# Patient Record
Sex: Female | Born: 1940 | Race: White | Hispanic: No | Marital: Married | Smoking: Never smoker
Health system: Southern US, Community
[De-identification: ages and names within clinical notes are randomized; demographics above are authoritative.]

## PROBLEM LIST (undated history)

## (undated) DIAGNOSIS — I739 Peripheral vascular disease, unspecified: Secondary | ICD-10-CM

## (undated) DIAGNOSIS — I421 Obstructive hypertrophic cardiomyopathy: Secondary | ICD-10-CM

## (undated) DIAGNOSIS — E039 Hypothyroidism, unspecified: Secondary | ICD-10-CM

## (undated) DIAGNOSIS — I779 Disorder of arteries and arterioles, unspecified: Secondary | ICD-10-CM

## (undated) DIAGNOSIS — E785 Hyperlipidemia, unspecified: Secondary | ICD-10-CM

## (undated) DIAGNOSIS — Z9289 Personal history of other medical treatment: Secondary | ICD-10-CM

## (undated) DIAGNOSIS — R011 Cardiac murmur, unspecified: Secondary | ICD-10-CM

## (undated) DIAGNOSIS — R413 Other amnesia: Secondary | ICD-10-CM

## (undated) DIAGNOSIS — I10 Essential (primary) hypertension: Secondary | ICD-10-CM

## (undated) DIAGNOSIS — I340 Nonrheumatic mitral (valve) insufficiency: Secondary | ICD-10-CM

## (undated) HISTORY — DX: Other amnesia: R41.3

## (undated) HISTORY — DX: Nonrheumatic mitral (valve) insufficiency: I34.0

## (undated) HISTORY — PX: COLONOSCOPY: SHX174

## (undated) HISTORY — DX: Obstructive hypertrophic cardiomyopathy: I42.1

## (undated) HISTORY — DX: Essential (primary) hypertension: I10

## (undated) HISTORY — PX: ABDOMINAL HYSTERECTOMY: SHX81

## (undated) HISTORY — PX: BREAST SURGERY: SHX581

## (undated) HISTORY — DX: Disorder of arteries and arterioles, unspecified: I77.9

## (undated) HISTORY — DX: Hyperlipidemia, unspecified: E78.5

## (undated) HISTORY — DX: Personal history of other medical treatment: Z92.89

## (undated) HISTORY — PX: BRAIN SURGERY: SHX531

## (undated) HISTORY — DX: Hypothyroidism, unspecified: E03.9

## (undated) HISTORY — PX: SINUS EXPLORATION: SHX5214

## (undated) HISTORY — DX: Peripheral vascular disease, unspecified: I73.9

---

## 1970-05-28 HISTORY — PX: OTHER SURGICAL HISTORY: SHX169

## 1999-06-18 ENCOUNTER — Encounter: Payer: Self-pay | Admitting: *Deleted

## 1999-06-18 ENCOUNTER — Emergency Department (HOSPITAL_COMMUNITY): Admission: EM | Admit: 1999-06-18 | Discharge: 1999-06-18 | Payer: Self-pay | Admitting: Emergency Medicine

## 1999-06-18 ENCOUNTER — Encounter (INDEPENDENT_AMBULATORY_CARE_PROVIDER_SITE_OTHER): Payer: Self-pay | Admitting: *Deleted

## 2000-01-10 ENCOUNTER — Encounter: Payer: Self-pay | Admitting: Internal Medicine

## 2000-01-26 ENCOUNTER — Other Ambulatory Visit: Admission: RE | Admit: 2000-01-26 | Discharge: 2000-01-26 | Payer: Self-pay | Admitting: Internal Medicine

## 2000-01-26 ENCOUNTER — Encounter (INDEPENDENT_AMBULATORY_CARE_PROVIDER_SITE_OTHER): Payer: Self-pay | Admitting: *Deleted

## 2000-01-26 ENCOUNTER — Encounter (INDEPENDENT_AMBULATORY_CARE_PROVIDER_SITE_OTHER): Payer: Self-pay

## 2000-01-26 ENCOUNTER — Encounter: Payer: Self-pay | Admitting: Internal Medicine

## 2000-04-11 ENCOUNTER — Encounter: Payer: Self-pay | Admitting: Cardiology

## 2000-04-11 ENCOUNTER — Encounter: Admission: RE | Admit: 2000-04-11 | Discharge: 2000-04-11 | Payer: Self-pay | Admitting: Cardiology

## 2004-06-09 ENCOUNTER — Other Ambulatory Visit: Admission: RE | Admit: 2004-06-09 | Discharge: 2004-06-09 | Payer: Self-pay | Admitting: Obstetrics and Gynecology

## 2004-06-27 ENCOUNTER — Encounter: Admission: RE | Admit: 2004-06-27 | Discharge: 2004-06-27 | Payer: Self-pay | Admitting: Cardiology

## 2004-09-22 ENCOUNTER — Ambulatory Visit (HOSPITAL_COMMUNITY): Admission: RE | Admit: 2004-09-22 | Discharge: 2004-09-22 | Payer: Self-pay | Admitting: Gastroenterology

## 2004-09-22 ENCOUNTER — Encounter (INDEPENDENT_AMBULATORY_CARE_PROVIDER_SITE_OTHER): Payer: Self-pay | Admitting: *Deleted

## 2006-07-04 ENCOUNTER — Encounter: Admission: RE | Admit: 2006-07-04 | Discharge: 2006-07-04 | Payer: Self-pay | Admitting: Cardiology

## 2006-10-10 HISTORY — PX: US ECHOCARDIOGRAPHY: HXRAD669

## 2007-03-06 HISTORY — PX: CARDIOVASCULAR STRESS TEST: SHX262

## 2009-07-04 ENCOUNTER — Encounter: Admission: RE | Admit: 2009-07-04 | Discharge: 2009-07-04 | Payer: Self-pay | Admitting: Cardiology

## 2009-12-13 ENCOUNTER — Encounter: Payer: Self-pay | Admitting: Internal Medicine

## 2010-01-09 ENCOUNTER — Ambulatory Visit: Payer: Self-pay | Admitting: Cardiology

## 2010-01-12 ENCOUNTER — Ambulatory Visit: Payer: Self-pay | Admitting: Cardiology

## 2010-01-16 ENCOUNTER — Encounter (INDEPENDENT_AMBULATORY_CARE_PROVIDER_SITE_OTHER): Payer: Self-pay | Admitting: *Deleted

## 2010-04-17 ENCOUNTER — Ambulatory Visit: Payer: Self-pay | Admitting: Internal Medicine

## 2010-04-17 DIAGNOSIS — E11649 Type 2 diabetes mellitus with hypoglycemia without coma: Secondary | ICD-10-CM | POA: Insufficient documentation

## 2010-04-17 DIAGNOSIS — K644 Residual hemorrhoidal skin tags: Secondary | ICD-10-CM | POA: Insufficient documentation

## 2010-04-17 DIAGNOSIS — E119 Type 2 diabetes mellitus without complications: Secondary | ICD-10-CM

## 2010-05-09 ENCOUNTER — Ambulatory Visit: Payer: Self-pay | Admitting: Internal Medicine

## 2010-05-25 ENCOUNTER — Ambulatory Visit: Payer: Self-pay | Admitting: Cardiology

## 2010-05-30 ENCOUNTER — Ambulatory Visit: Payer: Self-pay | Admitting: Cardiology

## 2010-06-29 NOTE — Op Note (Signed)
Summary: EGD-Dr. Madilyn Fireman  Tina [name], Tina [name]                 ACCOUNT NO.:  [id number]   MEDICAL RECORD NO.:  [id number]          PATIENT TYPE:  AMB   LOCATION:  ENDO                         FACILITY:  [facility]   PHYSICIAN:  John C. Madilyn Fireman, M.D.    DATE OF BIRTH:  30-Dec-1940   DATE OF PROCEDURE:  09/22/2004  DATE OF DISCHARGE:                                 OPERATIVE REPORT   PROCEDURE:  Esophagogastroduodenoscopy with esophageal dilatation.   INDICATIONS FOR PROCEDURE:  Solid food dysphagia in a patient also  undergoing screening colonoscopy today.   PROCEDURE:  The patient was placed in the left lateral decubitus position  and placed on the pulse monitor with continuous low flow oxygen delivered by  nasal cannula.  She was sedated with 60 mcg IV fentanyl and 6 mg IV Versed.  The Olympus video endoscope was advanced under direct vision into the  oropharynx and esophagus.  The esophagus was straight and of normal caliber  with the squamocolumnar line at 38 cm.  There was a widely patent but  clearly visible lower esophageal ring.  I did not discern any definite  hiatal hernia or esophageal ulcer or any evidence of Barrett's esophagus or  any significant length of stricture.  The stomach was entered, and a small  amount of liquid secretions were suctioned from the fundus.  Retroflexed  view of the cardia was unremarkable.  The fundus, body, antrum, and pylorus  all appeared normal.  The duodenum was entered.  Both bulb and second  portions were well inspected and appeared to be within normal limits.  The  Savary guidewire was placed through the endoscope channel and the scope  withdrawn.  A single Savary dilator of 17 mm was passed over the guidewire  with minimal resistance and no blood seen on withdrawal.  The dilator was  removed together with the wire and the patient prepared for colonoscopy.  She tolerated the procedure well, and there were no immediate complications.   IMPRESSION:   Lower esophageal ring, otherwise normal study.  Status post  dilatation to 17 mm.   PLAN:  Advance diet and observe response to dilatation.      JCH/MEDQ  D:  09/22/2004  T:  09/22/2004  Job:  [id number]   cc:   Miguel Aschoff, M.D.  [address], [address]  [place]  [place] [address]  Fax: [phone]

## 2010-06-29 NOTE — Procedures (Signed)
Summary: Colonoscopy  Patient: Tina [name] Note: All result statuses are Final unless otherwise noted.  Tests: (1) Colonoscopy (COL)   COL Colonoscopy           DONE     [facility]     [address]     [place], [place]  [address]          COLONOSCOPY PROCEDURE REPORT          PATIENT:  [name], Tina  MR#:  [id number]     BIRTHDATE:  12/17/1940, 69 yrs. old  GENDER:  female     ENDOSCOPIST:  John N. Perry Jr, MD     REF. BY:  Screening Recall, M.D.     PROCEDURE DATE:  05/09/2010     PROCEDURE:  Surveillance Colonoscopy     ASA CLASS:  Class II     INDICATIONS:  history of polyps, surveillance and high-risk     screening ; 2001 w/ hpp; negative 2006 (elsewhere)     MEDICATIONS:   Fentanyl 100 mcg IV, Versed 10 mg IV          DESCRIPTION OF PROCEDURE:   After the risks benefits and     alternatives of the procedure were thoroughly explained, informed     consent was obtained.  Digital rectal exam was performed and     revealed no abnormalities.   The LB 180AL [id number] endoscope was     introduced through the anus and advanced to the cecum, which was     identified by both the appendix and ileocecal valve, without     limitations.Time to cecum = 2:31 min.  The quality of the prep was     excellent, using MoviPrep.  The instrument was then slowly     withdrawn (time = 8:29 min) as the colon was fully examined.     <<PROCEDUREIMAGES>>          FINDINGS:  A normal appearing cecum, ileocecal valve, and     appendiceal orifice were identified. The ascending, hepatic     flexure, transverse, splenic flexure, descending, sigmoid colon,     and rectum appeared unremarkable.   Retroflexed views in the     rectum revealed no abnormalities.    The scope was then withdrawn     from the patient and the procedure completed.          COMPLICATIONS:  None          ENDOSCOPIC IMPRESSION:     1) Normal colonoscopy     2) No polyps seen          RECOMMENDATIONS:     1)  Continue current colorectal screening recommendations for     "routine risk" patients with a repeat colonoscopy in 10 years.     ______________________________     John N. Perry Jr, MD          CC:  Thomas Brackbill, MD;  The Patient          n.     eSIGNED:   John N. Perry Jr at 05/09/2010 04:11 PM          Encinas, Frederick, [id number]  Note: An exclamation mark (!) indicates a result that was not dispersed into the flowsheet. Document Creation Date: 05/09/2010 4:11 PM _______________________________________________________________________  (1) Order result status: Final Collection or observation date-time: 05/09/2010 16:06 Requested date-time:  Receipt date-time:  Reported date-time:  Referring Physician:   Ordering Physician: John Perry Jr (  [id number]) Specimen Source:  Source: EndoProS Filler Order Number: [id number] Lab site:   Appended Document: Colonoscopy    Clinical Lists Changes  Observations: Added new observation of COLONNXTDUE: 04/2020 (05/09/2010 16:44)     

## 2010-06-29 NOTE — Letter (Signed)
Summary: New Patient letter  [facility]  [address] [place], [place] [address]   Phone: [phone]  Fax: [phone]       01/16/2010 MRN: [id number]  Tina [name] [address] [place], [place]  [address]  [place]  Dear Ms. Yetta Barre,  Welcome to the Gastroenterology Division at [facility].    You are scheduled to see Dr. Marina Goodell on 03/02/2010 at 2:30PM on the 3rd floor at [facility], [address]  We ask that you try to arrive at our   office 15 minutes prior to your appointment time to allow for check-in.  We would like you to complete the enclosed self-administered evaluation form prior to your visit and bring it with you on the day of your appointment.  We will review it with you.  Also, please bring a complete list of all your medications or, if you prefer, bring the medication bottles and we will list them.  Please bring your insurance card so that we may make a copy of it.  If your insurance requires a referral to see a specialist, please bring your referral form from your primary care physician.  Co-payments are due at the time of your visit and may be paid by cash, check or credit card.     Your office visit will consist of a consult with your physician (includes a physical exam), any laboratory testing he/she may order, scheduling of any necessary diagnostic testing (e.g. x-ray, ultrasound, CT-scan), and scheduling of a procedure (e.g. Endoscopy, Colonoscopy) if required.  Please allow enough time on your schedule to allow for any/all of these possibilities.    If you cannot keep your appointment, please call [phone] to cancel or reschedule prior to your appointment date.  This allows [place] the opportunity to schedule an appointment for another patient in need of care.  If you do not cancel or reschedule by 5 p.m. the business day prior to your appointment date, you will be charged a $50.00 late cancellation/no-show fee.    Thank you for  choosing [facility] for your medical needs.  We appreciate the opportunity to care for you.  Please visit [place] at our website  to learn more about our practice.                     Sincerely,                                                             The Gastroenterology Division

## 2010-06-29 NOTE — Op Note (Signed)
Summary: Colonoscopy-Dr. Madilyn Fireman  NAME:  Tina [name], Tina [name]                 ACCOUNT NO.:  [id number]   MEDICAL RECORD NO.:  [id number]          PATIENT TYPE:  AMB   LOCATION:  ENDO                         FACILITY:  [facility]   PHYSICIAN:  John C. Madilyn Fireman, M.D.    DATE OF BIRTH:  [birth year]   DATE OF PROCEDURE:  09/22/2004  DATE OF DISCHARGE:                                 OPERATIVE REPORT   PROCEDURE:  Colonoscopy.   INDICATIONS FOR PROCEDURE:  Average-risk colon cancer screening.   PROCEDURE:  The patient was placed in the left lateral decubitus position  and placed on the pulse monitor with continuous low flow oxygen delivered by  nasal cannula.  She was sedated with 60 mcg IV fentanyl and 6 mg IV Versed  for the previous study and received an additional 1 mg for this procedure.  The Olympus video colonoscope was inserted into the rectum and advanced to  the cecum, confirmed by transillumination at McBurney's point and  visualization of the ileocecal valve and appendiceal orifice.  Prep is  excellent.  The cecum, ascending, transverse, descending, and sigmoid colon  all appeared normal with no masses, polyps, diverticula, or other mucosal  abnormalities.  The rectum likewise appeared normal, and retroflexed view of  the anus revealed no obvious internal hemorrhoids.  The scope was then  withdrawn, and the patient returned to the recovery room in stable  condition.  She tolerated the procedure well, and there were no immediate  complications.   IMPRESSION:  Normal study.   PLAN:  Flexible sigmoidoscopy in five years.  Colonoscopy in 10 years.      JCH/MEDQ  D:  09/22/2004  T:  09/22/2004  Job:  [id number]   cc:   Miguel Aschoff, M.D.  [address]  [place]  [place] [address]  Fax: [phone]

## 2010-06-29 NOTE — Letter (Signed)
Summary: Colonoscopy Letter  [facility]  [address] [place], [place] [address]   Phone: [phone]  Fax: [phone]      December 13, 2009 MRN: [id number]   Tina [name] [address] [place], [place]  [address]   Dear Tina [name],   According to your medical record, it is time for you to schedule a Colonoscopy. The American Cancer Society recommends this procedure as a method to detect early colon cancer. Patients with a family history of colon cancer, or a personal history of colon polyps or inflammatory bowel disease are at increased risk.  This letter has been generated based on the recommendations made at the time of your procedure. If you feel that in your particular situation this may no longer apply, please contact our office.  Please call our office at [phone] to schedule this appointment or to update your records at your earliest convenience.  Thank you for cooperating with [place] to provide you with the very best care possible.   Sincerely,  Wilhemina Bonito. Marina Goodell, M.D.  [facility] Division [phone]

## 2010-06-29 NOTE — Procedures (Signed)
Summary: colonoscopy   Colonoscopy  Procedure date:  01/26/2000  Findings:      Location:  [facility].  Results: Diverticulosis.       Pathology:  Hyperplastic polyp.      Patient Name: Tina [name], Tina [name] MRN: [id number] Procedure Procedures: Colonoscopy CPT: 04540.    with Hot Biopsy(s)CPT: Z451292.  Personnel: Endoscopist: Wilhemina Bonito. Marina Goodell, MD.  Referred By: Cassell Clement, MD.  Exam Location: Exam performed in [facility]. Outpatient  Patient Consent: Procedure, Alternatives, Risks and Benefits discussed, consent obtained, from patient.  Indications Symptoms: Back pain.  Average Risk Screening Routine.  History  Pre-Exam Physical: Performed Jan 26, 2000. Cardio-pulmonary exam, Rectal exam, HEENT exam , Abdominal exam, Extremity exam, Neurological exam WNL.  Exam Exam: Extent of exam reached: Cecum, extent intended: Terminal Ileum.  The cecum was identified by appendiceal orifice and IC valve. Patient position: on left side. Colon retroflexion performed. Images were not taken. ASA Classification: I.  Monitoring: Pulse and BP monitoring, Oximetry used. Supplemental O2 given.  Fluoroscopy: Fluoroscopy was not used.  Sedation Meds: Fentanyl 100 mcg. Versed 10 mg.  Findings DIVERTICULOSIS: Sigmoid Colon. ICD9: Diverticulosis: 562.10.  POLYP: Sigmoid Colon, diminutive, Procedure:  hot biopsy, removed, retrieved, Polyp sent to pathology. ICD9: Colon Polyps: 211.3.   Assessment  Diagnoses: 211.3: Colon Polyps.  562.10: Diverticulosis.   Events  Unplanned Interventions: No intervention was required.  Unplanned Events: There were no complications. Plans Medication Plan: Await pathology.  Patient Education: Patient given standard instructions for: Polyps. Diverticulosis.  Comments: Follow  pathology. Return to Dr. Patty Sermons re medical care and back pain. Disposition: After procedure patient sent to recovery. After recovery patient sent home.    Scheduling/Referral: Await pathology to schedule patient.   This report was created from the original endoscopy report, which was reviewed and signed by the above listed endoscopist.   cc:  Cassell Clement, MD

## 2010-06-29 NOTE — Letter (Signed)
Summary: [facility] : [facility] Instructions  [facility]  [address] [place], [place] [address]   Phone: [phone]  Fax: [phone]       Tina [name]    [birth year]    MRN: [id number]        Procedure Day /Date:TUESDAY 05/09/10     Arrival Time:2:30 PM     Procedure Time:3:30 PM     Location of Procedure:                    X  [facility] (4th Floor)               PREPARATION FOR COLONOSCOPY WITH MOVIPREP   Starting 5 days prior to your procedure12/8/11 do not eat nuts, seeds, popcorn, corn, beans, peas,  salads, or any raw vegetables.  Do not take any fiber supplements (e.g. Metamucil, Citrucel, and Benefiber).  THE DAY BEFORE YOUR PROCEDURE         DATE:05/08/10 DAY: MONDAY  1.  Drink clear liquids the entire day-NO SOLID FOOD  2.  Do not drink anything colored red or purple.  Avoid juices with pulp.  No orange juice.  3.  Drink at least 64 oz. (8 glasses) of fluid/clear liquids during the day to prevent dehydration and help the prep work efficiently.  CLEAR LIQUIDS INCLUDE: Water Jello Ice Popsicles Tea (sugar ok, no milk/cream) Powdered fruit flavored drinks Coffee (sugar ok, no milk/cream) Gatorade Juice: apple, white grape, white cranberry  Lemonade Clear bullion, consomm, broth Carbonated beverages (any kind) Strained chicken noodle soup Hard Candy                             4.  In the morning, mix first dose of MoviPrep solution:    Empty 1 Pouch A and 1 Pouch B into the disposable container    Add lukewarm drinking water to the top line of the container. Mix to dissolve    Refrigerate (mixed solution should be used within 24 hrs)  5.  Begin drinking the prep at 5:00 p.m. The MoviPrep container is divided by 4 marks.   Every 15 minutes drink the solution down to the next mark (approximately 8 oz) until the full liter is complete.   6.  Follow completed prep with 16 oz of clear liquid of your choice (Nothing red or purple).   Continue to drink clear liquids until bedtime.  7.  Before going to bed, mix second dose of MoviPrep solution:    Empty 1 Pouch A and 1 Pouch B into the disposable container    Add lukewarm drinking water to the top line of the container. Mix to dissolve    Refrigerate  THE DAY OF YOUR PROCEDURE      DATE:05/09/10 DAY: TUESDAY  Beginning at10:30 a.m. (5 hours before procedure):         1. Every 15 minutes, drink the solution down to the next mark (approx 8 oz) until the full liter is complete.  2. Follow completed prep with 16 oz. of clear liquid of your choice.    3. You may drink clear liquids until 1:30 PM (2 HOURS BEFORE PROCEDURE).   MEDICATION INSTRUCTIONS  Unless otherwise instructed, you should take regular prescription medications with a small sip of water   as early as possible the morning of your procedure.  Diabetic patients - see separate instructions.         OTHER  INSTRUCTIONS  You will need a responsible adult at least 72 years of age to accompany you and drive you home.   This person must remain in the waiting room during your procedure.  Wear loose fitting clothing that is easily removed.  Leave jewelry and other valuables at home.  However, you may wish to bring a book to read or  an iPod/MP3 player to listen to music as you wait for your procedure to start.  Remove all body piercing jewelry and leave at home.  Total time from sign-in until discharge is approximately 2-3 hours.  You should go home directly after your procedure and rest.  You can resume normal activities the  day after your procedure.  The day of your procedure you should not:   Drive   Make legal decisions   Operate machinery   Drink alcohol   Return to work  You will receive specific instructions about eating, activities and medications before you leave.    The above instructions have been reviewed and explained to me by   _______________________    I fully  understand and can verbalize these instructions _____________________________ Date _________

## 2010-06-29 NOTE — Assessment & Plan Note (Signed)
Summary: Hemorrhoids, colonoscopy followup   History of Present Illness Visit Type: Follow-up Visit Primary GI MD: Yancey Flemings MD Primary Zaria Taha: Cassell Clement, MD Chief Complaint: hemmorhoids,better with metamucil History of Present Illness:   70 year old female with hypertension, hyperlipidemia,and diabetes mellitus. She presents today regarding colonoscopy. Her index colonoscopy was performed August 2001. This was normal except for diminutive colon polyp which was removed and found to be hyperplastic. She had followup elsewhere around April 2006. This was normal. She recently has had problems with loose stools which have resolved. Also problems with her hemorrhoids as manifested by pain. She has treated this with fiber supplementation in the form of Metamucil. Review of outside blood work from August 2011 shows a hemoglobin A1c of 7.5 with normal liver tests and electrolytes. Elevated cholesterol at 215. She denies family history of colon cancer or bleeding.   GI Review of Systems      Denies abdominal pain, acid reflux, belching, bloating, chest pain, dysphagia with liquids, dysphagia with solids, heartburn, loss of appetite, nausea, vomiting, vomiting blood, weight loss, and  weight gain.      Reports hemorrhoids.     Denies anal fissure, black tarry stools, change in bowel habit, constipation, diarrhea, diverticulosis, fecal incontinence, heme positive stool, irritable bowel syndrome, jaundice, light color stool, liver problems, rectal bleeding, and  rectal pain. Preventive Screening-Counseling & Management  Alcohol-Tobacco     Smoking Status: never      Drug Use:  no.      Current Medications (verified): 1)  Losartan Potassium-Hctz 100-25 Mg Tabs (Losartan Potassium-Hctz) .... Take 1 Tablet By Mouth Once Daily 2)  Synthroid 100 Mcg Tabs (Levothyroxine Sodium) .... Take 1 Tablet By Mouth Once Daily 3)  Potassium Gluconate 2 Meq Tabs (Potassium Gluconate) .... Once Daily 4)   Glimepiride 4 Mg Tabs (Glimepiride) .... Take 1/2 Tablet By Mouth As Needed  Allergies (verified): No Known Drug Allergies  Past History:  Past Medical History: Adenomatous Colon Polyps Diabetes Hyperlipidemia Hypertension  Past Surgical History: Appendectomy Breast-Lumpectomy Hysterectomy Thyroid Surgery  Family History: Family History of Colon Polyps: Family History of Diabetes:  Family History of Heart Disease:   Social History: Occupation: Retired Patient has never smoked.  Alcohol Use - no Illicit Drug Use - no Smoking Status:  never Drug Use:  no  Review of Systems       The patient complains of headaches-new and heart murmur.  The patient denies anemia, anxiety-new, arthritis/joint pain, back pain, blood in urine, breast changes/lumps, change in vision, confusion, cough, coughing up blood, depression-new, fainting, fatigue, fever, hearing problems, heart rhythm changes, itching, menstrual pain, muscle pains/cramps, night sweats, nosebleeds, pregnancy symptoms, shortness of breath, skin rash, sleeping problems, sore throat, swelling of feet/legs, swollen lymph glands, thirst - excessive , urination - excessive , urination changes/pain, urine leakage, vision changes, and voice change.    Vital Signs:  Patient profile:   70 year old female Height:      62 inches Weight:      163.25 pounds BMI:     29.97 Pulse rate:   68 / minute Pulse rhythm:   regular BP sitting:   162 / 86  (left arm) Cuff size:   regular  Vitals Entered By: June McMurray CMA Duncan Dull) (April 17, 2010 9:29 AM)  Physical Exam  General:  Well developed, well nourished, no acute distress. Head:  Normocephalic and atraumatic. Eyes:  PERRLA, no icterus. Nose:  No deformity, discharge,  or lesions. Mouth:  No deformity or  lesions, Neck:  Supple; no masses or thyromegaly. Lungs:  Clear throughout to auscultation. Heart:  Regular rate and rhythm; no murmurs, rubs,  or bruits. Abdomen:  Soft,  obese,nontender and nondistended. No masses, hepatosplenomegaly or hernias noted. Normal bowel sounds. Rectal:  deferred until colonoscopy Msk:  Symmetrical with no gross deformities. Normal posture. Pulses:  Normal pulses noted. Extremities:  no edema Neurologic:  Alert and  oriented x4. Skin:  no jaundice Psych:  Alert and cooperative. Normal mood and affect.   Impression & Recommendations:  Problem # 1:  SCREENING COLORECTAL-CANCER (ICD-V76.51) patient presents today for followup colonoscopy. Index exam in 2001 revealing hyperplastic polyp. Followup exam in 2006 without lesions. The major colonoscopy as well as risks, benefits, and alternatives were reviewed. She understood and agreed to proceed. Movi prep prescribed. Patient instructed on its use  Problem # 2:  DM (ICD-250.00)  hold diabetic medication the day of the procedure to avoid hypoglycemia. Monitor blood sugar immediately before and after the procedure  Orders: Colonoscopy (Colon)  Problem # 3:  HEMORRHOIDS-EXTERNAL (ICD-455.3) smptomatic hemorrhoids. Reportedly better with fiber. Continue fiber. Evaluate at time of colonoscopy.  Patient Instructions: 1)  COLONOSCOPY [facility] 05/09/10 3:30 PM ARRIVE AT 2:30 PM 2)  MOVI PREP INSTRUCTIONS AND RX. SENT TO PHARMACY 3)  Colonoscopy and Flexible Sigmoidoscopy brochure given.  4)  Copy sent to : Cassell Clement, MD 5)  The medication list was reviewed and reconciled.  All changed / newly prescribed medications were explained.  A complete medication list was provided to the patient / caregiver. Prescriptions: MOVIPREP 100 GM  SOLR (PEG-KCL-NACL-NASULF-NA ASC-C) As per prep instructions.  #1 x 0   Entered by:   Milford Cage NCMA   Authorized by:   Hilarie Fredrickson MD   Signed by:   Milford Cage NCMA on 04/17/2010   Method used:   Electronically to        [facility]  [id number] ([facility]       [address]       [place], [place]  [address]       Ph:  [phone] or [phone]       Fax: [phone]   RxID:   [id number]

## 2010-06-30 NOTE — Progress Notes (Signed)
Summary: [facility]  [facility]   Imported By: Sherian Rein 04/18/2010 07:12:18  _____________________________________________________________________  External Attachment:    Type:   Image     Comment:   External Document

## 2010-08-15 NOTE — Procedures (Signed)
Summary: Colonoscopy  Patient: Tina [name] Note: All result statuses are Final unless otherwise noted.  Tests: (1) Colonoscopy (COL)   COL Colonoscopy           DONE     [facility]     [address]     [place], [place]  [address]          COLONOSCOPY PROCEDURE REPORT          PATIENT:  Tina [name], Tina [name]  MR#:  [id number]     BIRTHDATE:  1940-07-20, 69 yrs. old  GENDER:  female     ENDOSCOPIST:  Wilhemina Bonito. Eda Keys, MD     REF. BY:  Screening Recall, M.D.     PROCEDURE DATE:  05/09/2010     PROCEDURE:  Surveillance Colonoscopy     ASA CLASS:  Class II     INDICATIONS:  history of polyps, surveillance and high-risk     screening ; 2001 w/ hpp; negative 2006 (elsewhere)     MEDICATIONS:   Fentanyl 100 mcg IV, Versed 10 mg IV          DESCRIPTION OF PROCEDURE:   After the risks benefits and     alternatives of the procedure were thoroughly explained, informed     consent was obtained.  Digital rectal exam was performed and     revealed no abnormalities.   The LB 180AL [id number] endoscope was     introduced through the anus and advanced to the cecum, which was     identified by both the appendix and ileocecal valve, without     limitations.Time to cecum = 2:31 min.  The quality of the prep was     excellent, using MoviPrep.  The instrument was then slowly     withdrawn (time = 8:29 min) as the colon was fully examined.     <<PROCEDUREIMAGES>>          FINDINGS:  A normal appearing cecum, ileocecal valve, and     appendiceal orifice were identified. The ascending, hepatic     flexure, transverse, splenic flexure, descending, sigmoid colon,     and rectum appeared unremarkable.   Retroflexed views in the     rectum revealed no abnormalities.    The scope was then withdrawn     from the patient and the procedure completed.          COMPLICATIONS:  None          ENDOSCOPIC IMPRESSION:     1) Normal colonoscopy     2) No polyps seen          RECOMMENDATIONS:     1)  Continue current colorectal screening recommendations for     "routine risk" patients with a repeat colonoscopy in 10 years.     ______________________________     Wilhemina Bonito. Eda Keys, MD          CC:  Cassell Clement, MD;  The Patient          n.     eSIGNED:   Wilhemina Bonito. Eda Keys at 05/09/2010 04:11 PM          Patton Salles, [id number]  Note: An exclamation mark (!) indicates a result that was not dispersed into the flowsheet. Document Creation Date: 05/09/2010 4:11 PM _______________________________________________________________________  (1) Order result status: Final Collection or observation date-time: 05/09/2010 16:06 Requested date-time:  Receipt date-time:  Reported date-time:  Referring Physician:   Ordering Physician: Fransico Setters (  [id number]) Specimen Source:  Source: Launa Grill Order Number: [id number] Lab site:   Appended Document: Colonoscopy    Clinical Lists Changes  Observations: Added new observation of COLONNXTDUE: 04/2020 (05/09/2010 16:44)

## 2010-10-13 NOTE — Op Note (Signed)
NAME:  Tina [name], Tina [name]                 ACCOUNT NO.:  [id number]   MEDICAL RECORD NO.:  [id number]          PATIENT TYPE:  AMB   LOCATION:  ENDO                         FACILITY:  [facility]   PHYSICIAN:  John C. Madilyn Fireman, M.D.    DATE OF BIRTH:  02-14-1941   DATE OF PROCEDURE:  09/22/2004  DATE OF DISCHARGE:                                 OPERATIVE REPORT   PROCEDURE:  Esophagogastroduodenoscopy with esophageal dilatation.   INDICATIONS FOR PROCEDURE:  Solid food dysphagia in a patient also  undergoing screening colonoscopy today.   PROCEDURE:  The patient was placed in the left lateral decubitus position  and placed on the pulse monitor with continuous low flow oxygen delivered by  nasal cannula.  She was sedated with 60 mcg IV fentanyl and 6 mg IV Versed.  The Olympus video endoscope was advanced under direct vision into the  oropharynx and esophagus.  The esophagus was straight and of normal caliber  with the squamocolumnar line at 38 cm.  There was a widely patent but  clearly visible lower esophageal ring.  I did not discern any definite  hiatal hernia or esophageal ulcer or any evidence of Barrett's esophagus or  any significant length of stricture.  The stomach was entered, and a small  amount of liquid secretions were suctioned from the fundus.  Retroflexed  view of the cardia was unremarkable.  The fundus, body, antrum, and pylorus  all appeared normal.  The duodenum was entered.  Both bulb and second  portions were well inspected and appeared to be within normal limits.  The  Savary guidewire was placed through the endoscope channel and the scope  withdrawn.  A single Savary dilator of 17 mm was passed over the guidewire  with minimal resistance and no blood seen on withdrawal.  The dilator was  removed together with the wire and the patient prepared for colonoscopy.  She tolerated the procedure well, and there were no immediate complications.   IMPRESSION:  Lower esophageal ring,  otherwise normal study.  Status post  dilatation to 17 mm.   PLAN:  Advance diet and observe response to dilatation.      JCH/MEDQ  D:  09/22/2004  T:  09/22/2004  Job:  [id number]   cc:   Miguel Aschoff, M.D.  [address]  [place]  [place] [address]  Fax: [phone]

## 2010-10-13 NOTE — Op Note (Signed)
NAME:  Tina [name], Tina [name]                 ACCOUNT NO.:  [id number]   MEDICAL RECORD NO.:  [id number]          PATIENT TYPE:  AMB   LOCATION:  ENDO                         [facility]ACILITY:  [facility]   PHYSICIAN:  John C. Madilyn Fireman, M.D.    DATE OF BIRTH:  11-May-1941   DATE OF PROCEDURE:  09/22/2004  DATE OF DISCHARGE:                                 OPERATIVE REPORT   PROCEDURE:  Colonoscopy.   INDICATIONS FOR PROCEDURE:  Average-risk colon cancer screening.   PROCEDURE:  The patient was placed in the left lateral decubitus position  and placed on the pulse monitor with continuous low flow oxygen delivered by  nasal cannula.  She was sedated with 60 mcg IV fentanyl and 6 mg IV Versed  for the previous study and received an additional 1 mg for this procedure.  The Olympus video colonoscope was inserted into the rectum and advanced to  the cecum, confirmed by transillumination at McBurney's point and  visualization of the ileocecal valve and appendiceal orifice.  Prep is  excellent.  The cecum, ascending, transverse, descending, and sigmoid colon  all appeared normal with no masses, polyps, diverticula, or other mucosal  abnormalities.  The rectum likewise appeared normal, and retroflexed view of  the anus revealed no obvious internal hemorrhoids.  The scope was then  withdrawn, and the patient returned to the recovery room in stable  condition.  She tolerated the procedure well, and there were no immediate  complications.   IMPRESSION:  Normal study.   PLAN:  Flexible sigmoidoscopy in five years.  Colonoscopy in 10 years.      JCH/MEDQ  D:  09/22/2004  T:  09/22/2004  Job:  [id number]   cc:   Miguel Aschoff, M.D.  [address]  [place]  [place] [address]  Fax: [phone]

## 2010-11-01 ENCOUNTER — Other Ambulatory Visit: Payer: Self-pay | Admitting: *Deleted

## 2010-11-01 DIAGNOSIS — E039 Hypothyroidism, unspecified: Secondary | ICD-10-CM

## 2010-11-01 DIAGNOSIS — I10 Essential (primary) hypertension: Secondary | ICD-10-CM

## 2010-11-01 MED ORDER — SYNTHROID 100 MCG PO TABS: 100.0000 ug | ORAL_TABLET | Freq: Every day | ORAL | Status: DC

## 2010-11-01 MED ORDER — LOSARTAN POTASSIUM-HCTZ 100-25 MG PO TABS: 1.0000 | ORAL_TABLET | Freq: Every day | ORAL | Status: DC

## 2010-11-01 NOTE — Telephone Encounter (Signed)
Refilled meds per fax request.  

## 2010-12-01 ENCOUNTER — Other Ambulatory Visit: Payer: Self-pay | Admitting: *Deleted

## 2010-12-01 DIAGNOSIS — E785 Hyperlipidemia, unspecified: Secondary | ICD-10-CM

## 2010-12-04 ENCOUNTER — Encounter: Payer: Self-pay | Admitting: Cardiology

## 2010-12-07 ENCOUNTER — Other Ambulatory Visit: Payer: Self-pay | Admitting: Cardiology

## 2010-12-07 ENCOUNTER — Other Ambulatory Visit (INDEPENDENT_AMBULATORY_CARE_PROVIDER_SITE_OTHER): Payer: Medicare Other | Admitting: *Deleted

## 2010-12-07 DIAGNOSIS — E785 Hyperlipidemia, unspecified: Secondary | ICD-10-CM

## 2010-12-07 LAB — HEPATIC FUNCTION PANEL
ALT: 17 U/L (ref 0–35)
Alkaline Phosphatase: 64 U/L (ref 39–117)
Bilirubin, Direct: 0.1 mg/dL (ref 0.0–0.3)
Total Bilirubin: 0.5 mg/dL (ref 0.3–1.2)
Total Protein: 6.7 g/dL (ref 6.0–8.3)

## 2010-12-07 LAB — BASIC METABOLIC PANEL
CO2: 30 mEq/L (ref 19–32)
Chloride: 97 mEq/L (ref 96–112)
Sodium: 138 mEq/L (ref 135–145)

## 2010-12-07 LAB — LIPID PANEL
HDL: 73.4 mg/dL (ref >39.00)
Total CHOL/HDL Ratio: 3
Triglycerides: 66 mg/dL (ref 0.0–149.0)

## 2010-12-07 LAB — LDL CHOLESTEROL, DIRECT: Direct LDL: 129.5 mg/dL

## 2010-12-08 ENCOUNTER — Other Ambulatory Visit: Payer: Self-pay | Admitting: *Deleted

## 2010-12-11 ENCOUNTER — Encounter: Payer: Self-pay | Admitting: Cardiology

## 2010-12-11 ENCOUNTER — Ambulatory Visit (INDEPENDENT_AMBULATORY_CARE_PROVIDER_SITE_OTHER): Payer: Medicare Other | Admitting: Cardiology

## 2010-12-11 VITALS — BP 130/76 | HR 80 | Wt 158.0 lb

## 2010-12-11 DIAGNOSIS — I119 Hypertensive heart disease without heart failure: Secondary | ICD-10-CM | POA: Insufficient documentation

## 2010-12-11 DIAGNOSIS — I359 Nonrheumatic aortic valve disorder, unspecified: Secondary | ICD-10-CM | POA: Insufficient documentation

## 2010-12-11 DIAGNOSIS — E785 Hyperlipidemia, unspecified: Secondary | ICD-10-CM

## 2010-12-11 DIAGNOSIS — J019 Acute sinusitis, unspecified: Secondary | ICD-10-CM | POA: Insufficient documentation

## 2010-12-11 DIAGNOSIS — E119 Type 2 diabetes mellitus without complications: Secondary | ICD-10-CM

## 2010-12-11 DIAGNOSIS — E039 Hypothyroidism, unspecified: Secondary | ICD-10-CM | POA: Insufficient documentation

## 2010-12-11 MED ORDER — AMOXICILLIN 500 MG PO CAPS: 500.0000 mg | ORAL_CAPSULE | Freq: Three times a day (TID) | ORAL | Status: AC

## 2010-12-11 NOTE — Progress Notes (Signed)
[name] Date of Birth:  Feb 04, 1941 [facility] [address]   [address] [place], [place]  [address] [phone]           Fax   [phone]  History of Present Illness: This pleasant 70 year old woman is seen for a scheduled two-month followup office visit.  She has a history of essential hypertension, diabetes mellitus, and hypothyroidism.  She is not having any hypoglycemic episodes.  She is managing her diabetes mainly with weight loss and exercise and careful diet.  She's not expressing any symptoms of chest pain or shortness of breath.  She had a normal treadmill Cardiolite stress test on 03/06/07.  She has a known heart murmur and had an echocardiogram on 10/10/06 which showed mild aortic stenosis and moderate mitral regurgitation at that time.  Current Outpatient Prescriptions  Medication Sig Dispense Refill  . aspirin 81 MG tablet Take 81 mg by mouth daily.        . fish oil-omega-3 fatty acids 1000 MG capsule Take 2 g by mouth daily. Taking occ.      Marland Kitchen glimepiride (AMARYL) 4 MG tablet Take 4 mg by mouth as needed.        Marland Kitchen losartan-hydrochlorothiazide (HYZAAR) 100-25 MG per tablet Take 1 tablet by mouth daily.  90 tablet  3  . multivitamin-iron-minerals-folic acid (CENTRUM) chewable tablet Chew 1 tablet by mouth daily.        Marland Kitchen POTASSIUM PO Take by mouth daily.        Marland Kitchen SYNTHROID 100 MCG tablet Take 1 tablet (100 mcg total) by mouth daily.  90 tablet  3  . amoxicillin (AMOXIL) 500 MG capsule Take 1 capsule (500 mg total) by mouth 3 (three) times daily.  30 capsule  3    Allergies  Allergen Reactions  . Claritin   . Lipitor (Atorvastatin Calcium)   . Metformin And Related   . Pravachol   . Zocor (Simvastatin)     Patient Active Problem List  Diagnoses  . Type II or unspecified type diabetes mellitus without mention of complication, not stated as uncontrolled  . HEMORRHOIDS-EXTERNAL  . Benign hypertensive heart disease without heart failure   . Hypothyroidism  . Aortic valve disease  . Sinusitis acute    History  Smoking status  . Never Smoker   Smokeless tobacco  . Not on file    History  Alcohol Use No    No family history on file.  Review of Systems: Constitutional: no fever chills diaphoresis or fatigue or change in weight.  Head and neck: no hearing loss, no epistaxis, no photophobia or visual disturbance. Respiratory: No cough, shortness of breath or wheezing. Cardiovascular: No chest pain peripheral edema, palpitations. Gastrointestinal: No abdominal distention, no abdominal pain, no change in bowel habits hematochezia or melena. Genitourinary: No dysuria, no frequency, no urgency, no nocturia. Musculoskeletal:No arthralgias, no back pain, no gait disturbance or myalgias. Neurological: No dizziness, no headaches, no numbness, no seizures, no syncope, no weakness, no tremors. Hematologic: No lymphadenopathy, no easy bruising. Psychiatric: No confusion, no hallucinations, no sleep disturbance.    Physical Exam: Filed Vitals:   12/11/10 1402  BP: 130/76  Pulse: 80  The general appearance reveals a well-developed well-nourished woman in no distress .The head and neck exam reveals pupils equal and reactive.  Extraocular movements are full.  There is no scleral icterus.  The mouth and pharynx are normal.  The neck is supple.  The carotids reveal no bruits.  The jugular venous pressure is normal.  The  thyroid is not enlarged.  There is no lymphadenopathy.  The chest is clear to percussion and auscultation.  There are no rales or rhonchi.  Expansion of the chest is symmetrical.  The precordium is quiet.  The first heart sound is normal.  The second heart sound is physiologically split.  There is no  gallop rub or click. There is a grade 2/6 systolic ejection murmur at the base. There is no abnormal lift or heave.  The abdomen is soft and nontender.  The bowel sounds are normal.  The liver and spleen are not  enlarged.  There are no abdominal masses.  There are no abdominal bruits.  Extremities reveal good pedal pulses.  There is no phlebitis or edema.  There is no cyanosis or clubbing.  Strength is normal and symmetrical in all extremities.  There is no lateralizing weakness.  There are no sensory deficits.  The skin is warm and dry.  There is no rash.   Assessment / Plan: Continue same medication.  Recheck in 6 months for followup office visit including lipid panel chemistries and hemoglobin A1c

## 2010-12-11 NOTE — Assessment & Plan Note (Signed)
Patient has a history of mild aortic stenosis.  She's not having any cardinal symptoms from the heart murmur

## 2010-12-11 NOTE — Assessment & Plan Note (Signed)
The patient has a history of chronic sinus problems and has been on Allegra-D and previously has used amoxicillin.  Her prescriptions have run out of we gave her a new prescription for amoxicillin to have on hand for recurrent acute sinusitis

## 2010-12-11 NOTE — Assessment & Plan Note (Signed)
The patient has a history of diabetes.  She has lost considerable amount of weight over the past several years.  As a result her diabetes has come under much better control.  She's not having any hypoglycemic episodes.  Her energy level is fair.  She has had to look after her husband who has been ill since last October.  She feels exhausted at times.  She has not lost any further weight since we last saw her several months ago.  She denies any chest pain or dizziness.  She's had no syncope.

## 2011-06-12 ENCOUNTER — Telehealth: Payer: Self-pay | Admitting: Cardiology

## 2011-06-12 DIAGNOSIS — E039 Hypothyroidism, unspecified: Secondary | ICD-10-CM

## 2011-06-12 NOTE — Telephone Encounter (Signed)
Did offer appointment with Tina [name], but wants to wait and see  Dr. Patty Sermons.  Offered appointment next week but her husband going into the hospital and she wanted to wait.  Advised to call back if she changes her mind

## 2011-06-12 NOTE — Telephone Encounter (Signed)
New Msg: Pt calling wanting to know if pt is going to have her thyroids check when pt comes in to do lab work. Pt requested that her thyroid be tested with lab work.   Pt also calling wanting to discuss with MD about some episodes of "blacking out" for a couple of seconds. Pt c/o dizziness at the time of "black outs".   Please return pt call to discuss further.

## 2011-06-21 ENCOUNTER — Other Ambulatory Visit (INDEPENDENT_AMBULATORY_CARE_PROVIDER_SITE_OTHER): Payer: Medicare Other | Admitting: *Deleted

## 2011-06-21 DIAGNOSIS — E039 Hypothyroidism, unspecified: Secondary | ICD-10-CM

## 2011-06-21 DIAGNOSIS — E785 Hyperlipidemia, unspecified: Secondary | ICD-10-CM

## 2011-06-21 DIAGNOSIS — E119 Type 2 diabetes mellitus without complications: Secondary | ICD-10-CM

## 2011-06-21 LAB — HEMOGLOBIN A1C: Hgb A1c MFr Bld: 7.9 % — ABNORMAL HIGH (ref 4.6–6.5)

## 2011-06-22 LAB — HEPATIC FUNCTION PANEL
ALT: 16 U/L (ref 0–35)
Albumin: 4.1 g/dL (ref 3.5–5.2)
Bilirubin, Direct: 0 mg/dL (ref 0.0–0.3)
Total Protein: 6.9 g/dL (ref 6.0–8.3)

## 2011-06-22 LAB — BASIC METABOLIC PANEL
BUN: 18 mg/dL (ref 6–23)
Chloride: 106 mEq/L (ref 96–112)
GFR: 77.01 mL/min (ref >60.00)
Potassium: 4.1 mEq/L (ref 3.5–5.1)
Sodium: 141 mEq/L (ref 135–145)

## 2011-06-22 LAB — LIPID PANEL
Cholesterol: 216 mg/dL — ABNORMAL HIGH (ref 0–200)
HDL: 78.2 mg/dL (ref >39.00)
Triglycerides: 68 mg/dL (ref 0.0–149.0)
VLDL: 13.6 mg/dL (ref 0.0–40.0)

## 2011-06-22 LAB — TSH: TSH: 1.23 u[IU]/mL (ref 0.35–5.50)

## 2011-06-25 ENCOUNTER — Ambulatory Visit (INDEPENDENT_AMBULATORY_CARE_PROVIDER_SITE_OTHER): Payer: Medicare Other | Admitting: Cardiology

## 2011-06-25 ENCOUNTER — Encounter: Payer: Self-pay | Admitting: Cardiology

## 2011-06-25 DIAGNOSIS — I350 Nonrheumatic aortic (valve) stenosis: Secondary | ICD-10-CM

## 2011-06-25 DIAGNOSIS — J019 Acute sinusitis, unspecified: Secondary | ICD-10-CM

## 2011-06-25 DIAGNOSIS — E039 Hypothyroidism, unspecified: Secondary | ICD-10-CM

## 2011-06-25 DIAGNOSIS — I359 Nonrheumatic aortic valve disorder, unspecified: Secondary | ICD-10-CM

## 2011-06-25 DIAGNOSIS — E7800 Pure hypercholesterolemia, unspecified: Secondary | ICD-10-CM

## 2011-06-25 DIAGNOSIS — I119 Hypertensive heart disease without heart failure: Secondary | ICD-10-CM

## 2011-06-25 DIAGNOSIS — E119 Type 2 diabetes mellitus without complications: Secondary | ICD-10-CM

## 2011-06-25 MED ORDER — AZITHROMYCIN 250 MG PO TABS: ORAL_TABLET | ORAL | Status: AC

## 2011-06-25 NOTE — Patient Instructions (Signed)
A Zpak has been sent to [facility], call back if no better or worse. Your physician wants you to follow-up in: 6 months You will receive a reminder letter in the mail two months in advance. If you don't receive a letter, please call our office to schedule the follow-up appointment.

## 2011-06-25 NOTE — Assessment & Plan Note (Signed)
The patient has not been having any symptoms referable to her blood pressure.  She denies any exertional dyspnea.  She walks regularly for exercise.  She's not having any palpitations headaches dizziness or syncope

## 2011-06-25 NOTE — Assessment & Plan Note (Signed)
The patient has adult onset diabetes.  Her hemoglobin A1c remains elevated.  He is to continue same education and worked hard on a low carbohydrate weight reduction diet.

## 2011-06-25 NOTE — Assessment & Plan Note (Signed)
The patient has been sick for several days with what feels to her like another sinus infection.  She hurts behind her eyes.  She is not having much nasal discharge at this point she is not on any mucolytic agents.  We will call her in a Z-Pak and she should also start Mucinex 600 mg twice a day and drink plenty of water.

## 2011-06-25 NOTE — Progress Notes (Signed)
[name] Date of Birth:  1940/12/05 [facility] [address] [place], [place]  [address] [phone]         Fax   [phone]  History of Present Illness: This pleasant 71 year old woman is seen for a scheduled followup office visit.  She has a history of high blood pressure and a history of type 2 diabetes.  She also has hyperlipidemia and hypothyroidism.  She has known aortic valve sclerosis.  Recently she's had symptoms of acute sinusitis associated with headache behind her eyes dizziness but no fever.  Current Outpatient Prescriptions  Medication Sig Dispense Refill  . aspirin 81 MG tablet Take 81 mg by mouth daily.        Marland Kitchen Fexofenadine-Pseudoephedrine (ALLEGRA-D 24 HOUR PO) Take by mouth daily.      . fish oil-omega-3 fatty acids 1000 MG capsule Take 2 g by mouth daily. Taking occ.      Marland Kitchen glimepiride (AMARYL) 4 MG tablet Take 4 mg by mouth as needed.        Marland Kitchen losartan-hydrochlorothiazide (HYZAAR) 100-25 MG per tablet Take 1 tablet by mouth daily.  90 tablet  3  . multivitamin-iron-minerals-folic acid (CENTRUM) chewable tablet Chew 1 tablet by mouth daily.        Marland Kitchen POTASSIUM PO Take by mouth daily.        Marland Kitchen SYNTHROID 100 MCG tablet Take 1 tablet (100 mcg total) by mouth daily.  90 tablet  3  . azithromycin (ZITHROMAX) 250 MG tablet Take 2 tablets (500 mg) on  Day 1,  followed by 1 tablet (250 mg) once daily on Days 2 through 5.  6 each  0    Allergies  Allergen Reactions  . Claritin   . Lipitor (Atorvastatin Calcium)   . Metformin And Related   . Pravachol   . Zocor (Simvastatin)     Patient Active Problem List  Diagnoses  . Type II or unspecified type diabetes mellitus without mention of complication, not stated as uncontrolled  . HEMORRHOIDS-EXTERNAL  . Benign hypertensive heart disease without heart failure  . Hypothyroidism  . Aortic valve disease  . Sinusitis acute    History  Smoking status  . Never Smoker   Smokeless tobacco  .  Not on file    History  Alcohol Use No    No family history on file.  Review of Systems: Constitutional: no fever chills diaphoresis or fatigue or change in weight.  Head and neck: no hearing loss, no epistaxis, no photophobia or visual disturbance. Respiratory: No cough, shortness of breath or wheezing. Cardiovascular: No chest pain peripheral edema, palpitations. Gastrointestinal: No abdominal distention, no abdominal pain, no change in bowel habits hematochezia or melena. Genitourinary: No dysuria, no frequency, no urgency, no nocturia. Musculoskeletal:No arthralgias, no back pain, no gait disturbance or myalgias. Neurological: No dizziness, no headaches, no numbness, no seizures, no syncope, no weakness, no tremors. Hematologic: No lymphadenopathy, no easy bruising. Psychiatric: No confusion, no hallucinations, no sleep disturbance.    Physical Exam: Filed Vitals:   06/25/11 0856  BP: 126/82  Pulse: 60   the general appearance reveals a well-developed well-nourished woman in no distress.Pupils equal and reactive.   Extraocular Movements are full.  There is no scleral icterus.  There is maxillary sinus tenderness bilaterally.  The mouth and pharynx are normal.  The neck is supple.  The carotids reveal no bruits.  The jugular venous pressure is normal.  The thyroid is not enlarged.  There is  no lymphadenopathy.  The chest is clear to percussion and auscultation. There are no rales or rhonchi. Expansion of the chest is symmetrical.  The precordium is quiet.  The first heart sound is normal.  The second heart sound is physiologically split.  There is no  gallop rub or click.  There is a soft grade 2/6 systolic ejection murmur across the aortic valve.   There is no abnormal lift or heave. The abdomen is soft and nontender. Bowel sounds are normal. The liver and spleen are not enlarged. There Are no abdominal masses. There are no bruits.  The pedal pulses are good.  There is no  phlebitis or edema.  There is no cyanosis or clubbing. Strength is normal and symmetrical in all extremities.  There is no lateralizing weakness.  There are no sensory deficits.  The skin is warm and dry.  There is no rash. Strength is normal and symmetrical in all extremities.  There is no lateralizing weakness.  There are no sensory deficits.        Assessment / Plan: Continue same medication.  Use a Z-Pak and Mucinex for acute maxillary and frontal sinusitis. Recheck in 6 months for followup office visit EKG and fasting lab work including A1c

## 2011-06-25 NOTE — Assessment & Plan Note (Signed)
The patient is clinically euthyroid on current therapy 

## 2011-12-12 ENCOUNTER — Other Ambulatory Visit: Payer: Self-pay | Admitting: *Deleted

## 2011-12-12 ENCOUNTER — Other Ambulatory Visit: Payer: Self-pay | Admitting: Cardiology

## 2011-12-12 DIAGNOSIS — E039 Hypothyroidism, unspecified: Secondary | ICD-10-CM

## 2011-12-12 DIAGNOSIS — I10 Essential (primary) hypertension: Secondary | ICD-10-CM

## 2011-12-12 MED ORDER — SYNTHROID 100 MCG PO TABS: 100.0000 ug | ORAL_TABLET | Freq: Every day | ORAL | Status: DC

## 2011-12-12 MED ORDER — GLIMEPIRIDE 4 MG PO TABS: ORAL_TABLET | ORAL | Status: DC

## 2011-12-12 MED ORDER — LOSARTAN POTASSIUM-HCTZ 100-25 MG PO TABS: 1.0000 | ORAL_TABLET | Freq: Every day | ORAL | Status: DC

## 2011-12-12 NOTE — Telephone Encounter (Signed)
Per patient she only uses as needed

## 2012-01-09 ENCOUNTER — Ambulatory Visit (INDEPENDENT_AMBULATORY_CARE_PROVIDER_SITE_OTHER): Payer: Medicare Other | Admitting: *Deleted

## 2012-01-09 DIAGNOSIS — E7800 Pure hypercholesterolemia, unspecified: Secondary | ICD-10-CM

## 2012-01-09 DIAGNOSIS — E119 Type 2 diabetes mellitus without complications: Secondary | ICD-10-CM

## 2012-01-09 DIAGNOSIS — I119 Hypertensive heart disease without heart failure: Secondary | ICD-10-CM

## 2012-01-09 LAB — LIPID PANEL
Cholesterol: 200 mg/dL (ref 0–200)
HDL: 89.1 mg/dL (ref >39.00)
LDL Cholesterol: 102 mg/dL — ABNORMAL HIGH (ref 0–99)
Triglycerides: 45 mg/dL (ref 0.0–149.0)
VLDL: 9 mg/dL (ref 0.0–40.0)

## 2012-01-09 LAB — HEPATIC FUNCTION PANEL
ALT: 16 U/L (ref 0–35)
Albumin: 4 g/dL (ref 3.5–5.2)
Bilirubin, Direct: 0.1 mg/dL (ref 0.0–0.3)
Total Protein: 6.9 g/dL (ref 6.0–8.3)

## 2012-01-09 LAB — BASIC METABOLIC PANEL
BUN: 17 mg/dL (ref 6–23)
Chloride: 101 mEq/L (ref 96–112)
GFR: 87.11 mL/min (ref >60.00)
Potassium: 4.1 mEq/L (ref 3.5–5.1)
Sodium: 138 mEq/L (ref 135–145)

## 2012-01-09 LAB — HEMOGLOBIN A1C: Hgb A1c MFr Bld: 8 % — ABNORMAL HIGH (ref 4.6–6.5)

## 2012-01-09 LAB — TSH: TSH: 1.21 u[IU]/mL (ref 0.35–5.50)

## 2012-01-09 NOTE — Progress Notes (Signed)
Quick Note:  Please make copy of labs for patient visit. ______ 

## 2012-01-10 ENCOUNTER — Encounter: Payer: Self-pay | Admitting: Cardiology

## 2012-01-10 ENCOUNTER — Ambulatory Visit (INDEPENDENT_AMBULATORY_CARE_PROVIDER_SITE_OTHER): Payer: Medicare Other | Admitting: Cardiology

## 2012-01-10 VITALS — BP 128/80 | HR 80 | Ht 62.0 in | Wt 157.0 lb

## 2012-01-10 DIAGNOSIS — I119 Hypertensive heart disease without heart failure: Secondary | ICD-10-CM

## 2012-01-10 DIAGNOSIS — E119 Type 2 diabetes mellitus without complications: Secondary | ICD-10-CM

## 2012-01-10 DIAGNOSIS — E039 Hypothyroidism, unspecified: Secondary | ICD-10-CM

## 2012-01-10 DIAGNOSIS — E785 Hyperlipidemia, unspecified: Secondary | ICD-10-CM | POA: Insufficient documentation

## 2012-01-10 DIAGNOSIS — I359 Nonrheumatic aortic valve disorder, unspecified: Secondary | ICD-10-CM

## 2012-01-10 MED ORDER — GLIMEPIRIDE 1 MG PO TABS: 1.0000 mg | ORAL_TABLET | Freq: Every day | ORAL | Status: DC

## 2012-01-10 NOTE — Assessment & Plan Note (Signed)
Blood pressures are still not optimally controlled.  Her hemoglobin A1c has increased to 8.0.  She rarely takes anything for diabetes.  If her sugars get around 300 she will take 2 mg of Amaryl.  She is hesitant to take Amaryl because it often causes her to have symptomatic hypoglycemia.  We will decrease her Amaryl tablets to just 1 mg tablets and she may need to break those in half if necessary and I want her to take the Amaryl on a more regular basis try to get her hemoglobin A1c down.  She should take the Amaryl if her sugars are running more than 200 a instead of more than 300

## 2012-01-10 NOTE — Assessment & Plan Note (Signed)
Blood pressure has been stable.  Patient has been under a lot of stress regarding her husband who has severe arthritis.  Also her son has been having some marital problems which has stressed the patient significantly

## 2012-01-10 NOTE — Assessment & Plan Note (Signed)
The patient has a history of dyslipidemia.  She is intolerant of statins.  She is trying to watch her diet carefully.  Her recent lab work was satisfactory in terms of her lipid profile.

## 2012-01-10 NOTE — Progress Notes (Signed)
[name] Date of Birth:  1940/11/22 [facility] [address] [place], [place]  [address] [phone]         Fax   [phone]  History of Present Illness: This pleasant 71 year old woman is seen for a scheduled followup office visit.  She has a past history of hypertension, diabetes, hypercholesterolemia, and hypothyroidism.  She also has a known murmur of aortic stenosis.  Since last visit she has generally been doing well.  She has not been experiencing any chest pain with exertion.  She does not have any history of ischemic heart disease.  She does have occasional palpitations.  She has a history of restless leg syndrome which has improved with an oral over-the-counter potassium pill daily she does not have any history of CHF  Current Outpatient Prescriptions  Medication Sig Dispense Refill  . aspirin 81 MG tablet Take 81 mg by mouth daily.        Marland Kitchen Fexofenadine-Pseudoephedrine (ALLEGRA-D 24 HOUR PO) Take by mouth daily.      . fish oil-omega-3 fatty acids 1000 MG capsule Take 2 g by mouth daily. Taking occ.      Marland Kitchen losartan-hydrochlorothiazide (HYZAAR) 100-25 MG per tablet Take 1 tablet by mouth daily.  90 tablet  0  . multivitamin-iron-minerals-folic acid (CENTRUM) chewable tablet Chew 1 tablet by mouth daily.        Marland Kitchen POTASSIUM PO Take by mouth daily. otc potassium      . SYNTHROID 100 MCG tablet Take 1 tablet (100 mcg total) by mouth daily.  90 tablet  0  . DISCONTD: glimepiride (AMARYL) 4 MG tablet 1/2 tablet daily or as directed  90 tablet  3  . glimepiride (AMARYL) 1 MG tablet Take 1 tablet (1 mg total) by mouth daily before breakfast.  30 tablet  6    Allergies  Allergen Reactions  . Lipitor (Atorvastatin Calcium)   . Loratadine   . Metformin And Related   . Pravachol   . Zocor (Simvastatin)     Patient Active Problem List  Diagnosis  . Type II or unspecified type diabetes mellitus without mention of complication, not stated as uncontrolled    . HEMORRHOIDS-EXTERNAL  . Benign hypertensive heart disease without heart failure  . Hypothyroidism  . Aortic valve disease  . Sinusitis acute  . Dyslipidemia    History  Smoking status  . Never Smoker   Smokeless tobacco  . Not on file    History  Alcohol Use No    No family history on file.  Review of Systems: Constitutional: no fever chills diaphoresis or fatigue or change in weight.  Head and neck: no hearing loss, no epistaxis, no photophobia or visual disturbance. Respiratory: No cough, shortness of breath or wheezing. Cardiovascular: No chest pain peripheral edema, palpitations. Gastrointestinal: No abdominal distention, no abdominal pain, no change in bowel habits hematochezia or melena. Genitourinary: No dysuria, no frequency, no urgency, no nocturia. Musculoskeletal:No arthralgias, no back pain, no gait disturbance or myalgias. Neurological: No dizziness, no headaches, no numbness, no seizures, no syncope, no weakness, no tremors. Hematologic: No lymphadenopathy, no easy bruising. Psychiatric: No confusion, no hallucinations, no sleep disturbance.    Physical Exam: Filed Vitals:   01/10/12 1128  BP: 128/80  Pulse: 80   the general appearance reveals a well-developed well-nourished woman in no distress.The head and neck exam reveals pupils equal and reactive.  Extraocular movements are full.  There is no scleral icterus.  The mouth and pharynx are  normal.  The neck is supple.  The carotids reveal no bruits.  The jugular venous pressure is normal.  The  thyroid is not enlarged.  There is no lymphadenopathy.  The chest is clear to percussion and auscultation.  There are no rales or rhonchi.  Expansion of the chest is symmetrical.  The precordium is quiet.  The first heart sound is normal.  The second heart sound is physiologically split.  There is no  gallop rub or click.  There is a grade 2/6 systolic ejection murmur at the base consistent with aortic stenosis which  radiates to the carotids particularly on the right  There is no abnormal lift or heave.  The abdomen is soft and nontender.  The bowel sounds are normal.  The liver and spleen are not enlarged.  There are no abdominal masses.  There are no abdominal bruits.  Extremities reveal good pedal pulses.  There is no phlebitis or edema.  There is no cyanosis or clubbing.  Strength is normal and symmetrical in all extremities.  There is no lateralizing weakness.  There are no sensory deficits.  The skin is warm and dry.  There is no rash.     Assessment / Plan: Continue same medication except reduce strength of generic Amaryl to 1 mg tablets.  Recheck in 6 months for followup office visit lipid panel hepatic function panel and basal metabolic panel TSH and A1c

## 2012-01-10 NOTE — Patient Instructions (Addendum)
Your physician has recommended you make the following change in your medication: START Amaryl 1mg  once daily  Your physician wants you to follow-up in: 6 months.   You will receive a reminder letter in the mail two months in advance. If you don't receive a letter, please call our office to schedule the follow-up appointment  Your physician recommends that you return for lab work in: 6 months at or before your next office visit (lipid, liver, tsh, bmet, a1c)

## 2012-01-10 NOTE — Assessment & Plan Note (Signed)
The patient has a history of mild aortic valve stenosis.  She has not had a recent echocardiogram.  She has not been experiencing any exertional dizziness or syncope and she has not been expressing any angina or symptoms of CHF.

## 2012-06-02 ENCOUNTER — Other Ambulatory Visit: Payer: Self-pay

## 2012-06-02 DIAGNOSIS — I10 Essential (primary) hypertension: Secondary | ICD-10-CM

## 2012-06-02 MED ORDER — LOSARTAN POTASSIUM-HCTZ 100-25 MG PO TABS: 1.0000 | ORAL_TABLET | Freq: Every day | ORAL | Status: DC

## 2012-06-04 ENCOUNTER — Other Ambulatory Visit: Payer: Self-pay

## 2012-06-04 DIAGNOSIS — E039 Hypothyroidism, unspecified: Secondary | ICD-10-CM

## 2012-06-04 MED ORDER — SYNTHROID 100 MCG PO TABS: 100.0000 ug | ORAL_TABLET | Freq: Every day | ORAL | Status: DC

## 2012-06-30 ENCOUNTER — Emergency Department (HOSPITAL_COMMUNITY): Payer: Medicare Other

## 2012-06-30 ENCOUNTER — Encounter (HOSPITAL_COMMUNITY): Payer: Self-pay | Admitting: Emergency Medicine

## 2012-06-30 DIAGNOSIS — M25519 Pain in unspecified shoulder: Secondary | ICD-10-CM | POA: Insufficient documentation

## 2012-06-30 DIAGNOSIS — I119 Hypertensive heart disease without heart failure: Principal | ICD-10-CM | POA: Insufficient documentation

## 2012-06-30 DIAGNOSIS — E890 Postprocedural hypothyroidism: Secondary | ICD-10-CM | POA: Insufficient documentation

## 2012-06-30 DIAGNOSIS — E119 Type 2 diabetes mellitus without complications: Secondary | ICD-10-CM | POA: Insufficient documentation

## 2012-06-30 DIAGNOSIS — F411 Generalized anxiety disorder: Secondary | ICD-10-CM | POA: Insufficient documentation

## 2012-06-30 DIAGNOSIS — R079 Chest pain, unspecified: Secondary | ICD-10-CM | POA: Insufficient documentation

## 2012-06-30 LAB — CBC WITH DIFFERENTIAL/PLATELET
Basophils Absolute: 0 10*3/uL (ref 0.0–0.1)
Eosinophils Absolute: 0.5 10*3/uL (ref 0.0–0.7)
Eosinophils Relative: 6 % — ABNORMAL HIGH (ref 0–5)
HCT: 42.1 % (ref 36.0–46.0)
Lymphocytes Relative: 31 % (ref 12–46)
MCH: 26.2 pg (ref 26.0–34.0)
MCV: 79.9 fL (ref 78.0–100.0)
Monocytes Absolute: 0.7 10*3/uL (ref 0.1–1.0)
Platelets: 295 10*3/uL (ref 150–400)
RDW: 15.8 % — ABNORMAL HIGH (ref 11.5–15.5)
WBC: 8.4 10*3/uL (ref 4.0–10.5)

## 2012-06-30 NOTE — ED Notes (Signed)
Patient complaining of chest pain and pressure that started this morning.  Patient denies shortness of breath, nausea, vomiting, and diaphoresis.  Past medical history includes diabetes, hypertension, and heart murmur.

## 2012-07-01 ENCOUNTER — Emergency Department (HOSPITAL_COMMUNITY): Payer: Medicare Other

## 2012-07-01 ENCOUNTER — Observation Stay (HOSPITAL_COMMUNITY)
Admission: EM | Admit: 2012-07-01 | Discharge: 2012-07-01 | Disposition: A | Discharge status: Home / Self Care | Payer: Medicare Other | Attending: Internal Medicine | Admitting: Internal Medicine

## 2012-07-01 ENCOUNTER — Encounter (HOSPITAL_COMMUNITY): Payer: Self-pay | Admitting: Radiology

## 2012-07-01 DIAGNOSIS — E119 Type 2 diabetes mellitus without complications: Secondary | ICD-10-CM

## 2012-07-01 DIAGNOSIS — I160 Hypertensive urgency: Secondary | ICD-10-CM | POA: Diagnosis present

## 2012-07-01 DIAGNOSIS — E039 Hypothyroidism, unspecified: Secondary | ICD-10-CM

## 2012-07-01 DIAGNOSIS — K644 Residual hemorrhoidal skin tags: Secondary | ICD-10-CM

## 2012-07-01 DIAGNOSIS — I119 Hypertensive heart disease without heart failure: Secondary | ICD-10-CM | POA: Diagnosis present

## 2012-07-01 DIAGNOSIS — E785 Hyperlipidemia, unspecified: Secondary | ICD-10-CM

## 2012-07-01 DIAGNOSIS — I10 Essential (primary) hypertension: Secondary | ICD-10-CM

## 2012-07-01 DIAGNOSIS — R072 Precordial pain: Secondary | ICD-10-CM

## 2012-07-01 DIAGNOSIS — R079 Chest pain, unspecified: Secondary | ICD-10-CM

## 2012-07-01 DIAGNOSIS — I359 Nonrheumatic aortic valve disorder, unspecified: Secondary | ICD-10-CM

## 2012-07-01 HISTORY — DX: Cardiac murmur, unspecified: R01.1

## 2012-07-01 LAB — CREATININE, SERUM
Creatinine, Ser: 0.66 mg/dL (ref 0.50–1.10)
GFR calc Af Amer: >90 mL/min (ref >90)

## 2012-07-01 LAB — COMPREHENSIVE METABOLIC PANEL
ALT: 20 U/L (ref 0–35)
AST: 21 U/L (ref 0–37)
Albumin: 4.1 g/dL (ref 3.5–5.2)
CO2: 29 mEq/L (ref 19–32)
Chloride: 98 mEq/L (ref 96–112)
GFR calc non Af Amer: 70 mL/min — ABNORMAL LOW (ref >90)
Sodium: 138 mEq/L (ref 135–145)
Total Bilirubin: 0.2 mg/dL — ABNORMAL LOW (ref 0.3–1.2)

## 2012-07-01 LAB — CBC
Hemoglobin: 14.2 g/dL (ref 12.0–15.0)
MCH: 26.9 pg (ref 26.0–34.0)
MCV: 78.8 fL (ref 78.0–100.0)
RBC: 5.28 MIL/uL — ABNORMAL HIGH (ref 3.87–5.11)
WBC: 6.3 10*3/uL (ref 4.0–10.5)

## 2012-07-01 LAB — TROPONIN I
Troponin I: <0.3 ng/mL (ref <0.30)
Troponin I: <0.3 ng/mL (ref <0.30)

## 2012-07-01 LAB — POCT I-STAT TROPONIN I: Troponin i, poc: 0.01 ng/mL (ref 0.00–0.08)

## 2012-07-01 MED ORDER — IOHEXOL 350 MG/ML SOLN
100.0000 mL | Freq: Once | INTRAVENOUS | Status: AC | PRN
  Administered 2012-07-01: 100 mL via INTRAVENOUS

## 2012-07-01 MED ORDER — LOSARTAN POTASSIUM 50 MG PO TABS
100.0000 mg | ORAL_TABLET | Freq: Every day | ORAL | Status: DC
  Administered 2012-07-01: 100 mg via ORAL
  Filled 2012-07-01 (×2): qty 2

## 2012-07-01 MED ORDER — HEPARIN SODIUM (PORCINE) 5000 UNIT/ML IJ SOLN
5000.0000 [IU] | Freq: Three times a day (TID) | INTRAMUSCULAR | Status: DC
  Filled 2012-07-01 (×3): qty 1

## 2012-07-01 MED ORDER — GI COCKTAIL ~~LOC~~
30.0000 mL | Freq: Once | ORAL | Status: AC
  Administered 2012-07-01: 30 mL via ORAL
  Filled 2012-07-01: qty 30

## 2012-07-01 MED ORDER — NITROGLYCERIN 0.4 MG SL SUBL: 0.4000 mg | SUBLINGUAL_TABLET | SUBLINGUAL | Status: DC | PRN

## 2012-07-01 MED ORDER — LEVOTHYROXINE SODIUM 100 MCG PO TABS
100.0000 ug | ORAL_TABLET | Freq: Every day | ORAL | Status: DC
  Administered 2012-07-01: 100 ug via ORAL
  Filled 2012-07-01 (×3): qty 1

## 2012-07-01 MED ORDER — ADULT MULTIVITAMIN W/MINERALS CH
1.0000 | ORAL_TABLET | Freq: Every day | ORAL | Status: DC
  Administered 2012-07-01: 1 via ORAL
  Filled 2012-07-01: qty 1

## 2012-07-01 MED ORDER — GLIMEPIRIDE 1 MG PO TABS: 1.0000 mg | ORAL_TABLET | Freq: Every day | ORAL | Status: DC

## 2012-07-01 MED ORDER — OMEGA-3-ACID ETHYL ESTERS 1 G PO CAPS
2.0000 g | ORAL_CAPSULE | Freq: Every day | ORAL | Status: DC
  Administered 2012-07-01: 2 g via ORAL
  Filled 2012-07-01 (×2): qty 2

## 2012-07-01 MED ORDER — OMEGA-3 FATTY ACIDS 1000 MG PO CAPS: 2.0000 g | ORAL_CAPSULE | Freq: Every day | ORAL | Status: DC

## 2012-07-01 MED ORDER — ASPIRIN 81 MG PO CHEW
81.0000 mg | CHEWABLE_TABLET | Freq: Every day | ORAL | Status: DC
  Administered 2012-07-01: 81 mg via ORAL
  Filled 2012-07-01: qty 1

## 2012-07-01 MED ORDER — SODIUM CHLORIDE 0.9 % IJ SOLN
3.0000 mL | Freq: Two times a day (BID) | INTRAMUSCULAR | Status: DC
  Administered 2012-07-01: 3 mL via INTRAVENOUS

## 2012-07-01 MED ORDER — LOSARTAN POTASSIUM-HCTZ 100-25 MG PO TABS: 1.0000 | ORAL_TABLET | Freq: Every day | ORAL | Status: DC

## 2012-07-01 MED ORDER — POTASSIUM GLUCONATE 595 (99 K) MG PO TABS: 595.0000 mg | ORAL_TABLET | Freq: Every day | ORAL | Status: DC

## 2012-07-01 MED ORDER — INFLUENZA VIRUS VACC SPLIT PF IM SUSP: 0.5000 mL | INTRAMUSCULAR | Status: DC

## 2012-07-01 MED ORDER — CINNAMON 500 MG PO CAPS: 1000.0000 mg | ORAL_CAPSULE | Freq: Two times a day (BID) | ORAL | Status: DC

## 2012-07-01 MED ORDER — HYDROCHLOROTHIAZIDE 25 MG PO TABS
25.0000 mg | ORAL_TABLET | Freq: Every day | ORAL | Status: DC
  Administered 2012-07-01: 25 mg via ORAL
  Filled 2012-07-01: qty 1

## 2012-07-01 MED ORDER — ASPIRIN 81 MG PO TABS: 81.0000 mg | ORAL_TABLET | Freq: Every day | ORAL | Status: DC

## 2012-07-01 NOTE — Progress Notes (Signed)
*  PRELIMINARY RESULTS* Echocardiogram 2D Echocardiogram has been performed.  Tina [name] 07/01/2012, 10:15 AM

## 2012-07-01 NOTE — ED Notes (Signed)
MD at bedside. 

## 2012-07-01 NOTE — ED Notes (Signed)
Pt NPO until echo is complete per protocol and MD

## 2012-07-01 NOTE — Discharge Summary (Signed)
Physician Discharge Summary  Tina [name] ZOX:[id number] DOB: [birth year] DOA: 07/01/2012  PCP: Kaleen Mask, MD  Admit date: 07/01/2012 Discharge date: 07/01/2012  Time spent: 55 minutes  Recommendations for Outpatient Follow-up:  1. Patient to follow up with cardiology for possible stress test   Discharge Diagnoses:  Hypertensive urgency  Benign hypertensive heart disease without heart failure  Chest pain Anxiety disorder   Discharge Condition: good  Diet recommendation: heart healthy   There were no vitals filed for this visit.  History of present illness:  Tina [name] is a 72 y.o. female who presents with chest pressure sensation that started around 3:30 pm this afternoon. She does not have SOB with her chest pressure, and had no symptoms earlier in the morning during her daily 3 mile walk nor while gardening after that. She took her BP at home and found that her SBP was over 200, she took her losartan and came to the ED.  In the ED her symptoms resolved and her SBP was noted to be in the 150s. Cardiology was consulted but asked medicine to admit the patient. Her risk factors include DM2, and HTN, but she has a negative risk factor in that her cholesterol is excellent (LDL 102 and HDL 89 on last evaluation).      Hospital Course:  1. HTN urgency - unclear etiology. Resolved in the ED after patient relaxed. She admitted to being very stressed when she came to the ED. She had chest pain during the episode but without EKG changes and normal cardiac enzymes. We recommended to continue current medications and to follow up with Dr. Patty Sermons 2. DM type 2 - controlled 3. Anxiety - related to son's divorce - advised to seek counseling   Procedures: Echocardiogram  Consultations:  none  Discharge Exam: Filed Vitals:   07/01/12 0645 07/01/12 0737 07/01/12 0815 07/01/12 1028  BP: 155/80 169/81 133/75 156/80  Pulse: 65 80 76 77  Temp:    98 F (36.7 C)  TempSrc:       Resp: 14 16 24 20   SpO2: 100% 100% 98% 100%    General: axox3 Cardiovascular: rrr Respiratory: ctab  Discharge Instructions  Discharge Orders    Future Orders Please Complete By Expires   Diet - low sodium heart healthy      Increase activity slowly          Medication List     As of 07/01/2012  2:13 PM    TAKE these medications         aspirin 81 MG tablet   Take 81 mg by mouth daily.      Cinnamon 500 MG capsule   Take 1,000 mg by mouth 2 (two) times daily.      fish oil-omega-3 fatty acids 1000 MG capsule   Take 2 g by mouth daily. Taking occ.      glimepiride 1 MG tablet   Commonly known as: AMARYL   Take 1 tablet (1 mg total) by mouth daily before breakfast.      losartan-hydrochlorothiazide 100-25 MG per tablet   Commonly known as: HYZAAR   Take 1 tablet by mouth daily.      multivitamin with minerals Tabs   Take 1 tablet by mouth daily.      nitroGLYCERIN 0.4 MG SL tablet   Commonly known as: NITROSTAT   Place 1 tablet (0.4 mg total) under the tongue every 5 (five) minutes as needed for chest pain.  potassium gluconate 595 MG Tabs   Take 595 mg by mouth daily.      SYNTHROID 100 MCG tablet   Generic drug: levothyroxine   Take 1 tablet (100 mcg total) by mouth daily.           Follow-up Information    Follow up with Kaleen Mask, MD.   Contact information:   [address] [place] [address] [phone]       Schedule an appointment as soon as possible for a visit with Cassell Clement, MD.   Contact information:   [address] [place] [address] [phone]           The results of significant diagnostics from this hospitalization (including imaging, microbiology, ancillary and laboratory) are listed below for reference.    Significant Diagnostic Studies: Dg Chest 2 View  06/30/2012  *RADIOLOGY REPORT*  Clinical Data: High blood pressure.  Chest pressure.  Headache.  CHEST - 2 VIEW   Comparison: None.  Findings: The heart size and pulmonary vascularity are normal. The lungs appear clear and expanded without focal air space disease or consolidation. No blunting of the costophrenic angles.  No pneumothorax.  Mediastinal contours appear intact.  Tortuous and calcified aorta.  Mild degenerative changes in the spine.  IMPRESSION: No evidence of active pulmonary disease.   Original Report Authenticated By: Burman Nieves, M.D.    Ct Angio Abdomen W/cm &/or Wo Contrast  07/01/2012  *RADIOLOGY REPORT*  Clinical Data:  Chest pain and pressure.  History of diabetes and hypertension.  CT ANGIOGRAPHY CHEST AND ABDOMEN  Technique:  Multidetector CT imaging through the chest and abdomen was performed using the standard protocol during bolus administration of intravenous contrast.  Multiplanar reconstructed images including MIPs were obtained and reviewed to evaluate the vascular anatomy.  Contrast: OMNIPAQUE IOHEXOL 350 MG/ML SOLN  Comparison:   None.  CTA CHEST  Findings:  Unenhanced images demonstrate normal caliber of the thoracic aorta with scattered calcifications.  No significant coronary artery calcification.  No evidence of intramural thrombus. Incidental note of a peripherally calcified nodules in the thyroid gland bilaterally.  Consider elective ultrasound followup for further evaluation.  Arterial phase contrast enhanced images obtained demonstrate normal caliber thoracic aorta with flow throughout.  No evidence of aortic dissection.  Motion artifact in the aortic root.  Typical branching pattern of the great vessels.  Normal heart size.  Pulmonary arteries are patent without evidence of pulmonary embolus.  The esophagus is mostly decompressed.  Small esophageal hiatal hernia is present.  No abnormal mediastinal fluid collections.  No significant lymphadenopathy in the chest.  No pleural effusions.  Mild atelectasis in the lung bases.  No evidence of significant airspace or interstitial  disease.  No focal consolidation.  No pneumothorax.  Airways appear patent. Degenerative changes in the thoracic spine with normal alignment. No thoracic compression fractures.   Review of the MIP images confirms the above findings.  IMPRESSION: No evidence of aortic aneurysm or dissection.  No evidence of active pulmonary disease.  CTA ABDOMEN  Findings:  Normal caliber of the abdominal aorta with mild calcification.  No evidence of aortic dissection.  No abnormal retroperitoneal fluid collections.  The celiac axis, superior mesenteric artery, inferior mesenteric artery, and bilateral common iliac arteries are patent.  The renal nephrograms are symmetrical. There is a single right renal artery which is patent.  There are three left renal arteries, including a main renal artery and smaller arteries to the  upper and lower pole, which are patent.  The liver, spleen, gallbladder, pancreas, adrenal glands, kidneys, and retroperitoneal lymph nodes are unremarkable.  The visualized portions of the stomach, small bowel, and colon are not abnormally distended.  No free air or free fluid in the abdomen.  The pelvis is not included in the study.   Review of the MIP images confirms the above findings.  IMPRESSION: No abdominal aortic aneurysm or dissection.   Original Report Authenticated By: Burman Nieves, M.D.    Ct Angio Chest Aorta W/cm &/or Wo/cm  07/01/2012  *RADIOLOGY REPORT*  Clinical Data:  Chest pain and pressure.  History of diabetes and hypertension.  CT ANGIOGRAPHY CHEST AND ABDOMEN  Technique:  Multidetector CT imaging through the chest and abdomen was performed using the standard protocol during bolus administration of intravenous contrast.  Multiplanar reconstructed images including MIPs were obtained and reviewed to evaluate the vascular anatomy.  Contrast: OMNIPAQUE IOHEXOL 350 MG/ML SOLN  Comparison:   None.  CTA CHEST  Findings:  Unenhanced images demonstrate normal caliber of the thoracic aorta  with scattered calcifications.  No significant coronary artery calcification.  No evidence of intramural thrombus. Incidental note of a peripherally calcified nodules in the thyroid gland bilaterally.  Consider elective ultrasound followup for further evaluation.  Arterial phase contrast enhanced images obtained demonstrate normal caliber thoracic aorta with flow throughout.  No evidence of aortic dissection.  Motion artifact in the aortic root.  Typical branching pattern of the great vessels.  Normal heart size.  Pulmonary arteries are patent without evidence of pulmonary embolus.  The esophagus is mostly decompressed.  Small esophageal hiatal hernia is present.  No abnormal mediastinal fluid collections.  No significant lymphadenopathy in the chest.  No pleural effusions.  Mild atelectasis in the lung bases.  No evidence of significant airspace or interstitial disease.  No focal consolidation.  No pneumothorax.  Airways appear patent. Degenerative changes in the thoracic spine with normal alignment. No thoracic compression fractures.   Review of the MIP images confirms the above findings.  IMPRESSION: No evidence of aortic aneurysm or dissection.  No evidence of active pulmonary disease.  CTA ABDOMEN  Findings:  Normal caliber of the abdominal aorta with mild calcification.  No evidence of aortic dissection.  No abnormal retroperitoneal fluid collections.  The celiac axis, superior mesenteric artery, inferior mesenteric artery, and bilateral common iliac arteries are patent.  The renal nephrograms are symmetrical. There is a single right renal artery which is patent.  There are three left renal arteries, including a main renal artery and smaller arteries to the upper and lower pole, which are patent.  The liver, spleen, gallbladder, pancreas, adrenal glands, kidneys, and retroperitoneal lymph nodes are unremarkable.  The visualized portions of the stomach, small bowel, and colon are not abnormally distended.  No  free air or free fluid in the abdomen.  The pelvis is not included in the study.   Review of the MIP images confirms the above findings.  IMPRESSION: No abdominal aortic aneurysm or dissection.   Original Report Authenticated By: Burman Nieves, M.D.     Microbiology: No results found for this or any previous visit (from the past 240 hour(s)).   Labs: Basic Metabolic Panel:  Lab 07/01/12 1610 06/30/12 2330  NA -- 138  K -- 3.7  CL -- 98  CO2 -- 29  GLUCOSE -- 126*  BUN -- 20  CREATININE 0.66 0.81  CALCIUM -- 9.9  MG -- --  PHOS -- --  Liver Function Tests:  Lab 06/30/12 2330  AST 21  ALT 20  ALKPHOS 85  BILITOT 0.2*  PROT 7.8  ALBUMIN 4.1   No results found for this basename: LIPASE:5,AMYLASE:5 in the last 168 hours No results found for this basename: AMMONIA:5 in the last 168 hours CBC:  Lab 07/01/12 0620 06/30/12 2330  WBC 6.3 8.4  NEUTROABS -- 4.5  HGB 14.2 13.8  HCT 41.6 42.1  MCV 78.8 79.9  PLT 264 295   Cardiac Enzymes:  Lab 07/01/12 1210 07/01/12 0620  CKTOTAL -- --  CKMB -- --  CKMBINDEX -- --  TROPONINI <0.30 <0.30   BNP: BNP (last 3 results) No results found for this basename: PROBNP:3 in the last 8760 hours CBG:  Lab 07/01/12 1110 07/01/12 0651  GLUCAP 107* 139*       Signed:  Lorrin Nawrot  [facility] 07/01/2012, 2:13 PM

## 2012-07-01 NOTE — ED Notes (Signed)
Called [facility] to inquire about when transporting for [facility]. No one in [facility] office someone to call back and provide update. Patient waiting for bed placement.

## 2012-07-01 NOTE — Progress Notes (Signed)
Utilization review completed.  

## 2012-07-01 NOTE — ED Notes (Signed)
Patient transported to CT 

## 2012-07-01 NOTE — ED Provider Notes (Signed)
History     CSN: [id number]  Arrival date & time 06/30/12  2311   First MD Initiated Contact with Patient 07/01/12 0005      Chief Complaint  Patient presents with  . Chest Pain    (Consider location/radiation/quality/duration/timing/severity/associated sxs/prior treatment) Patient is a 72 y.o. female presenting with chest pain. The history is provided by the patient and the spouse. No language interpreter was used.  Chest Pain The chest pain began 12 - 24 hours ago. Chest pain occurs constantly. The chest pain is unchanged. The severity of the pain is mild. The quality of the pain is described as pressure-like. The pain radiates to the right shoulder and left shoulder. Pertinent negatives for primary symptoms include no nausea and no vomiting.  Pertinent negatives for associated symptoms include no lower extremity edema. She tried nothing for the symptoms. Risk factors include being elderly.  Pertinent negatives for past medical history include no MI.  Procedure history is negative for cardiac catheterization.     Past Medical History  Diagnosis Date  . Diabetes mellitus   . Hypertension   . Hypothyroidism   . Hyperlipidemia   . Weakness   . Dizziness     Past Surgical History  Procedure Date  . Colonoscopy   . Goiter removal 1972  . Cardiovascular stress test 03/06/2007    EF 86%  . US echocardiography 10/10/2006    EF 55-60%  . Brain surgery   . Abdominal hysterectomy     History reviewed. No pertinent family history.  History  Substance Use Topics  . Smoking status: Never Smoker   . Smokeless tobacco: Not on file  . Alcohol Use: No    OB History    Grav Para Term Preterm Abortions TAB SAB Ect Mult Living                  Review of Systems  Cardiovascular: Positive for chest pain.  Gastrointestinal: Negative for nausea and vomiting.  All other systems reviewed and are negative.    Allergies  Lipitor; Loratadine; Metformin and related; Pravachol;  and Zocor  Home Medications   Current Outpatient Rx  Name  Route  Sig  Dispense  Refill  . ASPIRIN 81 MG PO TABS   Oral   Take 81 mg by mouth daily.           . ALLEGRA-D 24 HOUR PO   Oral   Take by mouth daily.         . OMEGA-3 FATTY ACIDS 1000 MG PO CAPS   Oral   Take 2 g by mouth daily. Taking occ.         Marland Kitchen GLIMEPIRIDE 1 MG PO TABS   Oral   Take 1 tablet (1 mg total) by mouth daily before breakfast.   30 tablet   6   . LOSARTAN POTASSIUM-HCTZ 100-25 MG PO TABS   Oral   Take 1 tablet by mouth daily.   90 tablet   1   . CENTRUM PO CHEW   Oral   Chew 1 tablet by mouth daily.           Marland Kitchen POTASSIUM PO   Oral   Take by mouth daily. otc potassium         . SYNTHROID 100 MCG PO TABS   Oral   Take 1 tablet (100 mcg total) by mouth daily.   90 tablet   2     Dispense as written.  Pt need to call office to schedule for a appointme ...     BP 216/97  Pulse 73  Temp 98.2 F (36.8 C) (Oral)  Resp 16  SpO2 99%  Physical Exam  Constitutional: She is oriented to person, place, and time. She appears well-developed and well-nourished. No distress.  HENT:  Head: Normocephalic and atraumatic.  Mouth/Throat: Oropharynx is clear and moist.  Eyes: Conjunctivae normal are normal. Pupils are equal, round, and reactive to light.  Neck: Normal range of motion. Neck supple.  Cardiovascular: Normal rate, regular rhythm and intact distal pulses.   Pulmonary/Chest: Effort normal and breath sounds normal. She has no wheezes. She has no rales.  Abdominal: Soft. Bowel sounds are normal. There is no tenderness. There is no rebound and no guarding.  Musculoskeletal: Normal range of motion.  Neurological: She is alert and oriented to person, place, and time. She has normal reflexes.  Skin: Skin is warm and dry.  Psychiatric: She has a normal mood and affect.    ED Course  Procedures (including critical care time)  Labs Reviewed  CBC WITH DIFFERENTIAL - Abnormal;  Notable for the following:    RBC 5.27 (*)     RDW 15.8 (*)     Eosinophils Relative 6 (*)     All other components within normal limits  COMPREHENSIVE METABOLIC PANEL - Abnormal; Notable for the following:    Glucose, Bld 126 (*)     Total Bilirubin 0.2 (*)     GFR calc non Af Amer 70 (*)     GFR calc Af Amer 82 (*)     All other components within normal limits  POCT I-STAT TROPONIN I   Dg Chest 2 View  06/30/2012  *RADIOLOGY REPORT*  Clinical Data: High blood pressure.  Chest pressure.  Headache.  CHEST - 2 VIEW  Comparison: None.  Findings: The heart size and pulmonary vascularity are normal. The lungs appear clear and expanded without focal air space disease or consolidation. No blunting of the costophrenic angles.  No pneumothorax.  Mediastinal contours appear intact.  Tortuous and calcified aorta.  Mild degenerative changes in the spine.  IMPRESSION: No evidence of active pulmonary disease.   Original Report Authenticated By: Burman Nieves, M.D.      No diagnosis found.    MDM   Date: 07/01/2012  Rate: 66  Rhythm: normal sinus rhythm  QRS Axis: normal  Intervals: normal  ST/T Wave abnormalities: normal  Conduction Disutrbances:none  Narrative Interpretation: poor R wave progression  Old EKG Reviewed: none available          Buford Bremer K Ahnyla Mendel-Rasch, MD 07/01/12 0530

## 2012-07-01 NOTE — ED Notes (Signed)
Patient transported to [facility].

## 2012-07-01 NOTE — ED Notes (Signed)
Called [facility], they will be transporting patient up to room. 3 W made aware. No belongings left in ED room.

## 2012-07-01 NOTE — H&P (Signed)
[facility] History and Physical  Tina [name] QIH:[id number] DOB: 1940-10-29 DOA: 07/01/2012  Referring physician: ED PCP: Cassell Clement, MD  Specialists: [facility]  Chief Complaint: Chest pain  HPI: Tina [name] is a 72 y.o. female who presents with chest pressure sensation that started around 3:30 pm this afternoon.  She does not have SOB with her chest pressure, and had no symptoms earlier in the morning during her daily 3 mile walk nor while gardening after that.  She took her BP at home and found that her SBP was over 200, she took her losartan and came to the ED.  In the ED her symptoms resolved and her SBP was noted to be in the 150s.  Cardiology was consulted but asked medicine to admit the patient.  Her risk factors include DM2, and HTN, but she has a negative risk factor in that her cholesterol is excellent (LDL 102 and HDL 89 on last evaluation).  Review of Systems: Patient reports walking 2-3 miles a day 5 days a week as part of her exercise routine, she has never had an MI previously, 12 systems reviewed and otherwise negative.  Past Medical History  Diagnosis Date  . Diabetes mellitus   . Hypertension   . Hypothyroidism   . Hyperlipidemia   . Weakness   . Dizziness    Past Surgical History  Procedure Date  . Colonoscopy   . Goiter removal 1972  . Cardiovascular stress test 03/06/2007    EF 86%  . US echocardiography 10/10/2006    EF 55-60%  . Brain surgery   . Abdominal hysterectomy    Social History:  reports that she has never smoked. She does not have any smokeless tobacco history on file. She reports that she does not drink alcohol or use illicit drugs.   Allergies  Allergen Reactions  . Lipitor (Atorvastatin Calcium)   . Loratadine   . Metformin And Related   . Pravachol   . Zocor (Simvastatin)     History reviewed. No pertinent family history.  Prior to Admission medications   Medication Sig Start Date End Date Taking? Authorizing  Provider  aspirin 81 MG tablet Take 81 mg by mouth daily.     Yes Historical Provider, MD  Cinnamon 500 MG capsule Take 1,000 mg by mouth 2 (two) times daily.   Yes Historical Provider, MD  fish oil-omega-3 fatty acids 1000 MG capsule Take 2 g by mouth daily. Taking occ.   Yes Historical Provider, MD  glimepiride (AMARYL) 1 MG tablet Take 1 tablet (1 mg total) by mouth daily before breakfast. 01/10/12 01/09/13 Yes Cassell Clement, MD  losartan-hydrochlorothiazide (HYZAAR) 100-25 MG per tablet Take 1 tablet by mouth daily. 06/02/12 06/02/13 Yes Cassell Clement, MD  Multiple Vitamin (MULTIVITAMIN WITH MINERALS) TABS Take 1 tablet by mouth daily.   Yes Historical Provider, MD  potassium gluconate 595 MG TABS Take 595 mg by mouth daily.   Yes Historical Provider, MD  SYNTHROID 100 MCG tablet Take 1 tablet (100 mcg total) by mouth daily. 06/04/12 06/04/13 Yes Cassell Clement, MD   Physical Exam: Filed Vitals:   06/30/12 2315 07/01/12 0100 07/01/12 0301  BP: 216/97 151/64 166/75  Pulse: 73 72 69  Temp: 98.2 F (36.8 C)  97.7 F (36.5 C)  TempSrc: Oral  Oral  Resp: 16 17 10   SpO2: 99% 100% 99%    General:  NAD, resting comfortably in bed Eyes: PEERLA EOMI ENT: mucous membranes moist Neck: supple w/o JVD Cardiovascular:  RRR w/o MRG, despite history of AS no murmur on my exam. Respiratory: CTA B Abdomen: soft, nt, nd, bs+ Skin: no rash nor lesion Musculoskeletal: MAE, full ROM all 4 extremities Psychiatric: normal tone and affect Neurologic: AAOx3, grossly non-focal  Labs on Admission:  Basic Metabolic Panel:  Lab 06/30/12 4098  NA 138  K 3.7  CL 98  CO2 29  GLUCOSE 126*  BUN 20  CREATININE 0.81  CALCIUM 9.9  MG --  PHOS --   Liver Function Tests:  Lab 06/30/12 2330  AST 21  ALT 20  ALKPHOS 85  BILITOT 0.2*  PROT 7.8  ALBUMIN 4.1   No results found for this basename: LIPASE:5,AMYLASE:5 in the last 168 hours No results found for this basename: AMMONIA:5 in the last 168  hours CBC:  Lab 06/30/12 2330  WBC 8.4  NEUTROABS 4.5  HGB 13.8  HCT 42.1  MCV 79.9  PLT 295   Cardiac Enzymes: No results found for this basename: CKTOTAL:5,CKMB:5,CKMBINDEX:5,TROPONINI:5 in the last 168 hours  BNP (last 3 results) No results found for this basename: PROBNP:3 in the last 8760 hours CBG: No results found for this basename: GLUCAP:5 in the last 168 hours  Radiological Exams on Admission: Dg Chest 2 View  06/30/2012  *RADIOLOGY REPORT*  Clinical Data: High blood pressure.  Chest pressure.  Headache.  CHEST - 2 VIEW  Comparison: None.  Findings: The heart size and pulmonary vascularity are normal. The lungs appear clear and expanded without focal air space disease or consolidation. No blunting of the costophrenic angles.  No pneumothorax.  Mediastinal contours appear intact.  Tortuous and calcified aorta.  Mild degenerative changes in the spine.  IMPRESSION: No evidence of active pulmonary disease.   Original Report Authenticated By: Burman Nieves, M.D.    Ct Angio Abdomen W/cm &/or Wo Contrast  07/01/2012  *RADIOLOGY REPORT*  Clinical Data:  Chest pain and pressure.  History of diabetes and hypertension.  CT ANGIOGRAPHY CHEST AND ABDOMEN  Technique:  Multidetector CT imaging through the chest and abdomen was performed using the standard protocol during bolus administration of intravenous contrast.  Multiplanar reconstructed images including MIPs were obtained and reviewed to evaluate the vascular anatomy.  Contrast: OMNIPAQUE IOHEXOL 350 MG/ML SOLN  Comparison:   None.  CTA CHEST  Findings:  Unenhanced images demonstrate normal caliber of the thoracic aorta with scattered calcifications.  No significant coronary artery calcification.  No evidence of intramural thrombus. Incidental note of a peripherally calcified nodules in the thyroid gland bilaterally.  Consider elective ultrasound followup for further evaluation.  Arterial phase contrast enhanced images obtained  demonstrate normal caliber thoracic aorta with flow throughout.  No evidence of aortic dissection.  Motion artifact in the aortic root.  Typical branching pattern of the great vessels.  Normal heart size.  Pulmonary arteries are patent without evidence of pulmonary embolus.  The esophagus is mostly decompressed.  Small esophageal hiatal hernia is present.  No abnormal mediastinal fluid collections.  No significant lymphadenopathy in the chest.  No pleural effusions.  Mild atelectasis in the lung bases.  No evidence of significant airspace or interstitial disease.  No focal consolidation.  No pneumothorax.  Airways appear patent. Degenerative changes in the thoracic spine with normal alignment. No thoracic compression fractures.   Review of the MIP images confirms the above findings.  IMPRESSION: No evidence of aortic aneurysm or dissection.  No evidence of active pulmonary disease.  CTA ABDOMEN  Findings:  Normal caliber of the abdominal  aorta with mild calcification.  No evidence of aortic dissection.  No abnormal retroperitoneal fluid collections.  The celiac axis, superior mesenteric artery, inferior mesenteric artery, and bilateral common iliac arteries are patent.  The renal nephrograms are symmetrical. There is a single right renal artery which is patent.  There are three left renal arteries, including a main renal artery and smaller arteries to the upper and lower pole, which are patent.  The liver, spleen, gallbladder, pancreas, adrenal glands, kidneys, and retroperitoneal lymph nodes are unremarkable.  The visualized portions of the stomach, small bowel, and colon are not abnormally distended.  No free air or free fluid in the abdomen.  The pelvis is not included in the study.   Review of the MIP images confirms the above findings.  IMPRESSION: No abdominal aortic aneurysm or dissection.   Original Report Authenticated By: Burman Nieves, M.D.    Ct Angio Chest Aorta W/cm &/or Wo/cm  07/01/2012   *RADIOLOGY REPORT*  Clinical Data:  Chest pain and pressure.  History of diabetes and hypertension.  CT ANGIOGRAPHY CHEST AND ABDOMEN  Technique:  Multidetector CT imaging through the chest and abdomen was performed using the standard protocol during bolus administration of intravenous contrast.  Multiplanar reconstructed images including MIPs were obtained and reviewed to evaluate the vascular anatomy.  Contrast: OMNIPAQUE IOHEXOL 350 MG/ML SOLN  Comparison:   None.  CTA CHEST  Findings:  Unenhanced images demonstrate normal caliber of the thoracic aorta with scattered calcifications.  No significant coronary artery calcification.  No evidence of intramural thrombus. Incidental note of a peripherally calcified nodules in the thyroid gland bilaterally.  Consider elective ultrasound followup for further evaluation.  Arterial phase contrast enhanced images obtained demonstrate normal caliber thoracic aorta with flow throughout.  No evidence of aortic dissection.  Motion artifact in the aortic root.  Typical branching pattern of the great vessels.  Normal heart size.  Pulmonary arteries are patent without evidence of pulmonary embolus.  The esophagus is mostly decompressed.  Small esophageal hiatal hernia is present.  No abnormal mediastinal fluid collections.  No significant lymphadenopathy in the chest.  No pleural effusions.  Mild atelectasis in the lung bases.  No evidence of significant airspace or interstitial disease.  No focal consolidation.  No pneumothorax.  Airways appear patent. Degenerative changes in the thoracic spine with normal alignment. No thoracic compression fractures.   Review of the MIP images confirms the above findings.  IMPRESSION: No evidence of aortic aneurysm or dissection.  No evidence of active pulmonary disease.  CTA ABDOMEN  Findings:  Normal caliber of the abdominal aorta with mild calcification.  No evidence of aortic dissection.  No abnormal retroperitoneal fluid collections.   The celiac axis, superior mesenteric artery, inferior mesenteric artery, and bilateral common iliac arteries are patent.  The renal nephrograms are symmetrical. There is a single right renal artery which is patent.  There are three left renal arteries, including a main renal artery and smaller arteries to the upper and lower pole, which are patent.  The liver, spleen, gallbladder, pancreas, adrenal glands, kidneys, and retroperitoneal lymph nodes are unremarkable.  The visualized portions of the stomach, small bowel, and colon are not abnormally distended.  No free air or free fluid in the abdomen.  The pelvis is not included in the study.   Review of the MIP images confirms the above findings.  IMPRESSION: No abdominal aortic aneurysm or dissection.   Original Report Authenticated By: Burman Nieves, M.D.  EKG: Independently reviewed.  Assessment/Plan Principal Problem:  *Hypertensive urgency Active Problems:  Benign hypertensive heart disease without heart failure  Chest pain   1. Hypertensive urgency - secondary to patient forgetting to take her BP meds this morning, resolved after patient took her BP meds before coming to hospital, plan to continue losartan-HCTZ and monitor BP. 2. Chest pain - negative troponin times 2, fell that clinically patient very unlikely to have ACS, CP has resolved by time of admission, will continue to cycle enzymes, no DOE, no CP on exertion, no exersize intolerance (walked 2-3 miles earlier in the day without difficulty). 3. DM2 - apparently she only takes her home amaryl on a PRN basis, will hold this for now and do accuchecks AC/HS  Cardiology consulted, they will see patient in AM.  Code Status: Full Code (must indicate code status--if unknown or must be presumed, indicate so) Family Communication: No family in room (indicate person spoken with, if applicable, with phone number if by telephone) Disposition Plan: Admit to obs (indicate anticipated  LOS)  Time spent: 70 min  Reef Achterberg M. [facility] Pager [phone]  If 7PM-7AM, please contact night-coverage [email or web address] Password [email or web address] 07/01/2012, 6:10 AM

## 2012-12-27 ENCOUNTER — Other Ambulatory Visit: Payer: Self-pay | Admitting: Cardiology

## 2013-04-04 ENCOUNTER — Other Ambulatory Visit: Payer: Self-pay | Admitting: Cardiology

## 2013-04-20 ENCOUNTER — Telehealth: Payer: Self-pay | Admitting: Cardiology

## 2013-04-20 DIAGNOSIS — E785 Hyperlipidemia, unspecified: Secondary | ICD-10-CM

## 2013-04-20 DIAGNOSIS — E119 Type 2 diabetes mellitus without complications: Secondary | ICD-10-CM

## 2013-04-20 DIAGNOSIS — E039 Hypothyroidism, unspecified: Secondary | ICD-10-CM

## 2013-04-20 NOTE — Telephone Encounter (Signed)
New message    Pt seeing dr in dec because her ins will expire at the end of the year.  She want to have "blood work" prior to appt but there is no order in computer.  Can pt have labs prior to appt?

## 2013-04-22 NOTE — Telephone Encounter (Signed)
Follow up     Pt want to have lab work before her appt with Dr Patty Sermons on 12-8.  She will loose her ins at the end of the year.  Can pt have labs drawn prior to appt?

## 2013-04-24 NOTE — Telephone Encounter (Signed)
Labs scheduled and orders in computer

## 2013-04-28 ENCOUNTER — Other Ambulatory Visit: Payer: Self-pay | Admitting: Cardiology

## 2013-05-01 ENCOUNTER — Other Ambulatory Visit (INDEPENDENT_AMBULATORY_CARE_PROVIDER_SITE_OTHER): Payer: Medicare Other

## 2013-05-01 DIAGNOSIS — E119 Type 2 diabetes mellitus without complications: Secondary | ICD-10-CM

## 2013-05-01 DIAGNOSIS — E039 Hypothyroidism, unspecified: Secondary | ICD-10-CM

## 2013-05-01 DIAGNOSIS — E785 Hyperlipidemia, unspecified: Secondary | ICD-10-CM

## 2013-05-01 LAB — HEPATIC FUNCTION PANEL
Albumin: 3.9 g/dL (ref 3.5–5.2)
Total Bilirubin: 0.6 mg/dL (ref 0.3–1.2)

## 2013-05-01 LAB — LIPID PANEL
Cholesterol: 204 mg/dL — ABNORMAL HIGH (ref 0–200)
Total CHOL/HDL Ratio: 3
Triglycerides: 61 mg/dL (ref 0.0–149.0)
VLDL: 12.2 mg/dL (ref 0.0–40.0)

## 2013-05-01 LAB — BASIC METABOLIC PANEL
CO2: 30 mEq/L (ref 19–32)
Chloride: 103 mEq/L (ref 96–112)
Glucose, Bld: 142 mg/dL — ABNORMAL HIGH (ref 70–99)
Sodium: 138 mEq/L (ref 135–145)

## 2013-05-01 LAB — HEMOGLOBIN A1C: Hgb A1c MFr Bld: 8.2 % — ABNORMAL HIGH (ref 4.6–6.5)

## 2013-05-01 LAB — LDL CHOLESTEROL, DIRECT: Direct LDL: 120.9 mg/dL

## 2013-05-03 NOTE — Progress Notes (Signed)
Quick Note:  Please make copy of labs for patient visit. ______ 

## 2013-05-04 ENCOUNTER — Ambulatory Visit (INDEPENDENT_AMBULATORY_CARE_PROVIDER_SITE_OTHER): Payer: Medicare Other | Admitting: Cardiology

## 2013-05-04 ENCOUNTER — Encounter: Payer: Self-pay | Admitting: Cardiology

## 2013-05-04 VITALS — BP 140/84 | HR 78 | Wt 160.0 lb

## 2013-05-04 DIAGNOSIS — I119 Hypertensive heart disease without heart failure: Secondary | ICD-10-CM

## 2013-05-04 DIAGNOSIS — E119 Type 2 diabetes mellitus without complications: Secondary | ICD-10-CM

## 2013-05-04 DIAGNOSIS — I359 Nonrheumatic aortic valve disorder, unspecified: Secondary | ICD-10-CM

## 2013-05-04 DIAGNOSIS — R079 Chest pain, unspecified: Secondary | ICD-10-CM

## 2013-05-04 DIAGNOSIS — E039 Hypothyroidism, unspecified: Secondary | ICD-10-CM

## 2013-05-04 MED ORDER — GLIMEPIRIDE 4 MG PO TABS: ORAL_TABLET | ORAL | Status: DC

## 2013-05-04 MED ORDER — LOSARTAN POTASSIUM-HCTZ 100-25 MG PO TABS: ORAL_TABLET | ORAL | Status: DC

## 2013-05-04 NOTE — Assessment & Plan Note (Signed)
The patient is clinically euthyroid on current therapy.  Her recent blood work is satisfactory.

## 2013-05-04 NOTE — Assessment & Plan Note (Signed)
Patient has not been expressing any severe chest pain since last visit.  Her EKG today shows no ischemic changes and is unchanged from 07/01/12

## 2013-05-04 NOTE — Assessment & Plan Note (Signed)
The patient has not been experiencing any chest pain or shortness of breath.  He has started to walk for exercise again.  She has not had any dizziness or syncope.

## 2013-05-04 NOTE — Assessment & Plan Note (Signed)
We reviewed her recent lab work.  Her hemoglobin A1c is higher at 8.2.  Now she is going to work harder on careful diet and weight loss.

## 2013-05-04 NOTE — Patient Instructions (Signed)
Your physician recommends that you continue on your current medications as directed. Please refer to the Current Medication list given to you today.  Your physician wants you to follow-up in: 6 months with fasting labs (lp/bmet/hfp/a1c)  You will receive a reminder letter in the mail two months in advance. If you don't receive a letter, please call our office to schedule the follow-up appointment.  

## 2013-05-04 NOTE — Progress Notes (Signed)
Tina [name] Date of Birth:  [birth year] [address] [place], [place]  [address] [phone]         Fax   [phone]  History of Present Illness: This pleasant 72 year old woman is seen for a scheduled followup office visit.  We last saw her in August 2013.  She has a past history of hypertension, diabetes, hypercholesterolemia, and hypothyroidism.  She also has a known murmur of aortic stenosis.  Since we last saw her she was hospitalized briefly at [facility] on 07/01/12 with hypertensive urgency.  On that brief admission she had a CT angiogram of the chest which was normal.  She also had an echocardiogram showing ejection fraction of 60% and grade 1 diastolic dysfunction.  The aortic valve was not well seen but apparently there was no significant gradient or significant aortic stenosis noted.  Over the past 8 months the patient has not been getting regular exercise.  She has not been careful with her diet.  Her weight is up 3 pounds.  Unfortunately her husband had a stroke July 4 and now requires a lot more care and Mrs. Zipper is the caregiver. Current Outpatient Prescriptions  Medication Sig Dispense Refill  . aspirin 81 MG tablet Take 81 mg by mouth daily.        . Cinnamon 500 MG capsule Take 1,000 mg by mouth 2 (two) times daily.      . fish oil-omega-3 fatty acids 1000 MG capsule Take 2 g by mouth daily. Taking occ.      Marland Kitchen glimepiride (AMARYL) 4 MG tablet TAKE 1/2 TABLET BY MOUTH DAILY OR AS DIRECTED  90 tablet  3  . losartan-hydrochlorothiazide (HYZAAR) 100-25 MG per tablet TAKE 1 TABLET BY MOUTH DAILY.  90 tablet  3  . Multiple Vitamin (MULTIVITAMIN WITH MINERALS) TABS Take 1 tablet by mouth daily.      . nitroGLYCERIN (NITROSTAT) 0.4 MG SL tablet Place 1 tablet (0.4 mg total) under the tongue every 5 (five) minutes as needed for chest pain.  10 tablet  0  . potassium gluconate 595 MG TABS Take 595 mg by mouth daily.      Marland Kitchen SYNTHROID 100 MCG tablet Take 1  tablet (100 mcg total) by mouth daily.  90 tablet  2   No current facility-administered medications for this visit.    Allergies  Allergen Reactions  . Lipitor [Atorvastatin Calcium]   . Loratadine   . Metformin And Related   . Pravachol   . Zocor [Simvastatin]     Patient Active Problem List   Diagnosis Date Noted  . Hypertensive urgency 07/01/2012  . Chest pain 07/01/2012  . Dyslipidemia 01/10/2012  . Benign hypertensive heart disease without heart failure 12/11/2010  . Hypothyroidism 12/11/2010  . Aortic valve disease 12/11/2010  . Sinusitis acute 12/11/2010  . Type II or unspecified type diabetes mellitus without mention of complication, not stated as uncontrolled 04/17/2010  . HEMORRHOIDS-EXTERNAL 04/17/2010    History  Smoking status  . Never Smoker   Smokeless tobacco  . Never Used    History  Alcohol Use No    No family history on file.  Review of Systems: Constitutional: no fever chills diaphoresis or fatigue or change in weight.  Head and neck: no hearing loss, no epistaxis, no photophobia or visual disturbance. Respiratory: No cough, shortness of breath or wheezing. Cardiovascular: No chest pain peripheral edema, palpitations. Gastrointestinal: No abdominal distention, no abdominal pain, no change in bowel habits  hematochezia or melena. Genitourinary: No dysuria, no frequency, no urgency, no nocturia. Musculoskeletal:No arthralgias, no back pain, no gait disturbance or myalgias. Neurological: No dizziness, no headaches, no numbness, no seizures, no syncope, no weakness, no tremors. Hematologic: No lymphadenopathy, no easy bruising. Psychiatric: No confusion, no hallucinations, no sleep disturbance.    Physical Exam: Filed Vitals:   05/04/13 1515  BP: 140/84  Pulse: 78   the general appearance reveals a well-developed well-nourished woman in no distress.The head and neck exam reveals pupils equal and reactive.  Extraocular movements are full.   There is no scleral icterus.  The mouth and pharynx are normal.  The neck is supple.  The carotids reveal no bruits.  The jugular venous pressure is normal.  The  thyroid is not enlarged.  There is no lymphadenopathy.  The chest is clear to percussion and auscultation.  There are no rales or rhonchi.  Expansion of the chest is symmetrical.  The precordium is quiet.  The first heart sound is normal.  The second heart sound is physiologically split.  There is no  gallop rub or click.  There is a grade 2/6 systolic ejection murmur at the base consistent with aortic stenosis which radiates to the carotids particularly on the right  There is no abnormal lift or heave.  The abdomen is soft and nontender.  The bowel sounds are normal.  The liver and spleen are not enlarged.  There are no abdominal masses.  There are no abdominal bruits.  Extremities reveal good pedal pulses.  There is no phlebitis or edema.  There is no cyanosis or clubbing.  Strength is normal and symmetrical in all extremities.  There is no lateralizing weakness.  There are no sensory deficits.  The skin is warm and dry.  There is no rash.  EKG shows normal sinus rhythm with first degree AV block and is unchanged since 07/01/12   Assessment / Plan: The patient is to continue same medication.  She is not taking her Amaryl on a regular basis but just when necessary. She will work harder on careful diet and resuming regular aerobic walking exercise.  Recheck in 6 months for office visit lipid panel hepatic function panel basal metabolic panel and A1c

## 2013-11-17 ENCOUNTER — Telehealth: Payer: Self-pay | Admitting: Cardiology

## 2013-11-17 NOTE — Telephone Encounter (Signed)
Spoke with patient and scheduled ov for tomorrow with Dawayne PatriciaLori G NP

## 2013-11-17 NOTE — Telephone Encounter (Signed)
New message          Pt is having pain in the middle of her chest / not a severe pain but an aggravation pain per pt for about 3-4 days / I have sched pt with dr Patty Sermonsbrackbill in July / pt does not believe this is an emergency

## 2013-11-18 ENCOUNTER — Encounter: Payer: Self-pay | Admitting: Nurse Practitioner

## 2013-11-18 ENCOUNTER — Ambulatory Visit (INDEPENDENT_AMBULATORY_CARE_PROVIDER_SITE_OTHER): Payer: Medicare Other | Admitting: Nurse Practitioner

## 2013-11-18 VITALS — BP 150/90 | HR 68 | Ht 64.5 in | Wt 161.4 lb

## 2013-11-18 DIAGNOSIS — R079 Chest pain, unspecified: Secondary | ICD-10-CM

## 2013-11-18 DIAGNOSIS — I209 Angina pectoris, unspecified: Secondary | ICD-10-CM

## 2013-11-18 DIAGNOSIS — E7800 Pure hypercholesterolemia, unspecified: Secondary | ICD-10-CM

## 2013-11-18 DIAGNOSIS — I119 Hypertensive heart disease without heart failure: Secondary | ICD-10-CM

## 2013-11-18 LAB — BASIC METABOLIC PANEL
BUN: 17 mg/dL (ref 6–23)
CO2: 29 mEq/L (ref 19–32)
Calcium: 9.7 mg/dL (ref 8.4–10.5)
Chloride: 98 mEq/L (ref 96–112)
Creatinine, Ser: 0.8 mg/dL (ref 0.4–1.2)
GFR: 77.65 mL/min (ref >60.00)
Glucose, Bld: 96 mg/dL (ref 70–99)
Potassium: 3.6 mEq/L (ref 3.5–5.1)
Sodium: 137 mEq/L (ref 135–145)

## 2013-11-18 LAB — CBC
HCT: 42.4 % (ref 36.0–46.0)
Hemoglobin: 13.7 g/dL (ref 12.0–15.0)
MCHC: 32.4 g/dL (ref 30.0–36.0)
MCV: 82.3 fl (ref 78.0–100.0)
Platelets: 291 10*3/uL (ref 150.0–400.0)
RBC: 5.15 Mil/uL — ABNORMAL HIGH (ref 3.87–5.11)
RDW: 15.5 % (ref 11.5–15.5)
WBC: 7.2 10*3/uL (ref 4.0–10.5)

## 2013-11-18 LAB — TROPONIN I: Troponin I: 0.01 ng/mL (ref <0.06)

## 2013-11-18 MED ORDER — METOPROLOL TARTRATE 25 MG PO TABS: 12.5000 mg | ORAL_TABLET | Freq: Two times a day (BID) | ORAL | Status: DC

## 2013-11-18 MED ORDER — NITROGLYCERIN 0.4 MG SL SUBL: 0.4000 mg | SUBLINGUAL_TABLET | SUBLINGUAL | Status: DC | PRN

## 2013-11-18 NOTE — Progress Notes (Signed)
Tina SeashoreAnita S [name] Date of Birth: 06/06/1940 Medical Record #[id number]  History of Present Illness: Tina [name] is seen back today for a work in visit. Seen for Dr. Patty [name]. She has HTN, DM, HLD and hypothyroidism. Also with AS - last echo in 2014 with EF of 60% and grade 1 diastolic dysfunction.   Last seen here in December - seemed to be doing ok - was having to give more care to her husband who had had a stroke.  Comes in today. Here alone. Called yesterday with 3 to 4 day history of chest pain. She has been under more stress over this past year with her husband's stroke. They have had to get rid of their "farm stuff" and that way of life. She is his primary caretaker. She does not sleep. She is fatigued. No real help at home. Over the last 3 to 4 days, she has had a midsternal chest heaviness that has radiated down her left arm. She has not been short of breath, not sweaty, not nauseated, etc. It has come and gone for 3 days - not really worse with exertion but she has felt "horrible". She slept most of yesterday. Feels ok today. Does not have NTG on hand. No known CAD.   Current Outpatient Prescriptions  Medication Sig Dispense Refill  . aspirin 81 MG tablet Take 81 mg by mouth daily.        . Cinnamon 500 MG capsule Take 1,000 mg by mouth 2 (two) times daily.      . fish oil-omega-3 fatty acids 1000 MG capsule Take 2 g by mouth daily. Taking occ.      Marland Kitchen. glimepiride (AMARYL) 4 MG tablet TAKE 1/2 TABLET BY MOUTH DAILY OR AS DIRECTED  90 tablet  3  . losartan-hydrochlorothiazide (HYZAAR) 100-25 MG per tablet TAKE 1 TABLET BY MOUTH DAILY.  90 tablet  3  . Multiple Vitamin (MULTIVITAMIN WITH MINERALS) TABS Take 1 tablet by mouth daily.      . potassium gluconate 595 MG TABS Take 595 mg by mouth daily.      Marland Kitchen. SYNTHROID 100 MCG tablet Take 1 tablet (100 mcg total) by mouth daily.  90 tablet  2   No current facility-administered medications for this visit.    Allergies  Allergen Reactions  .  Lipitor [Atorvastatin Calcium]   . Loratadine   . Metformin And Related   . Pravachol   . Zocor [Simvastatin]     Past Medical History  Diagnosis Date  . Diabetes mellitus   . Hypertension   . Hypothyroidism   . Hyperlipidemia   . Weakness   . Dizziness   . Heart murmur   . Dysrhythmia     " patient states she missed beats "  . Sinus infection     frequent  . NFAOZHYQ(657.8Headache(784.0)     Past Surgical History  Procedure Laterality Date  . Colonoscopy    . Goiter removal  1972  . Cardiovascular stress test  03/06/2007    EF 86%  . [place] echocardiography  10/10/2006    EF 55-60%  . Brain surgery    . Abdominal hysterectomy    . Breast surgery      tumors right breast   . Sinus exploration      History  Smoking status  . Never Smoker   Smokeless tobacco  . Never Used    History  Alcohol Use No    History reviewed. No pertinent family history.  Review of  Systems: The review of systems is per the HPI.  All other systems were reviewed and are negative.  Physical Exam: BP 150/90  Pulse 68  Ht 5' 4.5" (1.638 m)  Wt 161 lb 6.4 oz (73.211 kg)  BMI 27.29 kg/m2 Patient is very pleasant and in no acute distress. Skin is warm and dry. Color is normal.  HEENT is unremarkable. Normocephalic/atraumatic. PERRL. Sclera are nonicteric. Neck is supple. No masses. No JVD. Lungs are clear. Cardiac exam shows a regular rate and rhythm. Outflow murmur noted. Abdomen is soft. Extremities are without edema. Gait and ROM are intact. No gross neurologic deficits noted.  Wt Readings from Last 3 Encounters:  11/18/13 161 lb 6.4 oz (73.211 kg)  05/04/13 160 lb (72.576 kg)  01/10/12 157 lb (71.215 kg)    LABORATORY DATA: EKG with sinus rhythm. T wave inversion in AVL  Lab Results  Component Value Date   WBC 6.3 07/01/2012   HGB 14.2 07/01/2012   HCT 41.6 07/01/2012   PLT 264 07/01/2012   GLUCOSE 142* 05/01/2013   CHOL 204* 05/01/2013   TRIG 61.0 05/01/2013   HDL 76.30 05/01/2013   LDLDIRECT  120.9 05/01/2013   LDLCALC 102* 01/09/2012   ALT 18 05/01/2013   AST 15 05/01/2013   NA 138 05/01/2013   K 4.1 05/01/2013   CL 103 05/01/2013   CREATININE 0.7 05/01/2013   BUN 17 05/01/2013   CO2 30 05/01/2013   TSH 1.14 05/01/2013   HGBA1C 8.2* 05/01/2013    BNP (last 3 results) No results found for this basename: PROBNP,  in the last 8760 hours  Echo Study Conclusions from February 2014  Left ventricle: The cavity size was normal. Wall thickness was normal. Systolic function was normal. The estimated ejection fraction was in the range of 60% to 65%. Wall motion was normal; there were no regional wall motion abnormalities. Doppler parameters are consistent with abnormal left ventricular relaxation (grade 1 diastolic dysfunction).   Assessment / Plan:  1. Chest pain - sounds like this could be angina - has lots of risk factors - wanted to refer on for cardiac cath but she does not wish to proceed with invasive testing first - does agree to stress testing. Will give her NTG to have on hand - instructed in how to use. Start Lopressor 12.5 mg BID. Arrange for stress myoview. She is to go to the ER or call 911 for recurrent symptoms. Will check troponin today as well.   2. HTN -  BP up some - she admits that she has been forgetting some of her medicines due to the busyness with her life.  3. HLD -  Not able to take statin  4. DM  Patient is agreeable to this plan and will call if any problems develop in the interim.   Rosalio MacadamiaLori C. Gerhardt, RN, ANP-C [facility] [address] [place], [place]  [address] [phone]

## 2013-11-18 NOTE — Patient Instructions (Addendum)
Stay on your current medicines but I am adding:   Lopressor 25 mg to take 1/2 tab twice a day    NTG prn - Use your NTG under your tongue for recurrent chest pain. May take one tablet every 5 minutes. If you are still having discomfort after 3 tablets in 15 minutes, call 911.  We will check lab today  We will arrange for a stress Myoview - soon  Call the [facility] office at [phone] if you have any questions, problems or concerns.

## 2013-11-19 ENCOUNTER — Ambulatory Visit (HOSPITAL_COMMUNITY): Payer: Medicare Other | Attending: Internal Medicine | Admitting: Radiology

## 2013-11-19 VITALS — BP 159/73 | Ht 64.5 in | Wt 157.0 lb

## 2013-11-19 DIAGNOSIS — I119 Hypertensive heart disease without heart failure: Secondary | ICD-10-CM

## 2013-11-19 DIAGNOSIS — R079 Chest pain, unspecified: Secondary | ICD-10-CM | POA: Insufficient documentation

## 2013-11-19 DIAGNOSIS — I209 Angina pectoris, unspecified: Secondary | ICD-10-CM

## 2013-11-19 DIAGNOSIS — R5381 Other malaise: Secondary | ICD-10-CM | POA: Insufficient documentation

## 2013-11-19 DIAGNOSIS — R51 Headache: Secondary | ICD-10-CM

## 2013-11-19 DIAGNOSIS — R5383 Other fatigue: Secondary | ICD-10-CM

## 2013-11-19 DIAGNOSIS — E7800 Pure hypercholesterolemia, unspecified: Secondary | ICD-10-CM

## 2013-11-19 MED ORDER — TECHNETIUM TC 99M SESTAMIBI GENERIC - CARDIOLITE
33.0000 | Freq: Once | INTRAVENOUS | Status: AC | PRN
  Administered 2013-11-19: 33 via INTRAVENOUS

## 2013-11-19 MED ORDER — TECHNETIUM TC 99M SESTAMIBI GENERIC - CARDIOLITE
11.0000 | Freq: Once | INTRAVENOUS | Status: AC | PRN
Start: 2013-11-19 — End: 2013-11-19
  Administered 2013-11-19: 11 via INTRAVENOUS

## 2013-11-19 MED ORDER — REGADENOSON 0.4 MG/5ML IV SOLN
0.4000 mg | Freq: Once | INTRAVENOUS | Status: AC
  Administered 2013-11-19: 0.4 mg via INTRAVENOUS

## 2013-11-19 MED ORDER — AMINOPHYLLINE 25 MG/ML IV SOLN
75.0000 mg | Freq: Once | INTRAVENOUS | Status: AC
  Administered 2013-11-19: 75 mg via INTRAVENOUS

## 2013-11-19 NOTE — Progress Notes (Signed)
[facility] [address] NUCLEAR MED [address] [place], [place] [address] [phone]    Cardiology Nuclear Med Study  Merrilee Seashorenita S Poer is a 73 y.o. female     MRN : [id number]     DOB: 09/25/1940  Procedure Date: 11/19/2013  Nuclear Med Background Indication for Stress Test:  Evaluation for Ischemia History:No Known History of CAD;Echo 14' EF:60%;Previous Nuclear Study 08 'EF: 86% Cardiac Risk Factors: Hypertension, Lipids and NIDDM  Symptoms:  Chest Pain and Fatigue   Nuclear Pre-Procedure Caffeine/Decaff Intake:  None> 12 hrs NPO After: 7:00pm   Lungs:  clear O2 Sat: 96% on room air. IV 0.9% NS with Angio Cath:  24g  IV Site: R Forearm , tolerated well IV Started by:  Irean HongPatsy Edwards, RN  Chest Size (in):  44 Cup Size: C  Height: 5' 4.5" (1.638 m)  Weight:  157 lb (71.215 kg)  BMI:  Body mass index is 26.54 kg/(m^2). Tech Comments:  No Amaryl or Lopressor medications this am. Irean HongPatsy Edwards, RN. Aminophylline 75 mg IVP given at 1505 post recovery due to persistent headache with improvement of symptoms. Irean HongPatsy Edwards, RN.    Nuclear Med Study 1 or 2 day study: 1 day  Stress Test Type:  Treadmill/Lexiscan  Reading MD: N/A  Order Authorizing Provider:  Cassell Clementhomas Brackbill, MD  Resting Radionuclide: Technetium 3259m Sestamibi  Resting Radionuclide Dose: 11.0 mCi   Stress Radionuclide:  Technetium 5259m Sestamibi  Stress Radionuclide Dose: 33.0 mCi           Stress Protocol Rest HR: 64 Stress HR: 133  Rest BP: 159/73 Stress BP: 180/68  Exercise Time (min): 2:00 METS: n/a   Predicted Max HR: 145 bpm % Max HR: 91.72 bpm Rate Pressure Product: [id number]   Dose of Adenosine (mg):  n/a Dose of Lexiscan: 0.4 mg  Dose of Atropine (mg): n/a Dose of Dobutamine: n/a mcg/kg/min (at max HR)  Stress Test Technologist: Frederick Peerseresa Johnson, EMT-P  Nuclear Technologist:  Domenic PoliteStephen Carbone, CNMT     Rest Procedure:  Myocardial perfusion imaging was performed at rest 45 minutes following the  intravenous administration of Technetium 2459m Sestamibi. Rest ECG: NSR - Normal EKG  Stress Procedure:  The patient received IV Lexiscan 0.4 mg over 15-seconds with concurrent low level exercise and then Technetium 4659m Sestamibi was injected at 30-seconds while the patient continued walking one more minute.  Quantitative spect images were obtained after a 45-minute delay. Stress ECG: No significant change from baseline ECG  QPS Raw Data Images:  Normal; no motion artifact; normal heart/lung ratio. Stress Images:  Normal homogeneous uptake in all areas of the myocardium. Rest Images:  Normal homogeneous uptake in all areas of the myocardium. Subtraction (SDS):  No evidence of ischemia. Transient Ischemic Dilatation (Normal <1.22):  1.03 Lung/Heart Ratio (Normal <0.45):  0.26  Quantitative Gated Spect Images QGS EDV:  60 ml QGS ESV:  14 ml  Impression Exercise Capacity:  Lexiscan with low level exercise. BP Response:  Normal blood pressure response. Clinical Symptoms:  No significant symptoms noted. ECG Impression:  No significant ST segment change suggestive of ischemia. Comparison with Prior Nuclear Study: No images to compare  Overall Impression:  Normal stress nuclear study.  LV Ejection Fraction: 77%.  LV Wall Motion:  NL LV Function; NL Wall Motion  Signed: Armanda Magicraci Turner, MD [facility]

## 2013-11-25 ENCOUNTER — Other Ambulatory Visit: Payer: Self-pay | Admitting: Cardiology

## 2013-12-16 ENCOUNTER — Ambulatory Visit (INDEPENDENT_AMBULATORY_CARE_PROVIDER_SITE_OTHER): Payer: Medicare Other | Admitting: Cardiology

## 2013-12-16 ENCOUNTER — Ambulatory Visit (HOSPITAL_BASED_OUTPATIENT_CLINIC_OR_DEPARTMENT_OTHER): Payer: Medicare Other | Admitting: *Deleted

## 2013-12-16 ENCOUNTER — Encounter: Payer: Self-pay | Admitting: Cardiology

## 2013-12-16 ENCOUNTER — Ambulatory Visit (HOSPITAL_COMMUNITY): Payer: Medicare Other | Attending: Cardiology | Admitting: *Deleted

## 2013-12-16 VITALS — BP 140/80 | HR 66 | Ht 64.5 in | Wt 163.0 lb

## 2013-12-16 DIAGNOSIS — I059 Rheumatic mitral valve disease, unspecified: Secondary | ICD-10-CM | POA: Insufficient documentation

## 2013-12-16 DIAGNOSIS — E785 Hyperlipidemia, unspecified: Secondary | ICD-10-CM | POA: Diagnosis not present

## 2013-12-16 DIAGNOSIS — R079 Chest pain, unspecified: Secondary | ICD-10-CM | POA: Diagnosis present

## 2013-12-16 DIAGNOSIS — I10 Essential (primary) hypertension: Secondary | ICD-10-CM | POA: Insufficient documentation

## 2013-12-16 DIAGNOSIS — E119 Type 2 diabetes mellitus without complications: Secondary | ICD-10-CM | POA: Insufficient documentation

## 2013-12-16 DIAGNOSIS — R0989 Other specified symptoms and signs involving the circulatory and respiratory systems: Secondary | ICD-10-CM

## 2013-12-16 DIAGNOSIS — I119 Hypertensive heart disease without heart failure: Secondary | ICD-10-CM

## 2013-12-16 DIAGNOSIS — I6529 Occlusion and stenosis of unspecified carotid artery: Secondary | ICD-10-CM

## 2013-12-16 DIAGNOSIS — R599 Enlarged lymph nodes, unspecified: Secondary | ICD-10-CM | POA: Diagnosis not present

## 2013-12-16 DIAGNOSIS — R011 Cardiac murmur, unspecified: Secondary | ICD-10-CM

## 2013-12-16 DIAGNOSIS — I359 Nonrheumatic aortic valve disorder, unspecified: Secondary | ICD-10-CM

## 2013-12-16 NOTE — Assessment & Plan Note (Signed)
Her previous chest pains have resolved.  It appears that the previous chest pains would do in large part to the extreme emotional stress she was undergoing last month

## 2013-12-16 NOTE — Patient Instructions (Signed)
Your physician recommends that you continue on your current medications as directed. Please refer to the Current Medication list given to you today.  Your physician recommends that you schedule a follow-up appointment in: 4 month ov/ekg   Your physician has requested that you have an echocardiogram. Echocardiography is a painless test that uses sound waves to create images of your heart. It provides your doctor with information about the size and shape of your heart and how well your heart's chambers and valves are working. This procedure takes approximately one hour. There are no restrictions for this procedure.  Your physician has requested that you have a carotid duplex. This test is an ultrasound of the carotid arteries in your neck. It looks at blood flow through these arteries that supply the brain with blood. Allow one hour for this exam. There are no restrictions or special instructions.

## 2013-12-16 NOTE — Progress Notes (Signed)
Echocardiogram Complete 

## 2013-12-16 NOTE — Assessment & Plan Note (Signed)
The patient is a right carotid bruit on today's exam.  She recalls that she was told in the past after a screening physical examination at her church that she had some problems with her carotid arteries.  We will get a carotid duplex ultrasound.  She has not had any TIA symptoms.

## 2013-12-16 NOTE — Progress Notes (Signed)
[name] Date of Birth:  April 06, 1941 [facility] [address] [place], [place]  [address] [phone]        Fax   [phone]   History of Present Illness: This pleasant 73 year old woman is seen for a scheduled followup office visit.  She has a past history of hypertension, diabetes, hypercholesterolemia, and hypothyroidism. She also has a known murmur of aortic stenosis.  She was hospitalized briefly at [facility] on 07/01/12 with hypertensive urgency. On that brief admission she had a CT angiogram of the chest which was normal. She also had an echocardiogram in February 2014 showing ejection fraction of 60% and grade 1 diastolic dysfunction. The aortic valve was not well seen but  there was no significant gradient or significant aortic stenosis noted.  She was seen as a work in with increasing chest discomfort by Norma Fredrickson on 11/18/13.  She had a Myoview stress test on 11/19/13 which was normal.  It showed an ejection fraction of 77% and no wall motion abnormalities or ischemia. Since her work in visit last month the patient continues to be under a lot of stress at home but her chest discomfort has resolved.  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  They have sold their boat.  They are putting her beach house up for sale.  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  Her husband who had a significant stroke has made some recovery with physical therapy and is now able to walk with a walker I believe.  Current Outpatient Prescriptions  Medication Sig Dispense Refill  . aspirin 81 MG tablet Take 81 mg by mouth daily.        . Cinnamon 500 MG capsule Take 1,000 mg by mouth 2 (two) times daily.      . fish oil-omega-3 fatty acids 1000 MG capsule Take 2 g by mouth daily. Taking occ.      Marland Kitchen glimepiride (AMARYL) 4 MG tablet TAKE 1/2 TABLET BY MOUTH DAILY OR AS DIRECTED  90 tablet  3  . losartan-hydrochlorothiazide (HYZAAR) 100-25 MG  per tablet TAKE 1 TABLET BY MOUTH DAILY.  90 tablet  3  . Multiple Vitamin (MULTIVITAMIN WITH MINERALS) TABS Take 1 tablet by mouth daily.      . nitroGLYCERIN (NITROSTAT) 0.4 MG SL tablet Place 1 tablet (0.4 mg total) under the tongue every 5 (five) minutes as needed for chest pain.  25 tablet  6  . potassium gluconate 595 MG TABS Take 595 mg by mouth daily.      Marland Kitchen SYNTHROID 100 MCG tablet TAKE 1 TABLET (100 MCG TOTAL) BY MOUTH DAILY.  90 tablet  1   No current facility-administered medications for this visit.    Allergies  Allergen Reactions  . Lipitor [Atorvastatin Calcium]   . Loratadine   . Metformin And Related   . Pravachol   . Zocor [Simvastatin]     Patient Active Problem List   Diagnosis Date Noted  . Right carotid bruit 12/16/2013  . Hypertensive urgency 07/01/2012  . Chest pain 07/01/2012  . Dyslipidemia 01/10/2012  . Benign hypertensive heart disease without heart failure 12/11/2010  . Hypothyroidism 12/11/2010  . Aortic valve disease 12/11/2010  . Sinusitis acute 12/11/2010  . Type II or unspecified type diabetes mellitus without mention of complication, not stated as uncontrolled 04/17/2010  . HEMORRHOIDS-EXTERNAL 04/17/2010    History  Smoking status  . Never Smoker  Smokeless tobacco  . Never Used    History  Alcohol Use No    Family History  Problem Relation Age of Onset  . Heart disease Mother     Review of Systems: Constitutional: no fever chills diaphoresis or fatigue or change in weight.  Head and neck: no hearing loss, no epistaxis, no photophobia or visual disturbance. Respiratory: No cough, shortness of breath or wheezing. Cardiovascular: No chest pain peripheral edema, palpitations. Gastrointestinal: No abdominal distention, no abdominal pain, no change in bowel habits hematochezia or melena. Genitourinary: No dysuria, no frequency, no urgency, no nocturia. Musculoskeletal:No arthralgias, no back pain, no gait disturbance or  myalgias. Neurological: No dizziness, no headaches, no numbness, no seizures, no syncope, no weakness, no tremors. Hematologic: No lymphadenopathy, no easy bruising. Psychiatric: No confusion, no hallucinations, no sleep disturbance.    Physical Exam: Filed Vitals:   12/16/13 1125  BP: 140/80  Pulse:    the general appearance reveals a well-developed well-nourished middle-aged woman in no distress.The head and neck exam reveals pupils equal and reactive.  Extraocular movements are full.  There is no scleral icterus.  The mouth and pharynx are normal.  The neck is supple.  The carotids reveal moderate right carotid bruit. The jugular venous pressure is normal.  The  thyroid is not enlarged.  There is no lymphadenopathy.  The chest is clear to percussion and auscultation.  There are no rales or rhonchi.  Expansion of the chest is symmetrical.  The precordium is quiet.  The first heart sound is normal.  The second heart sound is physiologically split.  There is grade 2/6 harsh systolic ejection murmur of aortic sclerosis or stenosis.  There is no abnormal lift or heave.  The abdomen is soft and nontender.  The bowel sounds are normal.  The liver and spleen are not enlarged.  There are no abdominal masses.  There are no abdominal bruits.  Extremities reveal good pedal pulses.  There is no phlebitis or edema.  There is no cyanosis or clubbing.  Strength is normal and symmetrical in all extremities.  There is no lateralizing weakness.  There are no sensory deficits.  The skin is warm and dry.  There is no rash.     Assessment / Plan: 1. atypical chest pain.  Recent normal Myoview stress test 11/19/13 showing ejection fraction 77% and no ischemia. 2. history of diastolic dysfunction by prior echocardiogram 3. aortic valve disease with murmur of aortic stenosis. 4.  Right carotid bruit, asymptomatic 5. Dyslipidemia 6.  Diabetes mellitus 7. hypertensive heart disease without heart failure  Plan:  Continue same medication.  Recheck in 4 months for office visit and EKG.  Meanwhile check carotid duplex and check 2-D echo regarding aortic valve.

## 2013-12-16 NOTE — Progress Notes (Signed)
Carotid duplex complete 

## 2013-12-16 NOTE — Assessment & Plan Note (Signed)
The patient complains of lack of energy.  She complains of staying tired.  She has not had any overt symptoms of CHF.  She has a significant heart murmur and we will update her echo.

## 2013-12-21 ENCOUNTER — Telehealth: Payer: Self-pay | Admitting: *Deleted

## 2013-12-21 MED ORDER — METOPROLOL SUCCINATE ER 25 MG PO TB24: 25.0000 mg | ORAL_TABLET | Freq: Every day | ORAL | Status: DC

## 2013-12-21 NOTE — Telephone Encounter (Signed)
Message copied by Burnell BlanksPRATT, Bradie Sangiovanni B on Mon Dec 21, 2013  2:01 PM ------      Message from: Cassell ClementBRACKBILL, THOMAS      Created: Wed Dec 16, 2013  7:13 PM       Please report.  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  I would like her to start a beta blocker Toprol-XL 25 one daily ------

## 2013-12-21 NOTE — Telephone Encounter (Signed)
Message copied by Burnell BlanksPRATT, Jameeka Marcy B on Mon Dec 21, 2013  2:01 PM ------      Message from: Cassell ClementBRACKBILL, THOMAS      Created: Wed Dec 16, 2013  7:10 PM       Please report.  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  This is not severe enough to require surgery at this time.  Recheck carotid duplex in one year.  Work harder on risk factor reduction, diabetes control etc. ------

## 2013-12-21 NOTE — Telephone Encounter (Signed)
Advised patient of doppler and echo

## 2014-04-19 ENCOUNTER — Ambulatory Visit: Payer: Medicare Other | Admitting: Cardiology

## 2014-06-21 ENCOUNTER — Telehealth: Payer: Self-pay | Admitting: Cardiology

## 2014-06-21 DIAGNOSIS — E039 Hypothyroidism, unspecified: Secondary | ICD-10-CM

## 2014-06-21 DIAGNOSIS — I119 Hypertensive heart disease without heart failure: Secondary | ICD-10-CM

## 2014-06-21 DIAGNOSIS — E119 Type 2 diabetes mellitus without complications: Secondary | ICD-10-CM

## 2014-06-21 NOTE — Telephone Encounter (Signed)
I spoke with the patient and advised her there are no lab orders in for her or repeat labs mentioned in the last office note with Dr. Patty SermonsBrackbill. The patient states she has had some thyroid abnormalities and is diabetic. She has no other doctor besides Dr. Patty SermonsBrackbill. She does not want to come on 07/06/14 to follow up with Dr. Patty SermonsBrackbill if she can't have labs prior. I advised the patient that he may want to order a CMET/ CBC/ TSH/ Lipid/ and HgBA1C on her, but will send to Dr. Patty SermonsBrackbill to review and make recommendations. We will call her back once MD reviews and schedule labs for her. She is agreeable.

## 2014-06-21 NOTE — Telephone Encounter (Signed)
The patient is aware that we will draw her labs- ordered as per Dr. Patty SermonsBrackbill.

## 2014-06-21 NOTE — Telephone Encounter (Signed)
Please have her get a CBC, Lipid panel, BMET, HFP, TSH and A1C prior to her OV.

## 2014-06-21 NOTE — Telephone Encounter (Signed)
New Message  Pt requested to speak w/ rn about possible lab to be drawn before or on her 2/9 appt w/ Brackbill. No orders are present. Please call back and discuss.

## 2014-06-23 ENCOUNTER — Ambulatory Visit: Payer: Medicare Other | Admitting: Cardiology

## 2014-07-01 ENCOUNTER — Other Ambulatory Visit (INDEPENDENT_AMBULATORY_CARE_PROVIDER_SITE_OTHER): Payer: Medicare Other | Admitting: *Deleted

## 2014-07-01 DIAGNOSIS — I119 Hypertensive heart disease without heart failure: Secondary | ICD-10-CM

## 2014-07-01 DIAGNOSIS — E039 Hypothyroidism, unspecified: Secondary | ICD-10-CM

## 2014-07-01 DIAGNOSIS — E119 Type 2 diabetes mellitus without complications: Secondary | ICD-10-CM

## 2014-07-01 LAB — CBC WITH DIFFERENTIAL/PLATELET
BASOS ABS: 0 10*3/uL (ref 0.0–0.1)
Basophils Relative: 0.4 % (ref 0.0–3.0)
Eosinophils Absolute: 0.4 10*3/uL (ref 0.0–0.7)
Eosinophils Relative: 6.9 % — ABNORMAL HIGH (ref 0.0–5.0)
HCT: 39.3 % (ref 36.0–46.0)
HEMOGLOBIN: 13 g/dL (ref 12.0–15.0)
Lymphocytes Relative: 32.2 % (ref 12.0–46.0)
Lymphs Abs: 1.8 10*3/uL (ref 0.7–4.0)
MCHC: 33.1 g/dL (ref 30.0–36.0)
MCV: 78.7 fl (ref 78.0–100.0)
MONOS PCT: 9.2 % (ref 3.0–12.0)
Monocytes Absolute: 0.5 10*3/uL (ref 0.1–1.0)
NEUTROS ABS: 2.9 10*3/uL (ref 1.4–7.7)
NEUTROS PCT: 51.3 % (ref 43.0–77.0)
Platelets: 314 10*3/uL (ref 150.0–400.0)
RBC: 4.99 Mil/uL (ref 3.87–5.11)
RDW: 15.7 % — ABNORMAL HIGH (ref 11.5–15.5)
WBC: 5.7 10*3/uL (ref 4.0–10.5)

## 2014-07-01 LAB — LIPID PANEL
CHOL/HDL RATIO: 3
CHOLESTEROL: 217 mg/dL — AB (ref 0–200)
HDL: 70.9 mg/dL (ref >39.00)
LDL CALC: 129 mg/dL — AB (ref 0–99)
NONHDL: 146.1
Triglycerides: 84 mg/dL (ref 0.0–149.0)
VLDL: 16.8 mg/dL (ref 0.0–40.0)

## 2014-07-01 LAB — BASIC METABOLIC PANEL
BUN: 15 mg/dL (ref 6–23)
CALCIUM: 9.5 mg/dL (ref 8.4–10.5)
CHLORIDE: 101 meq/L (ref 96–112)
CO2: 28 mEq/L (ref 19–32)
CREATININE: 0.87 mg/dL (ref 0.40–1.20)
GFR: 67.33 mL/min (ref >60.00)
Glucose, Bld: 161 mg/dL — ABNORMAL HIGH (ref 70–99)
POTASSIUM: 4.2 meq/L (ref 3.5–5.1)
Sodium: 138 mEq/L (ref 135–145)

## 2014-07-01 LAB — HEPATIC FUNCTION PANEL
ALK PHOS: 80 U/L (ref 39–117)
ALT: 18 U/L (ref 0–35)
AST: 17 U/L (ref 0–37)
Albumin: 3.9 g/dL (ref 3.5–5.2)
BILIRUBIN TOTAL: 0.4 mg/dL (ref 0.2–1.2)
Bilirubin, Direct: 0.1 mg/dL (ref 0.0–0.3)
Total Protein: 6.8 g/dL (ref 6.0–8.3)

## 2014-07-01 LAB — TSH: TSH: 1.2 u[IU]/mL (ref 0.35–4.50)

## 2014-07-01 LAB — HEMOGLOBIN A1C: Hgb A1c MFr Bld: 9 % — ABNORMAL HIGH (ref 4.6–6.5)

## 2014-07-01 NOTE — Progress Notes (Signed)
Quick Note:  Please make copy of labs for patient visit. ______ 

## 2014-07-06 ENCOUNTER — Encounter: Payer: Self-pay | Admitting: Cardiology

## 2014-07-06 ENCOUNTER — Ambulatory Visit (INDEPENDENT_AMBULATORY_CARE_PROVIDER_SITE_OTHER): Payer: Medicare Other | Admitting: Cardiology

## 2014-07-06 VITALS — BP 162/96 | HR 63 | Ht 64.5 in | Wt 158.8 lb

## 2014-07-06 DIAGNOSIS — I119 Hypertensive heart disease without heart failure: Secondary | ICD-10-CM

## 2014-07-06 DIAGNOSIS — R0989 Other specified symptoms and signs involving the circulatory and respiratory systems: Secondary | ICD-10-CM

## 2014-07-06 DIAGNOSIS — I359 Nonrheumatic aortic valve disorder, unspecified: Secondary | ICD-10-CM

## 2014-07-06 DIAGNOSIS — E119 Type 2 diabetes mellitus without complications: Secondary | ICD-10-CM

## 2014-07-06 MED ORDER — METOPROLOL SUCCINATE ER 25 MG PO TB24: 25.0000 mg | ORAL_TABLET | Freq: Every day | ORAL | Status: DC

## 2014-07-06 MED ORDER — SYNTHROID 100 MCG PO TABS: ORAL_TABLET | ORAL | Status: DC

## 2014-07-06 MED ORDER — GLIMEPIRIDE 4 MG PO TABS: ORAL_TABLET | ORAL | Status: DC

## 2014-07-06 NOTE — Progress Notes (Signed)
Cardiology Office Note   Date:  07/06/2014   ID:  Tina [name], DOB 09/07/1940, MRN [id number]  PCP:  Kaleen MaskELKINS,WILSON OLIVER, MD  Cardiologist:   Cassell Clementhomas Kindal Ponti, MD   No chief complaint on file.     History of Present Illness: Tina [name] is a 74 y.o. female who presents for scheduled follow-up office visit.  This pleasant 74 year old woman is seen for a scheduled followup office visit. She has a past history of hypertension, diabetes, hypercholesterolemia, and hypothyroidism. She also has a known murmur of aortic stenosis. She was hospitalized briefly at [facility] on 07/01/12 with hypertensive urgency. On that brief admission she had a CT angiogram of the chest which was normal. She also had an echocardiogram in February 2014 showing ejection fraction of 60% and grade 1 diastolic dysfunction. The aortic valve was not well seen but there was no significant gradient or significant aortic stenosis noted. She was seen as a work in with increasing chest discomfort by Norma FredricksonLori Gerhardt on 11/18/13. She had a Myoview stress test on 11/19/13 which was normal. It showed an ejection fraction of 77% and no wall motion abnormalities or ischemia. Since her work in visit last month the patient continues to be under a lot of stress at home but her chest discomfort has resolved.  Her husband who had a significant stroke has made some recovery with physical therapy and is now able to walk with a walker I believe.  However he is prone to falling and in fact fell today. Two-dimensional echocardiogram on 12/16/13 showed vigorous left ventricular systolic function with ejection fraction 65-70% and dynamic outflow obstruction.  She was started on a beta blocker. Carotid duplex ultrasound showed bilateral 40-59% stenosis.  Continue current medical therapy. The patient has not been experiencing any chest pain.  She has felt very stressed.  Her blood pressure today is elevated because of the stress of her  husband's illness.  Also, she did not take any of her blood pressure medicines yet today.    Past Medical History  Diagnosis Date  . Diabetes mellitus   . Hypertension   . Hypothyroidism   . Hyperlipidemia   . Weakness   . Dizziness   . Heart murmur   . Dysrhythmia     " patient states she missed beats "  . Sinus infection     frequent  . AOZHYQMV(784.6Headache(784.0)     Past Surgical History  Procedure Laterality Date  . Colonoscopy    . Goiter removal  1972  . Cardiovascular stress test  03/06/2007    EF 86%  . [place] echocardiography  10/10/2006    EF 55-60%  . Brain surgery    . Abdominal hysterectomy    . Breast surgery      tumors right breast   . Sinus exploration       Current Outpatient Prescriptions  Medication Sig Dispense Refill  . aspirin 81 MG tablet Take 81 mg by mouth daily.      . Cinnamon 500 MG capsule Take 2,000 mg by mouth as needed.     . fish oil-omega-3 fatty acids 1000 MG capsule Take 2 g by mouth daily. Taking occ.    Marland Kitchen. glimepiride (AMARYL) 4 MG tablet TAKE 1/2 TABLET BY MOUTH DAILY OR AS DIRECTED 90 tablet 3  . losartan-hydrochlorothiazide (HYZAAR) 100-25 MG per tablet TAKE 1 TABLET BY MOUTH DAILY. 90 tablet 3  . metoprolol succinate (TOPROL XL) 25 MG 24 hr tablet Take 1  tablet (25 mg total) by mouth daily. 90 tablet 3  . Multiple Vitamin (MULTIVITAMIN WITH MINERALS) TABS Take 1 tablet by mouth daily.    . nitroGLYCERIN (NITROSTAT) 0.4 MG SL tablet Place 1 tablet (0.4 mg total) under the tongue every 5 (five) minutes as needed for chest pain. 25 tablet 6  . potassium gluconate 595 MG TABS Take 595 mg by mouth daily.    Marland Kitchen SYNTHROID 100 MCG tablet TAKE 1 TABLET (100 MCG TOTAL) BY MOUTH DAILY. 90 tablet 1   No current facility-administered medications for this visit.    Allergies:   Lipitor; Loratadine; Metformin and related; Pravachol; and Zocor    Social History:  The patient  reports that she has never smoked. She has never used smokeless tobacco. She  reports that she does not drink alcohol or use illicit drugs.   Family History:  The patient's family history includes Heart disease in her mother.    ROS:  Please see the history of present illness.   Otherwise, review of systems are positive for none.   All other systems are reviewed and negative.    PHYSICAL EXAM: VS:  BP 162/96 mmHg  Pulse 63  Ht 5' 4.5" (1.638 m)  Wt 158 lb 12.8 oz (72.031 kg)  BMI 26.85 kg/m2 , BMI Body mass index is 26.85 kg/(m^2). GEN: Well nourished, well developed, in no acute distress HEENT: normal Neck: no JVD, carotid bruits, or masses Cardiac: RRR; no murmurs, rubs, or gallops,no edema  Respiratory:  clear to auscultation bilaterally, normal work of breathing GI: soft, nontender, nondistended, + BS MS: no deformity or atrophy Skin: warm and dry, no rash Neuro:  Strength and sensation are intact Psych: euthymic mood, full affect   EKG:  EKG is ordered today. The ekg ordered today demonstrates normal sinus rhythm with sinus arrhythmia.  No ischemic changes.   Recent Labs: 07/01/2014: ALT 18; BUN 15; Creatinine 0.87; Hemoglobin 13.0; Platelets 314.0; Potassium 4.2; Sodium 138; TSH 1.20    Lipid Panel    Component Value Date/Time   CHOL 217* 07/01/2014 0744   TRIG 84.0 07/01/2014 0744   HDL 70.90 07/01/2014 0744   CHOLHDL 3 07/01/2014 0744   VLDL 16.8 07/01/2014 0744   LDLCALC 129* 07/01/2014 0744   LDLDIRECT 120.9 05/01/2013 0929      Wt Readings from Last 3 Encounters:  07/06/14 158 lb 12.8 oz (72.031 kg)  12/16/13 163 lb (73.936 kg)  11/19/13 157 lb (71.215 kg)     ASSESSMENT AND PLAN:  1. atypical chest pain. Recent normal Myoview stress test 11/19/13 showing ejection fraction 77% and no ischemia. 2. history of diastolic dysfunction by prior echocardiogram 3. aortic valve disease with murmur of aortic stenosis.  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 4. Right carotid  bruit, asymptomatic.  Carotid duplex shows no severe stenosis 5. Dyslipidemia 6. Diabetes mellitus 7. hypertensive heart disease without heart failure   Current medicines are reviewed at length with the patient today.  The patient does not have concerns regarding medicines.  The following changes have been made:  no change  Labs/ tests ordered today include: None   Orders Placed This Encounter  Procedures  . Lipid panel  . Hepatic function panel  . Basic metabolic panel  . Hemoglobin A1c  . EKG 12-Lead     Disposition:   FU with Dr. Patty Sermons in 4 months for office visit, A1c, lipid panel, hepatic function panel, and  basal metabolic panel.  Her most recent A1c was higher.  She has not been careful with her diet.   Signed, Cassell Clement, MD  07/06/2014 1:23 PM    [facility] [address] [place], [place], [place]  [address] Phone: [phone]; Fax: [phone]

## 2014-07-06 NOTE — Patient Instructions (Signed)
Your physician recommends that you continue on your current medications as directed. Please refer to the Current Medication list given to you today.  Your physician recommends that you schedule a follow-up appointment in: 4 months with fasting labs (lp/bmet/hfp/a1c)  

## 2014-12-27 ENCOUNTER — Encounter: Payer: Self-pay | Admitting: Cardiology

## 2015-02-03 ENCOUNTER — Other Ambulatory Visit: Payer: Self-pay | Admitting: *Deleted

## 2015-02-03 DIAGNOSIS — I119 Hypertensive heart disease without heart failure: Secondary | ICD-10-CM

## 2015-02-03 MED ORDER — LOSARTAN POTASSIUM-HCTZ 100-25 MG PO TABS: ORAL_TABLET | ORAL | Status: DC

## 2015-02-23 ENCOUNTER — Encounter: Payer: Self-pay | Admitting: Internal Medicine

## 2015-03-04 ENCOUNTER — Other Ambulatory Visit (INDEPENDENT_AMBULATORY_CARE_PROVIDER_SITE_OTHER): Payer: Medicare Other | Admitting: *Deleted

## 2015-03-04 DIAGNOSIS — I119 Hypertensive heart disease without heart failure: Secondary | ICD-10-CM | POA: Diagnosis not present

## 2015-03-04 DIAGNOSIS — E119 Type 2 diabetes mellitus without complications: Secondary | ICD-10-CM

## 2015-03-04 LAB — LIPID PANEL
Cholesterol: 186 mg/dL (ref 125–200)
HDL: 58 mg/dL (ref >=46)
LDL CALC: 110 mg/dL (ref <130)
TRIGLYCERIDES: 92 mg/dL (ref <150)
Total CHOL/HDL Ratio: 3.2 Ratio (ref <=5.0)
VLDL: 18 mg/dL (ref <30)

## 2015-03-04 LAB — BASIC METABOLIC PANEL
BUN: 13 mg/dL (ref 7–25)
CO2: 24 mmol/L (ref 20–31)
Calcium: 8.8 mg/dL (ref 8.6–10.4)
Chloride: 105 mmol/L (ref 98–110)
Creat: 0.7 mg/dL (ref 0.60–0.93)
Glucose, Bld: 154 mg/dL — ABNORMAL HIGH (ref 65–99)
POTASSIUM: 4.2 mmol/L (ref 3.5–5.3)
SODIUM: 137 mmol/L (ref 135–146)

## 2015-03-04 LAB — HEMOGLOBIN A1C
HEMOGLOBIN A1C: 8.3 % — AB (ref <5.7)
MEAN PLASMA GLUCOSE: 192 mg/dL — AB (ref <117)

## 2015-03-04 LAB — HEPATIC FUNCTION PANEL
ALT: 13 U/L (ref 6–29)
AST: 15 U/L (ref 10–35)
Albumin: 3.7 g/dL (ref 3.6–5.1)
Alkaline Phosphatase: 63 U/L (ref 33–130)
BILIRUBIN INDIRECT: 0.3 mg/dL (ref 0.2–1.2)
Bilirubin, Direct: 0.1 mg/dL (ref <=0.2)
TOTAL PROTEIN: 6.4 g/dL (ref 6.1–8.1)
Total Bilirubin: 0.4 mg/dL (ref 0.2–1.2)

## 2015-03-04 NOTE — Addendum Note (Signed)
Addended by: Tonita Phoenix on: 03/04/2015 08:06 AM   Modules accepted: Orders

## 2015-03-04 NOTE — Progress Notes (Signed)
Quick Note:  Please make copy of labs for patient visit. ______ 

## 2015-03-06 NOTE — Progress Notes (Signed)
Quick Note:  Please make copy of labs for patient visit. ______ 

## 2015-03-11 ENCOUNTER — Encounter: Payer: Self-pay | Admitting: Cardiology

## 2015-03-11 ENCOUNTER — Ambulatory Visit (INDEPENDENT_AMBULATORY_CARE_PROVIDER_SITE_OTHER): Payer: Medicare Other | Admitting: Cardiology

## 2015-03-11 VITALS — BP 140/80 | HR 86 | Ht 62.0 in | Wt 158.8 lb

## 2015-03-11 DIAGNOSIS — E119 Type 2 diabetes mellitus without complications: Secondary | ICD-10-CM | POA: Diagnosis not present

## 2015-03-11 DIAGNOSIS — I119 Hypertensive heart disease without heart failure: Secondary | ICD-10-CM | POA: Diagnosis not present

## 2015-03-11 DIAGNOSIS — I359 Nonrheumatic aortic valve disorder, unspecified: Secondary | ICD-10-CM | POA: Diagnosis not present

## 2015-03-11 DIAGNOSIS — R079 Chest pain, unspecified: Secondary | ICD-10-CM | POA: Diagnosis not present

## 2015-03-11 NOTE — Progress Notes (Signed)
Cardiology Office Note   Date:  03/11/2015   ID:  DACY ENRICO, DOB [birth year], MRN [id number]  PCP:  Kaleen Mask, MD  Cardiologist: Cassell Clement MD  No chief complaint on file.     History of Present Illness: Tina [name] is a 74 y.o. female who presents for a six-month follow-up visit  This pleasant 74 year old woman is seen for a scheduled followup office visit. She has a past history of hypertension, diabetes, hypercholesterolemia, and hypothyroidism. She also has a known murmur of aortic stenosis. She was hospitalized briefly at [facility] on 07/01/12 with hypertensive urgency. On that brief admission she had a CT angiogram of the chest which was normal. She also had an echocardiogram in February 2014 showing ejection fraction of 60% and grade 1 diastolic dysfunction. The aortic valve was not well seen but there was no significant gradient or significant aortic stenosis noted. She was seen as a work in with increasing chest discomfort by Tina [name] on 11/18/13. She had a Myoview stress test on 11/19/13 which was normal. It showed an ejection fraction of 77% and no wall motion abnormalities or ischemia. The patient continues to be under a lot of stress at home.  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 Two-dimensional echocardiogram on 12/16/13 showed vigorous left ventricular systolic function with ejection fraction 65-70% and dynamic outflow obstruction. She was started on a beta blocker.  However she went on the Internet and read something on Google about beta blockers and so she stopped her metoprolol. Carotid duplex ultrasound showed bilateral 40-59% stenosis. Continue current medical therapy. The patient has not been experiencing any chest pain. She has felt very stressed. Her blood pressure today is elevated because of the stress of her husband's illness. Also, she did not take any of her blood pressure medicines yet today.  We  checked her blood pressure later during the exam and it was improved after she had calmed down from rushing to get to the office on time.  Past Medical History  Diagnosis Date  . Diabetes mellitus   . Hypertension   . Hypothyroidism   . Hyperlipidemia   . Weakness   . Dizziness   . Heart murmur   . Dysrhythmia     " patient states she missed beats "  . Sinus infection     frequent  . WUJWJXBJ(478.2)     Past Surgical History  Procedure Laterality Date  . Colonoscopy    . Goiter removal  1972  . Cardiovascular stress test  03/06/2007    EF 86%  . US echocardiography  10/10/2006    EF 55-60%  . Brain surgery    . Abdominal hysterectomy    . Breast surgery      tumors right breast   . Sinus exploration       Current Outpatient Prescriptions  Medication Sig Dispense Refill  . aspirin 81 MG tablet Take 81 mg by mouth daily.      . Cinnamon 500 MG capsule Take 2,000 mg by mouth as needed (supplement).     . fish oil-omega-3 fatty acids 1000 MG capsule Take 2 g by mouth daily. Taking occ.    Marland Kitchen glimepiride (AMARYL) 4 MG tablet TAKE 1/2 TABLET BY MOUTH DAILY OR AS DIRECTED 90 tablet 3  . losartan-hydrochlorothiazide (HYZAAR) 100-25 MG per tablet TAKE 1 TABLET BY MOUTH DAILY. 90 tablet 0  . Multiple Vitamin (MULTIVITAMIN WITH MINERALS) TABS Take 1  tablet by mouth daily.    . potassium gluconate 595 MG TABS Take 595 mg by mouth daily.    Marland Kitchen. SYNTHROID 100 MCG tablet TAKE 1 TABLET (100 MCG TOTAL) BY MOUTH DAILY. 90 tablet 1   No current facility-administered medications for this visit.    Allergies:   Lipitor; Loratadine; Metformin and related; Pravachol; and Zocor    Social History:  The patient  reports that she has never smoked. She has never used smokeless tobacco. She reports that she does not drink alcohol or use illicit drugs.   Family History:  The patient's family history includes Heart disease in her mother.    ROS:  Please see the history of present illness.    Otherwise, review of systems are positive for none.   All other systems are reviewed and negative.    PHYSICAL EXAM: VS:  BP 140/80 mmHg  Pulse 86  Ht 5\' 2"  (1.575 m)  Wt 158 lb 12.8 oz (72.031 kg)  BMI 29.04 kg/m2 , BMI Body mass index is 29.04 kg/(m^2). GEN: Well nourished, well developed, in no acute distress HEENT: normal Neck: no JVD, carotid bruits, or masses Cardiac: RRR; there is a soft systolic ejection murmur at the base.  No rubs, or gallops,no edema  Respiratory:  clear to auscultation bilaterally, normal work of breathing GI: soft, nontender, nondistended, + BS MS: no deformity or atrophy Skin: warm and dry, no rash Neuro:  Strength and sensation are intact Psych: euthymic mood, full affect   EKG:  EKG is not ordered today.    Recent Labs: 07/01/2014: Hemoglobin 13.0; Platelets 314.0; TSH 1.20 03/04/2015: ALT 13; BUN 13; Creat 0.70; Potassium 4.2; Sodium 137    Lipid Panel    Component Value Date/Time   CHOL 186 03/04/2015 0806   TRIG 92 03/04/2015 0806   HDL 58 03/04/2015 0806   CHOLHDL 3.2 03/04/2015 0806   VLDL 18 03/04/2015 0806   LDLCALC 110 03/04/2015 0806   LDLDIRECT 120.9 05/01/2013 0929      Wt Readings from Last 3 Encounters:  03/11/15 158 lb 12.8 oz (72.031 kg)  07/06/14 158 lb 12.8 oz (72.031 kg)  12/16/13 163 lb (73.936 kg)         ASSESSMENT AND PLAN:  1. atypical chest pain. Recent normal Myoview stress test 11/19/13 showing ejection fraction 77% and no ischemia. 2. history of diastolic dysfunction by prior echocardiogram 3. aortic valve disease with murmur of aortic stenosis. 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 4. Right carotid bruit, asymptomatic. Carotid duplex shows no severe stenosis 5. Dyslipidemia 6. Diabetes mellitus 7. hypertensive heart disease without heart failure   Current medicines are reviewed at length with the patient today.  The patient does  not have concerns regarding medicines.  The following changes have been made:  no change  Labs/ tests ordered today include:  No orders of the defined types were placed in this encounter.     Disposition:   Continue same medication.  Okay not to be back on metoprolol at this point.  She is not having any problem with tachycardia or chest pain.  Recheck in 6 months for office visit.  At next office visit get lipid panel hepatic function panel is a metabolic panel and A1c  Signed, Cassell Clementhomas Rahi Chandonnet MD 03/11/2015 1:31 PM    [name] [address], [place], [place]  [id number] Phone: [phone]) [phone]; Fax: [phone]) [phone]

## 2015-03-11 NOTE — Patient Instructions (Signed)
Medication Instructions:  Your physician recommends that you continue on your current medications as directed. Please refer to the Current Medication list given to you today.  Labwork: none  Testing/Procedures: none  Follow-Up: Your physician recommends that you schedule a follow-up appointment in: 6 months with fasting labs (lp/bmet/hfp/a1c) with Dawayne PatriciaLori G NP or Bing NeighborsScott W PA

## 2015-05-11 ENCOUNTER — Other Ambulatory Visit: Payer: Self-pay

## 2015-05-11 DIAGNOSIS — E119 Type 2 diabetes mellitus without complications: Secondary | ICD-10-CM

## 2015-05-11 MED ORDER — GLIMEPIRIDE 4 MG PO TABS: ORAL_TABLET | ORAL | Status: DC

## 2015-06-09 ENCOUNTER — Other Ambulatory Visit: Payer: Self-pay | Admitting: Cardiology

## 2015-07-11 ENCOUNTER — Other Ambulatory Visit: Payer: Self-pay

## 2015-07-11 DIAGNOSIS — Z1231 Encounter for screening mammogram for malignant neoplasm of breast: Secondary | ICD-10-CM

## 2015-07-13 ENCOUNTER — Ambulatory Visit
Admission: RE | Admit: 2015-07-13 | Discharge: 2015-07-13 | Disposition: A | Discharge status: Home / Self Care | Payer: Medicare Other | Source: Ambulatory Visit

## 2015-07-13 DIAGNOSIS — Z1231 Encounter for screening mammogram for malignant neoplasm of breast: Secondary | ICD-10-CM

## 2015-08-26 ENCOUNTER — Other Ambulatory Visit: Payer: Self-pay | Admitting: Cardiology

## 2015-08-26 NOTE — Telephone Encounter (Signed)
Please advise on refill as patient has not had a recent tsh. Thanks, MI

## 2015-09-02 ENCOUNTER — Other Ambulatory Visit (INDEPENDENT_AMBULATORY_CARE_PROVIDER_SITE_OTHER): Payer: Medicare Other | Admitting: *Deleted

## 2015-09-02 DIAGNOSIS — I119 Hypertensive heart disease without heart failure: Secondary | ICD-10-CM

## 2015-09-02 DIAGNOSIS — I160 Hypertensive urgency: Secondary | ICD-10-CM

## 2015-09-02 LAB — BASIC METABOLIC PANEL
BUN: 10 mg/dL (ref 7–25)
CO2: 27 mmol/L (ref 20–31)
Calcium: 8.8 mg/dL (ref 8.6–10.4)
Chloride: 103 mmol/L (ref 98–110)
Creat: 0.78 mg/dL (ref 0.60–0.93)
Glucose, Bld: 182 mg/dL — ABNORMAL HIGH (ref 65–99)
POTASSIUM: 4.2 mmol/L (ref 3.5–5.3)
SODIUM: 138 mmol/L (ref 135–146)

## 2015-09-02 LAB — LIPID PANEL
CHOL/HDL RATIO: 2.4 ratio (ref <=5.0)
CHOLESTEROL: 200 mg/dL (ref 125–200)
HDL: 83 mg/dL (ref >=46)
LDL Cholesterol: 99 mg/dL (ref <130)
TRIGLYCERIDES: 89 mg/dL (ref <150)
VLDL: 18 mg/dL (ref <30)

## 2015-09-02 LAB — HEPATIC FUNCTION PANEL
ALT: 14 U/L (ref 6–29)
AST: 12 U/L (ref 10–35)
Albumin: 3.8 g/dL (ref 3.6–5.1)
Alkaline Phosphatase: 60 U/L (ref 33–130)
Bilirubin, Direct: 0.1 mg/dL (ref <=0.2)
Indirect Bilirubin: 0.4 mg/dL (ref 0.2–1.2)
TOTAL PROTEIN: 6.3 g/dL (ref 6.1–8.1)
Total Bilirubin: 0.5 mg/dL (ref 0.2–1.2)

## 2015-09-02 NOTE — Addendum Note (Signed)
Addended by: Tonita PhoenixBOWDEN, Lyonel Morejon K on: 09/02/2015 07:45 AM   Modules accepted: Orders

## 2015-09-02 NOTE — Addendum Note (Signed)
Addended by: Tonita PhoenixBOWDEN, ROBIN K on: 09/02/2015 07:44 AM   Modules accepted: Orders

## 2015-09-05 ENCOUNTER — Telehealth: Payer: Self-pay | Admitting: *Deleted

## 2015-09-05 NOTE — Telephone Encounter (Signed)
Pt notified of lab results by phone. Previous Dr. Patty SermonsBrackbill pt.

## 2015-09-09 ENCOUNTER — Ambulatory Visit: Payer: Private Health Insurance - Indemnity | Admitting: Physician Assistant

## 2015-09-09 ENCOUNTER — Ambulatory Visit: Payer: Private Health Insurance - Indemnity | Admitting: Nurse Practitioner

## 2015-09-11 NOTE — Progress Notes (Signed)
Cardiology Office Note:    Date:  09/12/2015   ID:  Tina [name], DOB 12/14/1940, MRN [id number]  PCP:  Kaleen MaskELKINS,WILSON OLIVER, MD  Cardiologist: Dr. Cassell Clementhomas Brackbill >> Dr. Tobias AlexanderKatarina Nelson / Tereso NewcomerScott Weaver, PA-C  Electrophysiologist:  n/a  Referring MD: Kaleen MaskElkins, Wilson Oliver, *   Chief Complaint  Patient presents with  . Cardiomyopathy    follow up    History of Present Illness:     Tina [name] is a 75 y.o. female with a hx of Hypertrophic obstructive cardiomyopathy, carotid stenosis, HTN, HL, diabetes, hypothyroidism. Echo in 7/15 with EF 65-70%, LVOT gradient 20 mmHg and mild SAM.  Last seen by Dr. Patty SermonsBrackbill 10/16.    Returns for FU.  Her husband has had a CVA and she is the primary caregiver.  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  She remains very active. Denies chest pain, syncope, dyspnea, orthopnea, PND, edema.  She is under a lot of stress.    Past Medical History  Diagnosis Date  . Diabetes mellitus   . Hypertension   . Hypothyroidism   . Hyperlipidemia   . Heart murmur   . Carotid stenosis     a. Carotid US 7/15 - bilat ICA 40-59% >> FU 1 year   . HOCM (hypertrophic obstructive cardiomyopathy) (HCC)     a. Echo 7/15 - Mild concentric LVH, vigorous LVF, EF 65-70%, LVOT dynamic obstruction at rest with peak gradient 20 mmHg, grade 1 diastolic dysfunction, mild SAM, mild to moderate MR  . Mitral regurgitation   . History of nuclear stress test     a. Myoview 6/15 - Normal stress nuclear study. LV Ejection Fraction: 77%    Past Surgical History  Procedure Laterality Date  . Colonoscopy    . Goiter removal  1972  . Cardiovascular stress test  03/06/2007    EF 86%  . [place] echocardiography  10/10/2006    EF 55-60%  . Brain surgery    . Abdominal hysterectomy    . Breast surgery      tumors right breast   . Sinus exploration      Current Medications: Outpatient Prescriptions Prior to Visit    Medication Sig Dispense Refill  . aspirin 81 MG tablet Take 81 mg by mouth daily.      . Cinnamon 500 MG capsule Take 2,000 mg by mouth as needed (supplement).     . fish oil-omega-3 fatty acids 1000 MG capsule Take 2 g by mouth daily. Taking occ.    Marland Kitchen. glimepiride (AMARYL) 4 MG tablet TAKE 1/2 TABLET BY MOUTH DAILY OR AS DIRECTED 90 tablet 3  . losartan-hydrochlorothiazide (HYZAAR) 100-25 MG tablet TAKE 1 TABLET BY MOUTH ONCE DAILY 90 tablet 2  . Multiple Vitamin (MULTIVITAMIN WITH MINERALS) TABS Take 1 tablet by mouth daily.    . potassium gluconate 595 MG TABS Take 595 mg by mouth daily.    Marland Kitchen. SYNTHROID 100 MCG tablet TAKE 1 TABLET BY MOUTH ONCE DAILY 90 tablet 1   No facility-administered medications prior to visit.     Allergies:   Lipitor; Loratadine; Metformin and related; Pravachol; and Zocor   Social History   Social History  . Marital Status: Married    Spouse Name: N/A  . Number of Children: N/A  . Years of Education: N/A   Social History Main Topics  . Smoking status: Never Smoker   .  Smokeless tobacco: Never Used  . Alcohol Use: No  . Drug Use: No  . Sexual Activity: Not Currently   Other Topics Concern  . None   Social History Narrative     Family History:  The patient's family history includes Heart disease in her mother.   ROS:   Please see the history of present illness.    Review of Systems  Eyes: Positive for visual disturbance.   All other systems reviewed and are negative.   Physical Exam:    VS:  BP 180/102 mmHg  Pulse 81  Ht  (1.575 m)  Wt 165 lb (74.844 kg)  BMI 30.17 kg/m2   GEN: Well nourished, well developed, in no acute distress HEENT: normal Neck: no JVD, no masses Cardiac: Normal S1/S2,  RRR; 2/6 systolic murmur LSB, rubs, or gallops, no edema    Respiratory:  clear to auscultation bilaterally; no wheezing, rhonchi or rales GI: soft, nontender, nondistended MS: no deformity or atrophy Skin: warm and dry Neuro: No focal  deficits  Psych: Alert and oriented x 3, normal affect  Wt Readings from Last 3 Encounters:  09/12/15 165 lb (74.844 kg)  03/11/15 158 lb 12.8 oz (72.031 kg)  07/06/14 158 lb 12.8 oz (72.031 kg)      Studies/Labs Reviewed:     EKG:  EKG is   ordered today.  The ekg ordered today demonstrates NSR, HR 81, normal axis, QTc 436 ms, no changes  Recent Labs: 09/02/2015: ALT 14; BUN 10; Creat 0.78; Potassium 4.2; Sodium 138   Recent Lipid Panel    Component Value Date/Time   CHOL 200 09/02/2015 0745   TRIG 89 09/02/2015 0745   HDL 83 09/02/2015 0745   CHOLHDL 2.4 09/02/2015 0745   VLDL 18 09/02/2015 0745   LDLCALC 99 09/02/2015 0745   LDLDIRECT 120.9 05/01/2013 0929    Additional studies/ records that were reviewed today include:   Carotid US 7/15 Bilat ICA 40-59% >> FU 1 year  Echo 7/15 Mild concentric LVH, vigorous LVF, EF 65-70%, LVOT dynamic obstruction at rest with peak gradient 20 mmHg, grade 1 diastolic dysfunction, mild SAM, mild to moderate MR  Myoview 6/15 Normal stress nuclear study. LV Ejection Fraction: 77%.   ASSESSMENT:     1. HOCM (hypertrophic obstructive cardiomyopathy) (HCC)   2. Carotid stenosis, bilateral   3. Mitral regurgitation   4. Hypertensive heart disease without heart failure   5. Pure hypercholesterolemia   6. Hypothyroidism, unspecified hypothyroidism type     PLAN:     In order of problems listed above:  1. HOCM - She has a systolic murmur on exam.  She was intol of beta-blocker therapy.  She had significant fatigue on low dose Metoprolol Succinate.  It has been 2 years since her last echo.  She does not appear to be symptomatic.  Will get FU echo.  I will have her FU with Dr. Tobias Alexander and me in the future.    2. Carotid stenosis - Past due for repeat Carotid US.  Will arrange repeat.     3. Mitral Regurgitation - Will arrange Echo as noted.  She is not having any symptoms to suggest worsening MR.  4. HTN - BP markedly  elevated. However, she had a very stressful morning.  BP at home is usually optimal.  I will have her monitor her BP at home and update me with her readings.  She has a lot of drug intolerances.  Get BMET at next  visit.   5. HL - She could not tolerate Atorvastatin or Simvastatin.  She does not want to try another statin.  She would benefit from aggressive cholesterol management given her carotid stenosis.  Recent HDL was > 80.  Obtain FU Carotid US as noted.  If stenosis is progressive, will need to consider alternative to cholesterol management or refer her to the Lipid Clinic.    6. Hypothyroidism - She is requesting her TSH be drawn here.  I have asked her to FU with her PCP but she notes that she has everything done here.  I strongly recommended she try to FU with her PCP for primary care issues.  Will go ahead and get a TSH at her next visit.      Medication Adjustments/Labs and Tests Ordered: Current medicines are reviewed at length with the patient today.  Concerns regarding medicines are outlined above.  Medication changes, Labs and Tests ordered today are outlined in the Patient Instructions noted below. Patient Instructions  Medication Instructions:  Your physician recommends that you continue on your current medications as directed. Please refer to the Current Medication list given to you today. Labwork: 6 MONTHS FOR BMET, TSH (THIS CAN BE DONE ABOUT 1 WEEK BEFORE DR. Delton See APPT IN 6 MONTHS) Testing/Procedures: 1. Your physician has requested that you have an echocardiogram THIS CAN BE DONE ABOUT 1 WEEK BEFORE DR. Delton See APPT IN 6 MONTHS. Echocardiography is a painless test that uses sound waves to create images of your heart. It provides your doctor with information about the size and shape of your heart and how well your heart's chambers and valves are working. This procedure takes approximately one hour. There are no restrictions for this procedure. 2. Your physician has requested  that you have a carotid duplex THIS CAN BE DONE ABOUT 1 WEEK BEFORE DR. Delton See APPT IN 6 MONTHS. This test is an ultrasound of the carotid arteries in your neck. It looks at blood flow through these arteries that supply the brain with blood. Allow one hour for this exam. There are no restrictions or special instructions. Follow-Up: Your physician wants you to follow-up in: 6 MONTHS WITH DR. Johnell Comings will receive a reminder letter in the mail two months in advance. If you don't receive a letter, please call our office to schedule the follow-up appointment. Any Other Special Instructions Will Be Listed Below (If Applicable). CHECK BP READING DAILY AND SEND READINGS IN AFTER ABOUT 2 WEEKS If you need a refill on your cardiac medications before your next appointment, please call your pharmacy.     Signed, Tereso Newcomer, PA-C  09/12/2015 1:22 PM    [facility] : [facility] [address] [place], [place], [place]  [address] Phone: [phone]; Fax: [phone]

## 2015-09-12 ENCOUNTER — Encounter: Payer: Self-pay | Admitting: Physician Assistant

## 2015-09-12 ENCOUNTER — Ambulatory Visit (INDEPENDENT_AMBULATORY_CARE_PROVIDER_SITE_OTHER): Payer: Medicare Other | Admitting: Physician Assistant

## 2015-09-12 VITALS — BP 180/102 | HR 81 | Ht 62.0 in | Wt 165.0 lb

## 2015-09-12 DIAGNOSIS — I6523 Occlusion and stenosis of bilateral carotid arteries: Secondary | ICD-10-CM

## 2015-09-12 DIAGNOSIS — E7800 Pure hypercholesterolemia, unspecified: Secondary | ICD-10-CM

## 2015-09-12 DIAGNOSIS — I340 Nonrheumatic mitral (valve) insufficiency: Secondary | ICD-10-CM

## 2015-09-12 DIAGNOSIS — I119 Hypertensive heart disease without heart failure: Secondary | ICD-10-CM

## 2015-09-12 DIAGNOSIS — I421 Obstructive hypertrophic cardiomyopathy: Secondary | ICD-10-CM | POA: Diagnosis not present

## 2015-09-12 DIAGNOSIS — E039 Hypothyroidism, unspecified: Secondary | ICD-10-CM

## 2015-09-12 NOTE — Patient Instructions (Addendum)
Medication Instructions:  Your physician recommends that you continue on your current medications as directed. Please refer to the Current Medication list given to you today. Labwork: 6 MONTHS FOR BMET, TSH (THIS CAN BE DONE ABOUT 1 WEEK BEFORE DR. Delton SeeNELSON APPT IN 6 MONTHS) Testing/Procedures: 1. Your physician has requested that you have an echocardiogram THIS CAN BE DONE ABOUT 1 WEEK BEFORE DR. Delton SeeNELSON APPT IN 6 MONTHS. Echocardiography is a painless test that uses sound waves to create images of your heart. It provides your doctor with information about the size and shape of your heart and how well your heart's chambers and valves are working. This procedure takes approximately one hour. There are no restrictions for this procedure. 2. Your physician has requested that you have a carotid duplex THIS CAN BE DONE ABOUT 1 WEEK BEFORE DR. Delton SeeNELSON APPT IN 6 MONTHS. This test is an ultrasound of the carotid arteries in your neck. It looks at blood flow through these arteries that supply the brain with blood. Allow one hour for this exam. There are no restrictions or special instructions. Follow-Up: Your physician wants you to follow-up in: 6 MONTHS WITH DR. Johnell ComingsNELSON You will receive a reminder letter in the mail two months in advance. If you don't receive a letter, please call our office to schedule the follow-up appointment. Any Other Special Instructions Will Be Listed Below (If Applicable). CHECK BP READING DAILY AND SEND READINGS IN AFTER ABOUT 2 WEEKS If you need a refill on your cardiac medications before your next appointment, please call your pharmacy.

## 2016-02-29 ENCOUNTER — Telehealth: Payer: Self-pay | Admitting: *Deleted

## 2016-02-29 ENCOUNTER — Encounter: Payer: Self-pay | Admitting: Physician Assistant

## 2016-02-29 ENCOUNTER — Ambulatory Visit (HOSPITAL_BASED_OUTPATIENT_CLINIC_OR_DEPARTMENT_OTHER): Payer: Medicare Other

## 2016-02-29 ENCOUNTER — Other Ambulatory Visit: Payer: Self-pay | Admitting: Physician Assistant

## 2016-02-29 ENCOUNTER — Other Ambulatory Visit: Payer: Self-pay

## 2016-02-29 ENCOUNTER — Ambulatory Visit (HOSPITAL_COMMUNITY)
Admission: RE | Admit: 2016-02-29 | Discharge: 2016-02-29 | Disposition: A | Discharge status: Home / Self Care | Payer: Medicare Other | Source: Ambulatory Visit | Attending: Cardiovascular Disease | Admitting: Cardiovascular Disease

## 2016-02-29 ENCOUNTER — Other Ambulatory Visit: Payer: Medicare Other | Admitting: *Deleted

## 2016-02-29 DIAGNOSIS — I6523 Occlusion and stenosis of bilateral carotid arteries: Secondary | ICD-10-CM

## 2016-02-29 DIAGNOSIS — I421 Obstructive hypertrophic cardiomyopathy: Secondary | ICD-10-CM | POA: Insufficient documentation

## 2016-02-29 DIAGNOSIS — I071 Rheumatic tricuspid insufficiency: Secondary | ICD-10-CM | POA: Insufficient documentation

## 2016-02-29 DIAGNOSIS — I340 Nonrheumatic mitral (valve) insufficiency: Secondary | ICD-10-CM | POA: Diagnosis not present

## 2016-02-29 DIAGNOSIS — E039 Hypothyroidism, unspecified: Secondary | ICD-10-CM

## 2016-02-29 DIAGNOSIS — I119 Hypertensive heart disease without heart failure: Secondary | ICD-10-CM

## 2016-02-29 LAB — BASIC METABOLIC PANEL
BUN: 15 mg/dL (ref 7–25)
CALCIUM: 9.6 mg/dL (ref 8.6–10.4)
CO2: 27 mmol/L (ref 20–31)
Chloride: 101 mmol/L (ref 98–110)
Creat: 0.79 mg/dL (ref 0.60–0.93)
Glucose, Bld: 157 mg/dL — ABNORMAL HIGH (ref 65–99)
Potassium: 4.3 mmol/L (ref 3.5–5.3)
SODIUM: 139 mmol/L (ref 135–146)

## 2016-02-29 LAB — TSH: TSH: 1.7 mIU/L

## 2016-02-29 NOTE — Telephone Encounter (Signed)
Pt notified of echo results and findings by phone with verbal understanding. 

## 2016-03-01 ENCOUNTER — Encounter: Payer: Self-pay | Admitting: Physician Assistant

## 2016-03-02 ENCOUNTER — Telehealth: Payer: Self-pay | Admitting: *Deleted

## 2016-03-02 NOTE — Telephone Encounter (Signed)
Pt notified of lab and carotid us results by phone with verbal understanding. I will fax copy of results to PCP. Pt said thank you.

## 2016-03-14 ENCOUNTER — Encounter (INDEPENDENT_AMBULATORY_CARE_PROVIDER_SITE_OTHER): Payer: Self-pay

## 2016-03-14 ENCOUNTER — Ambulatory Visit (INDEPENDENT_AMBULATORY_CARE_PROVIDER_SITE_OTHER): Payer: Medicare Other | Admitting: Cardiology

## 2016-03-14 ENCOUNTER — Encounter: Payer: Self-pay | Admitting: Cardiology

## 2016-03-14 VITALS — BP 152/82 | HR 79 | Ht 62.0 in | Wt 159.2 lb

## 2016-03-14 DIAGNOSIS — I340 Nonrheumatic mitral (valve) insufficiency: Secondary | ICD-10-CM | POA: Diagnosis not present

## 2016-03-14 DIAGNOSIS — E7800 Pure hypercholesterolemia, unspecified: Secondary | ICD-10-CM

## 2016-03-14 DIAGNOSIS — I6523 Occlusion and stenosis of bilateral carotid arteries: Secondary | ICD-10-CM

## 2016-03-14 DIAGNOSIS — I119 Hypertensive heart disease without heart failure: Secondary | ICD-10-CM | POA: Diagnosis not present

## 2016-03-14 MED ORDER — METOPROLOL TARTRATE 25 MG PO TABS: 25.0000 mg | ORAL_TABLET | Freq: Two times a day (BID) | ORAL | 3 refills | Status: DC

## 2016-03-14 MED ORDER — CARVEDILOL 3.125 MG PO TABS: 3.1250 mg | ORAL_TABLET | Freq: Two times a day (BID) | ORAL | 3 refills | Status: DC

## 2016-03-14 NOTE — Patient Instructions (Addendum)
Medication Instructions:   START TAKING CARVEDILOL 3.125 MG BY MOUTH TWICE DAILY    Follow-Up:  Your physician wants you to follow-up in: 6 MONTHS WITH DR Johnell ComingsNELSON You will receive a reminder letter in the mail two months in advance. If you don't receive a letter, please call our office to schedule the follow-up appointment.      If you need a refill on your cardiac medications before your next appointment, please call your pharmacy.

## 2016-03-14 NOTE — Progress Notes (Signed)
Cardiology Office Note:    Date:  03/14/2016   ID:  Tina [name], DOB 08-15-1940, MRN [id number]  PCP:  Kaleen Mask, MD  Cardiologist: Dr. Cassell Clement >> Tobias Alexander, MD  Electrophysiologist:  n/a  Referring MD: Kaleen Mask, *   Chief complain: 6 months follow-up  History of Present Illness:     Tina [name] is a 75 y.o. female with a hx of Hypertrophic obstructive cardiomyopathy, carotid stenosis, HTN, HL, diabetes, hypothyroidism. Echo in 7/15 with EF 65-70%, LVOT gradient 20 mmHg and mild SAM.  Last seen by Dr. Patty Sermons 10/16.    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 She denies any chest pain or shortness of breath, no lower extremity edema palpitations or syncope. The only problem she has noticed is elevated blood pressure in 150s to 160s especially when she is stressed out. No syncope.  Past Medical History:  Diagnosis Date  . Carotid artery disease (HCC)    a. Carotid US 7/15 - bilat ICA 40-59% >> FU 1 year //  b. Carotid US 10/17: bilat ICA 1-39% >> FU PRN (will get repeat in 2 years)   . Diabetes mellitus   . Heart murmur   . History of nuclear stress test    a. Myoview 6/15 - Normal stress nuclear study. LV Ejection Fraction: 77%  . HOCM (hypertrophic obstructive cardiomyopathy) (HCC)    a. Echo 7/15 - Mild concentric LVH, vigorous LVF, EF 65-70%, LVOT dynamic obstruction at rest with peak gradient 20 mmHg, grade 1 diastolic dysfunction, mild SAM, mild to moderate MR // b. Echo 10/17: EF 55-60%, normal wall motion, grade 2 diastolic dysfunction, MAC, mild to moderate MR, mild LAE (LVOT 9 mmHg)  . Hyperlipidemia   . Hypertension   . Hypothyroidism   . Mitral regurgitation     Past Surgical History:  Procedure Laterality Date  . ABDOMINAL HYSTERECTOMY    . BRAIN SURGERY    .  BREAST SURGERY     tumors right breast   . CARDIOVASCULAR STRESS TEST  03/06/2007   EF 86%  . COLONOSCOPY    . GOITER REMOVAL  1972  . SINUS EXPLORATION    . US ECHOCARDIOGRAPHY  10/10/2006   EF 55-60%    Current Medications: Outpatient Medications Prior to Visit  Medication Sig Dispense Refill  . aspirin 81 MG tablet Take 81 mg by mouth daily.      . Cinnamon 500 MG capsule Take 2,000 mg by mouth as needed (supplement).     . fish oil-omega-3 fatty acids 1000 MG capsule Take 2 g by mouth daily. Taking occ.    Marland Kitchen glimepiride (AMARYL) 4 MG tablet TAKE 1/2 TABLET BY MOUTH DAILY OR AS DIRECTED 90 tablet 3  . losartan-hydrochlorothiazide (HYZAAR) 100-25 MG tablet TAKE 1 TABLET BY MOUTH ONCE DAILY 90 tablet 2  . Multiple Vitamin (MULTIVITAMIN WITH MINERALS) TABS Take 1 tablet by mouth daily.    . potassium gluconate 595 MG TABS Take 595 mg by mouth daily.    Marland Kitchen SYNTHROID 100 MCG tablet TAKE 1 TABLET BY MOUTH ONCE DAILY 90 tablet 1   No facility-administered medications prior to visit.      Allergies:   Lipitor [atorvastatin calcium]; Loratadine; Metformin and related; Pravachol; and Zocor [simvastatin]   Social History   Social History  .  Marital status: Married    Spouse name: N/A  . Number of children: N/A  . Years of education: N/A   Social History Main Topics  . Smoking status: Never Smoker  . Smokeless tobacco: Never Used  . Alcohol use No  . Drug use: No  . Sexual activity: Not Currently   Other Topics Concern  . None   Social History Narrative  . None     Family History:  The patient's family history includes Heart disease in her mother.   ROS:   Please see the history of present illness.    Review of Systems  Eyes: Positive for visual disturbance.   All other systems reviewed and are negative.   Physical Exam:    VS:  BP (!) 152/82   Pulse 79   Ht 5\' 2"  (1.575 m)   Wt 159 lb 3.2 oz (72.2 kg)   SpO2 97%   BMI 29.12 kg/m    GEN: Well nourished, well  developed, in no acute distress HEENT: normal Neck: no JVD, no masses Cardiac: Normal S1/S2,  RRR; 2/6 systolic murmur LSB, rubs, or gallops, no edema    Respiratory:  clear to auscultation bilaterally; no wheezing, rhonchi or rales GI: soft, nontender, nondistended MS: no deformity or atrophy Skin: warm and dry Neuro: No focal deficits  Psych: Alert and oriented x 3, normal affect  Wt Readings from Last 3 Encounters:  03/14/16 159 lb 3.2 oz (72.2 kg)  09/12/15 165 lb (74.8 kg)  03/11/15 158 lb 12.8 oz (72 kg)      Studies/Labs Reviewed:     EKG:  EKG is   ordered today.  The ekg ordered today demonstrates NSR, 60 bpm, no change from prior.   Recent Labs: 09/02/2015: ALT 14 02/29/2016: BUN 15; Creat 0.79; Potassium 4.3; Sodium 139; TSH 1.70   Recent Lipid Panel    Component Value Date/Time   CHOL 200 09/02/2015 0745   TRIG 89 09/02/2015 0745   HDL 83 09/02/2015 0745   CHOLHDL 2.4 09/02/2015 0745   VLDL 18 09/02/2015 0745   LDLCALC 99 09/02/2015 0745   LDLDIRECT 120.9 05/01/2013 0929    Additional studies/ records that were reviewed today include:   Carotid US 7/15 Bilat ICA 1-39%>> FU as needed  TTE: 02/29/2016  Left ventricle:  The cavity size was normal. Wall thickness was normal. Systolic function was normal. The estimated ejection fraction was in the range of 55% to 60%. Wall motion was normal; there were no regional wall motion abnormalities. Features are consistent with a pseudonormal left ventricular filling pattern, with concomitant abnormal relaxation and increased filling pressure (grade 2 diastolic dysfunction). ------------------------------------------------------------------- Aortic valve:   there was no stenosis. There was no regurgitation. ------------------------------------------------------------------- Mitral valve:   Calcified annulus.  Doppler:  There was mild to moderate regurgitation.      ------------------------------------------------------------------- Left atrium:  The atrium was mildly dilated. ------------------------------------------------------------------- Right ventricle:  The cavity size was normal. Systolic function was normal. ------------------------------------------------------------------- Tricuspid valve:   There was mild regurgitation.  Myoview 6/15 Normal stress nuclear study. LV Ejection Fraction: 77%.   ASSESSMENT:     No diagnosis found.  PLAN:     In order of problems listed above:  1. Hypertensive heart disease without CHF, in the past she was labeled as hypertrophic cardiomyopathy however she has moderate basal septal hypertrophy but otherwise normal wall thickness. LVEF was normal. Her blood pressure is uncontrolled she didn't tolerate metoprolol in the past,  we'll try carvedilol  3.125 mg by mouth twice a day alternatively we will try amlodipine 2.5 mg daily.  2. Carotid stenosis - minimal stenosis bilaterally most recent carotid ultrasound follow-up as needed.    3. Mitral Regurgitation - mild to moderate on the most recent echo in October 2017.   4. HL - She could not tolerate Atorvastatin or Simvastatin.  All lipids at goal in April 2017 on diet and fish oil.    6. Hypothyroidism - most recent TSH 1.7 on Synthroid.     Medication Adjustments/Labs and Tests Ordered: Current medicines are reviewed at length with the patient today.  Concerns regarding medicines are outlined above.  Medication changes, Labs and Tests ordered today are outlined in the Patient Instructions noted below. There are no Patient Instructions on file for this visit. Signed, Tobias AlexanderKatarina Gared Gillie, MD  03/14/2016 10:48 AM    [facility] [address], [place], [place]  [address] Phone: [phone]; Fax: [phone]

## 2016-08-08 ENCOUNTER — Other Ambulatory Visit: Payer: Self-pay | Admitting: *Deleted

## 2016-08-08 MED ORDER — LOSARTAN POTASSIUM-HCTZ 100-25 MG PO TABS: 1.0000 | ORAL_TABLET | Freq: Every day | ORAL | 1 refills | Status: DC

## 2016-08-09 ENCOUNTER — Other Ambulatory Visit: Payer: Self-pay

## 2016-08-09 DIAGNOSIS — E119 Type 2 diabetes mellitus without complications: Secondary | ICD-10-CM

## 2016-08-09 NOTE — Telephone Encounter (Signed)
Yes this is correct.  Pts new PCP is Dr Windle GuardWilson Elkins.  Dr Jeannetta NapElkins will follow her synthroid and diabetic meds.  Thanks for handling this.

## 2016-08-10 ENCOUNTER — Other Ambulatory Visit: Payer: Self-pay | Admitting: *Deleted

## 2016-08-10 DIAGNOSIS — E119 Type 2 diabetes mellitus without complications: Secondary | ICD-10-CM

## 2016-08-10 MED ORDER — GLIMEPIRIDE 4 MG PO TABS: ORAL_TABLET | ORAL | 3 refills | Status: DC

## 2016-08-10 MED ORDER — SYNTHROID 100 MCG PO TABS: 100.0000 ug | ORAL_TABLET | Freq: Every day | ORAL | 1 refills | Status: AC

## 2016-08-10 NOTE — Telephone Encounter (Signed)
Patient called and wanted to know why her refills for glimepiride and synthroid have not been authorized. I made her aware that Dr Delton SeeNelson does not normally refill these as they are non cardiac meds. She stated that she was not informed of this at her visit and that Dr Patty SermonsBrackbill always refilled these for her. Per note from yesterday  Conversation  (Newest Message First)  Loa SocksIvy M Martin, LPN  to [facility] Cox, CMA   08/09/16 4:13 PM  Correctly sent  Loa Socksvy M Martin, LPN   1/61/093/15/18 6:044:12 PM  Note    Yes this is correct.  Pts new PCP is Dr Windle GuardWilson Elkins.  Dr Jeannetta NapElkins will follow her synthroid and diabetic meds.  Thanks for handling this.     Candice A Cox, CMA  to Loa Socksvy M Martin, LPN   5/40/983/15/18 1:193:27 PM  Got refill requests for glimepiride and synthroid, I refused them and put note to send to PCP. I just wanted to make you aware in case Dr. Delton SeeNelson was ok refilling and I needed to send them into pharmacy.   Patient states that she has not seen Dr Jeannetta NapElkins and she is not going to see him unless she gets hurt or something as she has always primarily seen her cardiologist. She says that she may just need to find a new physician. She also was upset that no one had called her to schedule her six month follow up appointment. She stated that she had called to schedule and the scheduler informed her that they would call her back at a later time to schedule. I transferred her to scheduling and let her know that I would send a message in regards to refilling these medications. Please advise. Thanks, MI

## 2016-09-03 ENCOUNTER — Encounter (INDEPENDENT_AMBULATORY_CARE_PROVIDER_SITE_OTHER): Payer: Self-pay

## 2016-09-03 ENCOUNTER — Encounter: Payer: Self-pay | Admitting: Cardiology

## 2016-09-03 ENCOUNTER — Ambulatory Visit (INDEPENDENT_AMBULATORY_CARE_PROVIDER_SITE_OTHER): Payer: Medicare Other | Admitting: Cardiology

## 2016-09-03 VITALS — BP 130/66 | HR 75 | Ht 62.0 in | Wt 161.0 lb

## 2016-09-03 DIAGNOSIS — I10 Essential (primary) hypertension: Secondary | ICD-10-CM

## 2016-09-03 DIAGNOSIS — I119 Hypertensive heart disease without heart failure: Secondary | ICD-10-CM | POA: Diagnosis not present

## 2016-09-03 DIAGNOSIS — E782 Mixed hyperlipidemia: Secondary | ICD-10-CM

## 2016-09-03 DIAGNOSIS — E7800 Pure hypercholesterolemia, unspecified: Secondary | ICD-10-CM

## 2016-09-03 DIAGNOSIS — I340 Nonrheumatic mitral (valve) insufficiency: Secondary | ICD-10-CM | POA: Diagnosis not present

## 2016-09-03 DIAGNOSIS — I6523 Occlusion and stenosis of bilateral carotid arteries: Secondary | ICD-10-CM | POA: Diagnosis not present

## 2016-09-03 DIAGNOSIS — E118 Type 2 diabetes mellitus with unspecified complications: Secondary | ICD-10-CM

## 2016-09-03 LAB — BASIC METABOLIC PANEL
BUN/Creatinine Ratio: 19 (ref 12–28)
BUN: 15 mg/dL (ref 8–27)
CO2: 25 mmol/L (ref 18–29)
Calcium: 9.5 mg/dL (ref 8.7–10.3)
Chloride: 97 mmol/L (ref 96–106)
Creatinine, Ser: 0.81 mg/dL (ref 0.57–1.00)
GFR calc Af Amer: 81 mL/min/{1.73_m2} (ref >59)
GFR calc non Af Amer: 70 mL/min/{1.73_m2} (ref >59)
Glucose: 163 mg/dL — ABNORMAL HIGH (ref 65–99)
Potassium: 4 mmol/L (ref 3.5–5.2)
Sodium: 139 mmol/L (ref 134–144)

## 2016-09-03 LAB — CBC
Hematocrit: 43.1 % (ref 34.0–46.6)
Hemoglobin: 13.7 g/dL (ref 11.1–15.9)
MCH: 25.4 pg — ABNORMAL LOW (ref 26.6–33.0)
MCHC: 31.8 g/dL (ref 31.5–35.7)
MCV: 80 fL (ref 79–97)
Platelets: 282 10*3/uL (ref 150–379)
RBC: 5.4 x10E6/uL — ABNORMAL HIGH (ref 3.77–5.28)
RDW: 15.8 % — ABNORMAL HIGH (ref 12.3–15.4)
WBC: 4.5 10*3/uL (ref 3.4–10.8)

## 2016-09-03 LAB — LIPID PANEL
Chol/HDL Ratio: 2.7 ratio (ref 0.0–4.4)
Cholesterol, Total: 237 mg/dL — ABNORMAL HIGH (ref 100–199)
HDL: 88 mg/dL (ref >39)
LDL Calculated: 129 mg/dL — ABNORMAL HIGH (ref 0–99)
Triglycerides: 102 mg/dL (ref 0–149)
VLDL Cholesterol Cal: 20 mg/dL (ref 5–40)

## 2016-09-03 LAB — HEPATIC FUNCTION PANEL
ALT: 15 IU/L (ref 0–32)
AST: 15 IU/L (ref 0–40)
Albumin: 4.4 g/dL (ref 3.5–4.8)
Alkaline Phosphatase: 84 IU/L (ref 39–117)
Bilirubin Total: 0.5 mg/dL (ref 0.0–1.2)
Bilirubin, Direct: 0.13 mg/dL (ref 0.00–0.40)
Total Protein: 7 g/dL (ref 6.0–8.5)

## 2016-09-03 LAB — TSH: TSH: 1.61 u[IU]/mL (ref 0.450–4.500)

## 2016-09-03 NOTE — Patient Instructions (Signed)
Medication Instructions:  None Ordered   Labwork: Your physician recommends that you return for lab work today for BMET, CBC, TSH, LFT's, LIPIDS  Testing/Procedures: None Ordered   Follow-Up: Your physician wants you to follow-up in: 6 months with Dr. Delton See. You will receive a reminder letter in the mail two months in advance. If you don't receive a letter, please call our office to schedule the follow-up appointment.   Any Other Special Instructions Will Be Listed Below (If Applicable).     If you need a refill on your cardiac medications before your next appointment, please call your pharmacy.

## 2016-09-03 NOTE — Addendum Note (Signed)
Addended by: Etheleen Mayhew C on: 09/03/2016 09:12 AM   Modules accepted: Orders

## 2016-09-03 NOTE — Addendum Note (Signed)
Addended by: Etheleen Mayhew C on: 09/03/2016 10:32 AM   Modules accepted: Orders

## 2016-09-03 NOTE — Progress Notes (Signed)
Cardiology Office Note:    Date:  09/03/2016   ID:  Elson Clan, DOB 07-04-1940, MRN [id number]  PCP:  Leonard Downing, MD  Cardiologist: Dr. Darlin Coco >> Ena Dawley, MD  Electrophysiologist:  n/a  Referring MD: Leonard Downing, *   Chief complain: 6 months follow-up  History of Present Illness:     Tina [name] is a 76 y.o. female with a hx of Hypertrophic obstructive cardiomyopathy, carotid stenosis, HTN, HL, diabetes, hypothyroidism. Echo in 7/15 with EF 65-70%, LVOT gradient 20 mmHg and mild SAM.  Last seen by Dr. Mare Ferrari 10/16.    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 She denies any chest pain or shortness of breath, no lower extremity edema palpitations or syncope. The only problem she has noticed is elevated blood pressure in 150s to 160s especially when she is stressed out. No syncope.  09/03/2016 - 6 months follow-up, in the last visit blood pressure was elevated and evidence of moderate LVH echocardiogram so we started carvedilol 3.125 by mouth twice a day however patient developed significant dizziness with recurrent falls and discontinue carvedilol. He otherwise denies chest pain shortness of breath no lower extremity edema, orthopnea, or paroxysmal nocturnal dyspnea. Her dizziness has resolved after she stopped carvedilol.  Past Medical History:  Diagnosis Date  . Carotid artery disease ([place])    a. Carotid US 7/15 - bilat ICA 40-59% >> FU 1 year //  b. Carotid US 10/17: bilat ICA 1-39% >> FU PRN (will get repeat in 2 years)   . Diabetes mellitus   . Heart murmur   . History of nuclear stress test    a. Myoview 6/15 - Normal stress nuclear study. LV Ejection Fraction: 77%  . HOCM (hypertrophic obstructive cardiomyopathy) ([place])    a. Echo 7/15 - Mild concentric LVH, vigorous LVF, EF 65-70%,  LVOT dynamic obstruction at rest with peak gradient 20 mmHg, grade 1 diastolic dysfunction, mild SAM, mild to moderate MR // b. Echo 10/17: EF 55-60%, normal wall motion, grade 2 diastolic dysfunction, MAC, mild to moderate MR, mild LAE (LVOT 9 mmHg)  . Hyperlipidemia   . Hypertension   . Hypothyroidism   . Mitral regurgitation     Past Surgical History:  Procedure Laterality Date  . ABDOMINAL HYSTERECTOMY    . BRAIN SURGERY    . BREAST SURGERY     tumors right breast   . CARDIOVASCULAR STRESS TEST  03/06/2007   EF 86%  . COLONOSCOPY    . GOITER REMOVAL  1972  . SINUS EXPLORATION    . US ECHOCARDIOGRAPHY  10/10/2006   EF 55-60%    Current Medications: Outpatient Medications Prior to Visit  Medication Sig Dispense Refill  . aspirin 81 MG tablet Take 81 mg by mouth daily.      . Cinnamon 500 MG capsule Take 2,000 mg by mouth as needed (supplement).     . fish oil-omega-3 fatty acids 1000 MG capsule Take 2 g by mouth daily. Taking occ.    Marland Kitchen glimepiride (AMARYL) 4 MG tablet TAKE 1/2 TABLET BY MOUTH DAILY OR AS DIRECTED 90 tablet 3  . losartan-hydrochlorothiazide (HYZAAR) 100-25 MG tablet Take 1 tablet by mouth daily. 90 tablet 1  . Multiple Vitamin (MULTIVITAMIN WITH MINERALS) TABS Take 1 tablet by mouth daily.    Marland Kitchen  potassium gluconate 595 MG TABS Take 595 mg by mouth daily.    Marland Kitchen SYNTHROID 100 MCG tablet Take 1 tablet (100 mcg total) by mouth daily. 90 tablet 1  . carvedilol (COREG) 3.125 MG tablet Take 1 tablet (3.125 mg total) by mouth 2 (two) times daily. 180 tablet 3   No facility-administered medications prior to visit.      Allergies:   Carvedilol; Lipitor [atorvastatin calcium]; Loratadine; Metformin and related; Pravachol; Zocor [simvastatin]; and Metoprolol   Social History   Social History  . Marital status: Married    Spouse name: N/A  . Number of children: N/A  . Years of education: N/A   Social History Main Topics  . Smoking status: Never Smoker  . Smokeless  tobacco: Never Used  . Alcohol use No  . Drug use: No  . Sexual activity: Not Currently   Other Topics Concern  . None   Social History Narrative  . None     Family History:  The patient's family history includes Heart disease in her mother.   ROS:   Please see the history of present illness.    Review of Systems  Eyes: Positive for visual disturbance.   All other systems reviewed and are negative.   Physical Exam:    VS:  BP 130/66   Pulse 75   Ht 5' 2"  (1.575 m)   Wt 161 lb (73 kg)   BMI 29.45 kg/m    GEN: Well nourished, well developed, in no acute distress  HEENT: normal  Neck: no JVD, no masses Cardiac: Normal S1/S2,  RRR; 2/6 systolic murmur LSB, rubs, or gallops, no edema    Respiratory:  clear to auscultation bilaterally; no wheezing, rhonchi or rales GI: soft, nontender, nondistended MS: no deformity or atrophy  Skin: warm and dry Neuro: No focal deficits  Psych: Alert and oriented x 3, normal affect  Wt Readings from Last 3 Encounters:  09/03/16 161 lb (73 kg)  03/14/16 159 lb 3.2 oz (72.2 kg)  09/12/15 165 lb (74.8 kg)      Studies/Labs Reviewed:     EKG:  EKG is   ordered today.  The ekg ordered today demonstrates NSR, 60 bpm, no change from prior.   Recent Labs: 02/29/2016: BUN 15; Creat 0.79; Potassium 4.3; Sodium 139; TSH 1.70   Recent Lipid Panel    Component Value Date/Time   CHOL 200 09/02/2015 0745   TRIG 89 09/02/2015 0745   HDL 83 09/02/2015 0745   CHOLHDL 2.4 09/02/2015 0745   VLDL 18 09/02/2015 0745   LDLCALC 99 09/02/2015 0745   LDLDIRECT 120.9 05/01/2013 0929    Additional studies/ records that were reviewed today include:   Carotid US 7/15 Bilat ICA 1-39%>> FU as needed  TTE: 02/29/2016  Left ventricle:  The cavity size was normal. Wall thickness was normal. Systolic function was normal. The estimated ejection fraction was in the range of 55% to 60%. Wall motion was normal; there were no regional wall motion  abnormalities. Features are consistent with a pseudonormal left ventricular filling pattern, with concomitant abnormal relaxation and increased filling pressure (grade 2 diastolic dysfunction). ------------------------------------------------------------------- Aortic valve:   there was no stenosis. There was no regurgitation. ------------------------------------------------------------------- Mitral valve:   Calcified annulus.  Doppler:  There was mild to moderate regurgitation.     ------------------------------------------------------------------- Left atrium:  The atrium was mildly dilated. ------------------------------------------------------------------- Right ventricle:  The cavity size was normal. Systolic function was normal. ------------------------------------------------------------------- Tricuspid valve:   There  was mild regurgitation.  Myoview 6/15 Normal stress nuclear study. LV Ejection Fraction: 77%.  EKG: 09/03/2016 shows normal sinus rhythm, moderate voltage criteria for LVH unchanged from prior.   ASSESSMENT:     1. Essential hypertension   2. Mixed hyperlipidemia   3. Hypertensive heart disease without CHF   4. Non-rheumatic mitral regurgitation   5. Carotid stenosis, bilateral   6. Pure hypercholesterolemia   7. Type 2 diabetes mellitus with complication, unspecified long term insulin use status (HCC)     PLAN:     In order of problems listed above:  1. Hypertensive heart disease without CHF, in the past she was labeled as hypertrophic cardiomyopathy however she has moderate basal septal hypertrophy but otherwise normal wall thickness. LVEF was normal. Her blood pressure is uncontrolled she didn't tolerate metoprolol in the past,  She developed significant dizziness with carvedilol 3.125 mg by mouth twice a day, blood pressure is now controlled I would continue current regimen. It seems to be a fine line between hypertension and hypotension in her,  so I won't be adding anything else unless her blood pressure is significantly elevated.  2. Carotid stenosis - minimal stenosis bilaterally most recent carotid ultrasound follow-up as needed.    3. Mitral Regurgitation - mild to moderate on the most recent echo in October 2017.   4. HL - She could not tolerate Atorvastatin or Simvastatin.  All lipids at goal in April 2017 on diet and fish oil.  She eats a lot of bacon, educated about that. We will recheck lipids today.  6. Hypothyroidism - most recent TSH 1.7 on Synthroid.     Medication Adjustments/Labs and Tests Ordered: Current medicines are reviewed at length with the patient today.  Concerns regarding medicines are outlined above.  Medication changes, Labs and Tests ordered today are outlined in the Patient Instructions noted below. Patient Instructions  Medication Instructions:  None Ordered   Labwork: Your physician recommends that you return for lab work today for BMET, CBC, TSH, LFT's, LIPIDS  Testing/Procedures: None Ordered   Follow-Up: Your physician wants you to follow-up in: 6 months with Dr. [name]. You will receive a reminder letter in the mail two months in advance. If you don't receive a letter, please call our office to schedule the follow-up appointment.   Any Other Special Instructions Will Be Listed Below (If Applicable).     If you need a refill on your cardiac medications before your next appointment, please call your pharmacy.    Signed, Ena Dawley, MD  09/03/2016 9:10 AM    [facility] [place], [place], [place]  [address] Phone: [phone]; Fax: [phone]

## 2016-09-04 LAB — HGB A1C W/O EAG: Hgb A1c MFr Bld: 9 % — ABNORMAL HIGH (ref 4.8–5.6)

## 2016-09-05 ENCOUNTER — Encounter: Payer: Self-pay | Admitting: *Deleted

## 2016-09-14 ENCOUNTER — Telehealth: Payer: Self-pay | Admitting: Cardiology

## 2016-09-14 NOTE — Telephone Encounter (Signed)
New Message     Please mail her the last blood work results she had done , she was told on 4/10 that it would be mailed and she needs it to take to her endocrinologist

## 2016-09-14 NOTE — Telephone Encounter (Signed)
Informed the pt that this was mailed to her as requested, on 4/10.  Reiterated to the pt that as previously mentioned, our out of office mail is typically slower than most, due to the influx of mail going out of the office.  Advised the pt to call back Monday afternoon if she still hasn't received her labs in the mail, then we can arrange for her to pick a copy up at the front desk.  Pt verbalized understanding and agrees with this plan.

## 2017-04-29 ENCOUNTER — Other Ambulatory Visit: Payer: Self-pay | Admitting: Cardiology

## 2017-05-15 ENCOUNTER — Ambulatory Visit (INDEPENDENT_AMBULATORY_CARE_PROVIDER_SITE_OTHER): Payer: Medicare Other | Admitting: Cardiology

## 2017-05-15 ENCOUNTER — Encounter: Payer: Self-pay | Admitting: Cardiology

## 2017-05-15 VITALS — BP 146/76 | HR 77 | Resp 16 | Ht 62.0 in | Wt 151.6 lb

## 2017-05-15 DIAGNOSIS — E118 Type 2 diabetes mellitus with unspecified complications: Secondary | ICD-10-CM | POA: Diagnosis not present

## 2017-05-15 DIAGNOSIS — I6523 Occlusion and stenosis of bilateral carotid arteries: Secondary | ICD-10-CM

## 2017-05-15 DIAGNOSIS — I10 Essential (primary) hypertension: Secondary | ICD-10-CM | POA: Diagnosis not present

## 2017-05-15 DIAGNOSIS — I119 Hypertensive heart disease without heart failure: Secondary | ICD-10-CM | POA: Diagnosis not present

## 2017-05-15 DIAGNOSIS — E782 Mixed hyperlipidemia: Secondary | ICD-10-CM

## 2017-05-15 DIAGNOSIS — I340 Nonrheumatic mitral (valve) insufficiency: Secondary | ICD-10-CM

## 2017-05-15 NOTE — Progress Notes (Signed)
Cardiology Office Note:    Date:  05/15/2017   ID:  Tina [name], DOB July 28, 1940, MRN [id number]  PCP:  Leonard Downing, MD  Cardiologist: Dr. Darlin Coco >> Ena Dawley, MD  Electrophysiologist:  n/a  Referring MD: Leonard Downing, *   Chief complain: 6 months follow-up  History of Present Illness:     Tina [name] is a 76 y.o. female with a hx of Hypertrophic obstructive cardiomyopathy, carotid stenosis, HTN, HL, diabetes, hypothyroidism. Echo in 7/15 with EF 65-70%, LVOT gradient 20 mmHg and mild SAM.  Last seen by Dr. Mare Ferrari 10/16.    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 She denies any chest pain or shortness of breath, no lower extremity edema palpitations or syncope. The only problem she has noticed is elevated blood pressure in 150s to 160s especially when she is stressed out. No syncope.  09/03/2016 - 6 months follow-up, in the last visit blood pressure was elevated and evidence of moderate LVH echocardiogram so we started carvedilol 3.125 by mouth twice a day however patient developed significant dizziness with recurrent falls and discontinue carvedilol. He otherwise denies chest pain shortness of breath no lower extremity edema, orthopnea, or paroxysmal nocturnal dyspnea. Her dizziness has resolved after she stopped carvedilol.  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 No lower extremity edema or claudications. The patient states that she feels great she has absolutely no pains despite heavy work every day. She checks her blood pressure at home and it's usually around 120/70.  Past Medical  History:  Diagnosis Date  . Carotid artery disease ([place])    a. Carotid US 7/15 - bilat ICA 40-59% >> FU 1 year //  b. Carotid US 10/17: bilat ICA 1-39% >> FU PRN (will get repeat in 2 years)   . Diabetes mellitus   . Heart murmur   . History of nuclear stress test    a. Myoview 6/15 - Normal stress nuclear study. LV Ejection Fraction: 77%  . HOCM (hypertrophic obstructive cardiomyopathy) ([place])    a. Echo 7/15 - Mild concentric LVH, vigorous LVF, EF 65-70%, LVOT dynamic obstruction at rest with peak gradient 20 mmHg, grade 1 diastolic dysfunction, mild SAM, mild to moderate MR // b. Echo 10/17: EF 55-60%, normal wall motion, grade 2 diastolic dysfunction, MAC, mild to moderate MR, mild LAE (LVOT 9 mmHg)  . Hyperlipidemia   . Hypertension   . Hypothyroidism   . Mitral regurgitation     Past Surgical History:  Procedure Laterality Date  . ABDOMINAL HYSTERECTOMY    . BRAIN SURGERY    . BREAST SURGERY     tumors right breast   . CARDIOVASCULAR STRESS TEST  03/06/2007   EF 86%  . COLONOSCOPY    . GOITER REMOVAL  1972  . SINUS EXPLORATION    . US ECHOCARDIOGRAPHY  10/10/2006   EF 55-60%    Current Medications: Outpatient Medications Prior to Visit  Medication Sig Dispense Refill  . aspirin 81 MG tablet Take 81 mg by mouth daily.      . Cinnamon 500 MG capsule  Take 2,000 mg by mouth as needed (supplement).     . fish oil-omega-3 fatty acids 1000 MG capsule Take 2 g by mouth daily. Taking occ.    Marland Kitchen glimepiride (AMARYL) 4 MG tablet TAKE 1/2 TABLET BY MOUTH DAILY OR AS DIRECTED 90 tablet 3  . losartan-hydrochlorothiazide (HYZAAR) 100-25 MG tablet TAKE 1 TABLET BY MOUTH DAILY 90 tablet 0  . Multiple Vitamin (MULTIVITAMIN WITH MINERALS) TABS Take 1 tablet by mouth daily.    . potassium gluconate 595 MG TABS Take 595 mg by mouth daily.    Marland Kitchen SYNTHROID 100 MCG tablet Take 1 tablet (100 mcg total) by mouth daily. 90 tablet 1   No facility-administered medications prior to visit.       Allergies:   Carvedilol; Lipitor [atorvastatin calcium]; Loratadine; Metformin and related; Pravachol; Zocor [simvastatin]; and Metoprolol   Social History   Socioeconomic History  . Marital status: Married    Spouse name: None  . Number of children: None  . Years of education: None  . Highest education level: None  Social Needs  . Financial resource strain: None  . Food insecurity - worry: None  . Food insecurity - inability: None  . Transportation needs - medical: None  . Transportation needs - non-medical: None  Occupational History  . None  Tobacco Use  . Smoking status: Never Smoker  . Smokeless tobacco: Never Used  Substance and Sexual Activity  . Alcohol use: No  . Drug use: No  . Sexual activity: Not Currently  Other Topics Concern  . None  Social History Narrative  . None     Family History:  The patient's family history includes Heart disease in her mother.   ROS:   Please see the history of present illness.    Review of Systems  Eyes: Positive for visual disturbance.   All other systems reviewed and are negative.   Physical Exam:    VS:  BP (!) 146/76   Pulse 77   Resp 16   Ht 5' 2"  (1.575 m)   Wt 151 lb 9.6 oz (68.8 kg)   SpO2 97%   BMI 27.73 kg/m    GEN: Well nourished, well developed, in no acute distress  HEENT: normal  Neck: no JVD, no masses Cardiac: Normal S1/S2,  RRR; 2/6 systolic murmur LSB, rubs, or gallops, no edema    Respiratory:  clear to auscultation bilaterally; no wheezing, rhonchi or rales GI: soft, nontender, nondistended MS: no deformity or atrophy  Skin: warm and dry Neuro: No focal deficits  Psych: Alert and oriented x 3, normal affect  Wt Readings from Last 3 Encounters:  05/15/17 151 lb 9.6 oz (68.8 kg)  09/03/16 161 lb (73 kg)  03/14/16 159 lb 3.2 oz (72.2 kg)      Studies/Labs Reviewed:     EKG:  EKG is   ordered today.  The ekg ordered today demonstrates NSR, 60 bpm, no change from prior.   Recent  Labs: 09/03/2016: ALT 15; BUN 15; Creatinine, Ser 0.81; Hemoglobin 13.7; Platelets 282; Potassium 4.0; Sodium 139; TSH 1.610   Recent Lipid Panel    Component Value Date/Time   CHOL 237 (H) 09/03/2016 1019   TRIG 102 09/03/2016 1019   HDL 88 09/03/2016 1019   CHOLHDL 2.7 09/03/2016 1019   CHOLHDL 2.4 09/02/2015 0745   VLDL 18 09/02/2015 0745   LDLCALC 129 (H) 09/03/2016 1019   LDLDIRECT 120.9 05/01/2013 0929    Additional studies/ records that were reviewed today  include:   Carotid US 7/15 Bilat ICA 1-39%>> FU as needed  TTE: 02/29/2016  Left ventricle:  The cavity size was normal. Wall thickness was normal. Systolic function was normal. The estimated ejection fraction was in the range of 55% to 60%. Wall motion was normal; there were no regional wall motion abnormalities. Features are consistent with a pseudonormal left ventricular filling pattern, with concomitant abnormal relaxation and increased filling pressure (grade 2 diastolic dysfunction). ------------------------------------------------------------------- Aortic valve:   there was no stenosis. There was no regurgitation. ------------------------------------------------------------------- Mitral valve:   Calcified annulus.  Doppler:  There was mild to moderate regurgitation.     ------------------------------------------------------------------- Left atrium:  The atrium was mildly dilated. ------------------------------------------------------------------- Right ventricle:  The cavity size was normal. Systolic function was normal. ------------------------------------------------------------------- Tricuspid valve:   There was mild regurgitation.  Myoview 6/15 Normal stress nuclear study. LV Ejection Fraction: 77%.  EKG: 09/03/2016 shows normal sinus rhythm, moderate voltage criteria for LVH unchanged from prior.   ASSESSMENT:     1. Essential hypertension   2. Mixed hyperlipidemia   3. Hypertensive heart  disease without CHF   4. Mitral valve insufficiency, unspecified etiology   5. Type 2 diabetes mellitus with complication, unspecified whether long term insulin use (HCC)     PLAN:     In order of problems listed above:  1. Hypertensive heart disease without CHF, in the past she was labeled as hypertrophic cardiomyopathy however she has moderate basal septal hypertrophy but otherwise normal wall thickness. LVEF was normal. Her blood pressure is uncontrolled she didn't tolerate metoprolol in the past,  She developed significant dizziness with carvedilol 3.125 mg by mouth twice a day, blood pressure is now controlled I would continue current regimen. It seems to be a fine line between hypertension and hypotension in her, so I won't be adding anything else unless her blood pressure is significantly elevated. She feels great and is a very hard-working person with no symptoms.  2. Carotid stenosis - minimal stenosis bilaterally most recent carotid ultrasound in October 2017, follow-up as needed.    3. Mitral Regurgitation - mild to moderate on the most recent echo in October 2017. Murmur unchanged.  4. HL - She could not tolerate Atorvastatin or Simvastatin.  All lipids at goal in April 2017 on diet and fish oil.  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 Her HDL is really high at 88, triglycerides 102 and LDL in a gray zone of 129. I would continue conservative management.  6. Hypothyroidism - we will recheck free T3 and free T4 and TSH today.  7. Diabetes - she was intolerant to metformin, currently on glimepiride, most recent hemoglobin A1c 9%, I will recheck today, if still elevated I will need to change to different agent.   Medication Adjustments/Labs and Tests Ordered: Current medicines are reviewed at length with the patient today.  Concerns regarding medicines are outlined above.  Medication changes, Labs and Tests ordered  today are outlined in the Patient Instructions noted below. Patient Instructions  Medication Instructions:   Your physician recommends that you continue on your current medications as directed. Please refer to the Current Medication list given to you today.   Labwork:  TODAY--BMET, TSH, FREE T3, FREE T4, HEMOGLOBIN A1C    Follow-Up:  Your physician wants you to follow-up in: [place] will receive a reminder letter in the mail two months in  advance. If you don't receive a letter, please call our office to schedule the follow-up appointment.        If you need a refill on your cardiac medications before your next appointment, please call your pharmacy.    Signed, Ena Dawley, MD  05/15/2017 4:57 PM    [address], [place], [place]  [address] Phone: [phone]; Fax: [phone]

## 2017-05-15 NOTE — Patient Instructions (Signed)
Medication Instructions:   Your physician recommends that you continue on your current medications as directed. Please refer to the Current Medication list given to you today.   Labwork:  TODAY--BMET, TSH, FREE T3, FREE T4, HEMOGLOBIN A1C    Follow-Up:  Your physician wants you to follow-up in: 6 MONTHS WITH DR Johnell ComingsNELSON You will receive a reminder letter in the mail two months in advance. If you don't receive a letter, please call our office to schedule the follow-up appointment.        If you need a refill on your cardiac medications before your next appointment, please call your pharmacy.

## 2017-05-16 LAB — BASIC METABOLIC PANEL
BUN/Creatinine Ratio: 14 (ref 12–28)
BUN: 13 mg/dL (ref 8–27)
CO2: 26 mmol/L (ref 20–29)
Calcium: 9.2 mg/dL (ref 8.7–10.3)
Chloride: 100 mmol/L (ref 96–106)
Creatinine, Ser: 0.92 mg/dL (ref 0.57–1.00)
GFR calc Af Amer: 69 mL/min/{1.73_m2} (ref >59)
GFR calc non Af Amer: 60 mL/min/{1.73_m2} (ref >59)
Glucose: 233 mg/dL — ABNORMAL HIGH (ref 65–99)
Potassium: 3.8 mmol/L (ref 3.5–5.2)
Sodium: 140 mmol/L (ref 134–144)

## 2017-05-16 LAB — T3, FREE: T3, Free: 2.7 pg/mL (ref 2.0–4.4)

## 2017-05-16 LAB — HEMOGLOBIN A1C
Est. average glucose Bld gHb Est-mCnc: 240 mg/dL
Hgb A1c MFr Bld: 10 % — ABNORMAL HIGH (ref 4.8–5.6)

## 2017-05-16 LAB — TSH: TSH: 1.39 u[IU]/mL (ref 0.450–4.500)

## 2017-05-16 LAB — T4, FREE: Free T4: 1.42 ng/dL (ref 0.82–1.77)

## 2017-05-24 ENCOUNTER — Encounter: Payer: Self-pay | Admitting: *Deleted

## 2017-07-27 ENCOUNTER — Other Ambulatory Visit: Payer: Self-pay | Admitting: Cardiology

## 2017-10-28 ENCOUNTER — Other Ambulatory Visit: Payer: Self-pay | Admitting: Cardiology

## 2017-10-28 DIAGNOSIS — E119 Type 2 diabetes mellitus without complications: Secondary | ICD-10-CM

## 2018-01-16 ENCOUNTER — Encounter: Payer: Self-pay | Admitting: Cardiovascular Disease

## 2018-02-10 ENCOUNTER — Ambulatory Visit (INDEPENDENT_AMBULATORY_CARE_PROVIDER_SITE_OTHER): Payer: Medicare Other | Admitting: Cardiovascular Disease

## 2018-02-10 ENCOUNTER — Encounter: Payer: Self-pay | Admitting: Cardiovascular Disease

## 2018-02-10 VITALS — BP 130/72 | HR 65 | Ht 62.0 in | Wt 155.1 lb

## 2018-02-10 DIAGNOSIS — Z789 Other specified health status: Secondary | ICD-10-CM | POA: Diagnosis not present

## 2018-02-10 DIAGNOSIS — I10 Essential (primary) hypertension: Secondary | ICD-10-CM | POA: Diagnosis not present

## 2018-02-10 DIAGNOSIS — E782 Mixed hyperlipidemia: Secondary | ICD-10-CM

## 2018-02-10 NOTE — Patient Instructions (Addendum)
Medication Instructions:  Your physician recommends that you continue on your current medications as directed. Please refer to the Current Medication list given to you today.   Labwork: None Ordered   Testing/Procedures: None Ordered   Follow-Up: Your physician wants you to follow-up in: 1 year with Dr. Elease HashimotoNahser.  You have been referred to Lipid Clinic   Fat and Cholesterol Restricted Diet Getting too much fat and cholesterol in your diet may cause health problems. Following this diet helps keep your fat and cholesterol at normal levels. This can keep you from getting sick. What types of fat should I choose?  Choose monosaturated and polyunsaturated fats. These are found in foods such as olive oil, canola oil, flaxseeds, walnuts, almonds, and seeds.  Eat more omega-3 fats. Good choices include salmon, mackerel, sardines, tuna, flaxseed oil, and ground flaxseeds.  Limit saturated fats. These are in animal products such as meats, butter, and cream. They can also be in plant products such as palm oil, palm kernel oil, and coconut oil.  Avoid foods with partially hydrogenated oils in them. These contain trans fats. Examples of foods that have trans fats are stick margarine, some tub margarines, cookies, crackers, and other baked goods. What general guidelines do I need to follow?  Check food labels. Look for the words "trans fat" and "saturated fat."  When preparing a meal: ? Fill half of your plate with vegetables and green salads. ? Fill one fourth of your plate with whole grains. Look for the word "whole" as the first word in the ingredient list. ? Fill one fourth of your plate with lean protein foods.  Eat more foods that have fiber, like apples, carrots, beans, peas, and barley.  Eat more home-cooked foods. Eat less at restaurants and buffets.  Limit or avoid alcohol.  Limit foods high in starch and sugar.  Limit fried foods.  Cook foods without frying them. Baking,  boiling, grilling, and broiling are all great options.  Lose weight if you are overweight. Losing even a small amount of weight can help your overall health. It can also help prevent diseases such as diabetes and heart disease. What foods can I eat? Grains Whole grains, such as whole wheat or whole grain breads, crackers, cereals, and pasta. Unsweetened oatmeal, bulgur, barley, quinoa, or brown rice. Corn or whole wheat flour tortillas. Vegetables Fresh or frozen vegetables (raw, steamed, roasted, or grilled). Green salads. Fruits All fresh, canned (in natural juice), or frozen fruits. Meat and Other Protein Products Ground beef (85% or leaner), grass-fed beef, or beef trimmed of fat. Skinless chicken or [place]. Ground chicken or [place]. Pork trimmed of fat. All fish and seafood. Eggs. Dried beans, peas, or lentils. Unsalted nuts or seeds. Unsalted canned or dry beans. Dairy Low-fat dairy products, such as skim or 1% milk, 2% or reduced-fat cheeses, low-fat ricotta or cottage cheese, or plain low-fat yogurt. Fats and Oils Tub margarines without trans fats. Light or reduced-fat mayonnaise and salad dressings. Avocado. Olive, canola, sesame, or safflower oils. Natural peanut or almond butter (choose ones without added sugar and oil). The items listed above may not be a complete list of recommended foods or beverages. Contact your dietitian for more options. What foods are not recommended? Grains White bread. White pasta. White rice. Cornbread. Bagels, pastries, and croissants. Crackers that contain trans fat. Vegetables White potatoes. Corn. Creamed or fried vegetables. Vegetables in a cheese sauce. Fruits Dried fruits. Canned fruit in light or heavy syrup. Fruit juice. Meat and Other  Protein Products Fatty cuts of meat. Ribs, chicken wings, bacon, sausage, bologna, salami, chitterlings, fatback, hot dogs, bratwurst, and packaged luncheon meats. Liver and organ meats. Dairy Whole or 2% milk,  cream, half-and-half, and cream cheese. Whole milk cheeses. Whole-fat or sweetened yogurt. Full-fat cheeses. Nondairy creamers and whipped toppings. Processed cheese, cheese spreads, or cheese curds. Sweets and Desserts Corn syrup, sugars, honey, and molasses. Candy. Jam and jelly. Syrup. Sweetened cereals. Cookies, pies, cakes, donuts, muffins, and ice cream. Fats and Oils Butter, stick margarine, lard, shortening, ghee, or bacon fat. Coconut, palm kernel, or palm oils. Beverages Alcohol. Sweetened drinks (such as sodas, lemonade, and fruit drinks or punches). The items listed above may not be a complete list of foods and beverages to avoid. Contact your dietitian for more information. This information is not intended to replace advice given to you by your health care provider. Make sure you discuss any questions you have with your health care provider. Document Released: 11/13/2011 Document Revised: 01/19/2016 Document Reviewed: 08/13/2013 Elsevier Interactive Patient Education  2018 [employer].  You will receive a reminder letter in the mail two months in advance. If you don't receive a letter, please call our office to schedule the follow-up appointment.   If you need a refill on your cardiac medications before your next appointment, please call your pharmacy.   Thank you for choosing [facility]! Eligha Bridegroom, RN [phone]

## 2018-02-10 NOTE — Progress Notes (Signed)
Cardiology Office Note:    Date:  02/10/2018   ID:  Tina [name], DOB 1940/06/06, MRN [id number]  PCP:  Leonard Downing, MD  Cardiologist:  Mare Ferrari  -- > [name]  -->  Nahser  Electrophysiologist:  None   Referring MD: Leonard Downing, *   Problem list 1.  Septal hypertrophy  2.  Carotid stenosis 3.  Hypertension 4.  Hyperlipidemia 5.  Diabetes mellitus 5.  Hypothyroidism  Chief Complaint  Patient presents with  . Hypertension        Tina [name] is a 77 y.o. female with a hx of hypertension, hyperlipidemia, HOCM.  She is previously been seen by Dr. Mare Ferrari and more recently by Dr. [name].  Has not had any problems  Has good energy levels.    Has hyperlipidemia - does not tolerate statins   Does yard work.   Lifts 50 lb bags of fertilizer and lime   Past Medical History:  Diagnosis Date  . Carotid artery disease ([place])    a. Carotid US 7/15 - bilat ICA 40-59% >> FU 1 year //  b. Carotid US 10/17: bilat ICA 1-39% >> FU PRN (will get repeat in 2 years)   . Diabetes mellitus   . Heart murmur   . History of nuclear stress test    a. Myoview 6/15 - Normal stress nuclear study. LV Ejection Fraction: 77%  . HOCM (hypertrophic obstructive cardiomyopathy) ([place])    a. Echo 7/15 - Mild concentric LVH, vigorous LVF, EF 65-70%, LVOT dynamic obstruction at rest with peak gradient 20 mmHg, grade 1 diastolic dysfunction, mild SAM, mild to moderate MR // b. Echo 10/17: EF 55-60%, normal wall motion, grade 2 diastolic dysfunction, MAC, mild to moderate MR, mild LAE (LVOT 9 mmHg)  . Hyperlipidemia   . Hypertension   . Hypothyroidism   . Mitral regurgitation     Past Surgical History:  Procedure Laterality Date  . ABDOMINAL HYSTERECTOMY    . BRAIN SURGERY    . BREAST SURGERY     tumors right breast   . CARDIOVASCULAR STRESS TEST  03/06/2007   EF 86%  . COLONOSCOPY    . GOITER REMOVAL  1972  . SINUS EXPLORATION    . US ECHOCARDIOGRAPHY  10/10/2006   EF 55-60%     Current Medications: No outpatient medications have been marked as taking for the 02/10/18 encounter (Office Visit) with Nahser, Wonda Cheng, MD.     Allergies:   Carvedilol; Lipitor [atorvastatin calcium]; Loratadine; Metformin and related; Pravachol; Zocor [simvastatin]; and Metoprolol   Social History   Socioeconomic History  . Marital status: Married    Spouse name: Not on file  . Number of children: Not on file  . Years of education: Not on file  . Highest education level: Not on file  Occupational History  . Not on file  Social Needs  . Financial resource strain: Not on file  . Food insecurity:    Worry: Not on file    Inability: Not on file  . Transportation needs:    Medical: Not on file    Non-medical: Not on file  Tobacco Use  . Smoking status: Never Smoker  . Smokeless tobacco: Never Used  Substance and Sexual Activity  . Alcohol use: No  . Drug use: No  . Sexual activity: Not Currently  Lifestyle  . Physical activity:    Days per week: Not on file    Minutes per session: Not on file  .  Stress: Not on file  Relationships  . Social connections:    Talks on phone: Not on file    Gets together: Not on file    Attends religious service: Not on file    Active member of club or organization: Not on file    Attends meetings of clubs or organizations: Not on file    Relationship status: Not on file  Other Topics Concern  . Not on file  Social History Narrative  . Not on file     Family History: The patient's family history includes Heart disease in her mother.  ROS:      EKGs/Labs/Other Studies Reviewed:    The following studies were reviewed today:   EKG:    Sept. 16, 2019:   NSR at 1st degree AV block ,  Inc. RBB    Recent Labs: 05/15/2017: BUN 13; Creatinine, Ser 0.92; Potassium 3.8; Sodium 140; TSH 1.390  Recent Lipid Panel    Component Value Date/Time   CHOL 237 (H) 09/03/2016 1019   TRIG 102 09/03/2016 1019   HDL 88 09/03/2016 1019    CHOLHDL 2.7 09/03/2016 1019   CHOLHDL 2.4 09/02/2015 0745   VLDL 18 09/02/2015 0745   LDLCALC 129 (H) 09/03/2016 1019   LDLDIRECT 120.9 05/01/2013 0929    Physical Exam:    VS:  BP 130/72   Pulse 65   Ht 5' 2"  (1.575 m)   Wt 155 lb 1.9 oz (70.4 kg)   SpO2 97%   BMI 28.37 kg/m     Wt Readings from Last 3 Encounters:  02/10/18 155 lb 1.9 oz (70.4 kg)  05/15/17 151 lb 9.6 oz (68.8 kg)  09/03/16 161 lb (73 kg)     GEN:  Well nourished, well developed in no acute distress HEENT: Normal NECK: No JVD; No carotid bruits LYMPHATICS: No lymphadenopathy CARDIAC:   RESPIRATORY:  Clear to auscultation without rales, wheezing or rhonchi  ABDOMEN: Soft, non-tender, non-distended MUSCULOSKELETAL:  No edema; No deformity  SKIN: Warm and dry NEUROLOGIC:  Alert and oriented x 3 PSYCHIATRIC:  Normal affect   ASSESSMENT:    1. Essential hypertension   2. Mixed hyperlipidemia   3. Statin intolerance    PLAN:    In order of problems listed above:  1.   HTN:    BP is well controlled.  Continue current meds.   2.  Hyperlipidemia:  intolerent to all the statins that she has tried. Will refer to lipid clinic     Medication Adjustments/Labs and Tests Ordered: Current medicines are reviewed at length with the patient today.  Concerns regarding medicines are outlined above.  Orders Placed This Encounter  Procedures  . AMB Referral to [facility]  . EKG 12-Lead   No orders of the defined types were placed in this encounter.    Patient Instructions  Medication Instructions:  Your physician recommends that you continue on your current medications as directed. Please refer to the Current Medication list given to you today.   Labwork: None Ordered   Testing/Procedures: None Ordered   Follow-Up: Your physician wants you to follow-up in: 1 year with Dr. Acie Fredrickson.  You have been referred to Lipid Clinic   Fat and Cholesterol Restricted Diet Getting too  much fat and cholesterol in your diet may cause health problems. Following this diet helps keep your fat and cholesterol at normal levels. This can keep you from getting sick. What types of fat should I choose?  Choose monosaturated  and polyunsaturated fats. These are found in foods such as olive oil, canola oil, flaxseeds, walnuts, almonds, and seeds.  Eat more omega-3 fats. Good choices include salmon, mackerel, sardines, tuna, flaxseed oil, and ground flaxseeds.  Limit saturated fats. These are in animal products such as meats, butter, and cream. They can also be in plant products such as palm oil, palm kernel oil, and coconut oil.  Avoid foods with partially hydrogenated oils in them. These contain trans fats. Examples of foods that have trans fats are stick margarine, some tub margarines, cookies, crackers, and other baked goods. What general guidelines do I need to follow?  Check food labels. Look for the words "trans fat" and "saturated fat."  When preparing a meal: ? Fill half of your plate with vegetables and green salads. ? Fill one fourth of your plate with whole grains. Look for the word "whole" as the first word in the ingredient list. ? Fill one fourth of your plate with lean protein foods.  Eat more foods that have fiber, like apples, carrots, beans, peas, and barley.  Eat more home-cooked foods. Eat less at restaurants and buffets.  Limit or avoid alcohol.  Limit foods high in starch and sugar.  Limit fried foods.  Cook foods without frying them. Baking, boiling, grilling, and broiling are all great options.  Lose weight if you are overweight. Losing even a small amount of weight can help your overall health. It can also help prevent diseases such as diabetes and heart disease. What foods can I eat? Grains Whole grains, such as whole wheat or whole grain breads, crackers, cereals, and pasta. Unsweetened oatmeal, bulgur, barley, quinoa, or brown rice. Corn or whole  wheat flour tortillas. Vegetables Fresh or frozen vegetables (raw, steamed, roasted, or grilled). Green salads. Fruits All fresh, canned (in natural juice), or frozen fruits. Meat and Other Protein Products Ground beef (85% or leaner), grass-fed beef, or beef trimmed of fat. Skinless chicken or [place]. Ground chicken or [place]. Pork trimmed of fat. All fish and seafood. Eggs. Dried beans, peas, or lentils. Unsalted nuts or seeds. Unsalted canned or dry beans. Dairy Low-fat dairy products, such as skim or 1% milk, 2% or reduced-fat cheeses, low-fat ricotta or cottage cheese, or plain low-fat yogurt. Fats and Oils Tub margarines without trans fats. Light or reduced-fat mayonnaise and salad dressings. Avocado. Olive, canola, sesame, or safflower oils. Natural peanut or almond butter (choose ones without added sugar and oil). The items listed above may not be a complete list of recommended foods or beverages. Contact your dietitian for more options. What foods are not recommended? Grains White bread. White pasta. White rice. Cornbread. Bagels, pastries, and croissants. Crackers that contain trans fat. Vegetables White potatoes. Corn. Creamed or fried vegetables. Vegetables in a cheese sauce. Fruits Dried fruits. Canned fruit in light or heavy syrup. Fruit juice. Meat and Other Protein Products Fatty cuts of meat. Ribs, chicken wings, bacon, sausage, bologna, salami, chitterlings, fatback, hot dogs, bratwurst, and packaged luncheon meats. Liver and organ meats. Dairy Whole or 2% milk, cream, half-and-half, and cream cheese. Whole milk cheeses. Whole-fat or sweetened yogurt. Full-fat cheeses. Nondairy creamers and whipped toppings. Processed cheese, cheese spreads, or cheese curds. Sweets and Desserts Corn syrup, sugars, honey, and molasses. Candy. Jam and jelly. Syrup. Sweetened cereals. Cookies, pies, cakes, donuts, muffins, and ice cream. Fats and Oils Butter, stick margarine, lard,  shortening, ghee, or bacon fat. Coconut, palm kernel, or palm oils. Beverages Alcohol. Sweetened drinks (such as sodas, lemonade,  and fruit drinks or punches). The items listed above may not be a complete list of foods and beverages to avoid. Contact your dietitian for more information. This information is not intended to replace advice given to you by your health care provider. Make sure you discuss any questions you have with your health care provider. Document Released: 11/13/2011 Document Revised: 01/19/2016 Document Reviewed: 08/13/2013 Elsevier Interactive Patient Education  2018 [place] will receive a reminder letter in the mail two months in advance. If you don't receive a letter, please call our office to schedule the follow-up appointment.   If you need a refill on your cardiac medications before your next appointment, please call your pharmacy.   Thank you for choosing [facility]! Christen Bame, RN [phone]       Signed, Mertie Moores, MD  02/10/2018 2:49 PM    [place]

## 2018-02-28 ENCOUNTER — Ambulatory Visit: Payer: Medicare Other

## 2018-03-20 ENCOUNTER — Emergency Department (HOSPITAL_COMMUNITY): Payer: No Typology Code available for payment source

## 2018-03-20 ENCOUNTER — Emergency Department (HOSPITAL_COMMUNITY)
Admission: EM | Admit: 2018-03-20 | Discharge: 2018-03-20 | Disposition: A | Discharge status: Home / Self Care | Payer: No Typology Code available for payment source | Attending: Emergency Medicine | Admitting: Emergency Medicine

## 2018-03-20 ENCOUNTER — Other Ambulatory Visit: Payer: Self-pay

## 2018-03-20 ENCOUNTER — Encounter (HOSPITAL_COMMUNITY): Payer: Self-pay | Admitting: Emergency Medicine

## 2018-03-20 DIAGNOSIS — Y998 Other external cause status: Secondary | ICD-10-CM | POA: Diagnosis not present

## 2018-03-20 DIAGNOSIS — E119 Type 2 diabetes mellitus without complications: Secondary | ICD-10-CM | POA: Insufficient documentation

## 2018-03-20 DIAGNOSIS — S20211A Contusion of right front wall of thorax, initial encounter: Secondary | ICD-10-CM | POA: Insufficient documentation

## 2018-03-20 DIAGNOSIS — I2510 Atherosclerotic heart disease of native coronary artery without angina pectoris: Secondary | ICD-10-CM | POA: Diagnosis not present

## 2018-03-20 DIAGNOSIS — T1490XA Injury, unspecified, initial encounter: Secondary | ICD-10-CM

## 2018-03-20 DIAGNOSIS — Y92410 Unspecified street and highway as the place of occurrence of the external cause: Secondary | ICD-10-CM | POA: Diagnosis not present

## 2018-03-20 DIAGNOSIS — Y93I9 Activity, other involving external motion: Secondary | ICD-10-CM | POA: Insufficient documentation

## 2018-03-20 DIAGNOSIS — S6992XA Unspecified injury of left wrist, hand and finger(s), initial encounter: Secondary | ICD-10-CM | POA: Diagnosis present

## 2018-03-20 DIAGNOSIS — I10 Essential (primary) hypertension: Secondary | ICD-10-CM | POA: Diagnosis not present

## 2018-03-20 DIAGNOSIS — S60222A Contusion of left hand, initial encounter: Secondary | ICD-10-CM | POA: Insufficient documentation

## 2018-03-20 LAB — CBG MONITORING, ED: Glucose-Capillary: 96 mg/dL (ref 70–99)

## 2018-03-20 NOTE — ED Notes (Signed)
CBG 96. 

## 2018-03-20 NOTE — ED Notes (Signed)
Patient verbalizes understanding of discharge instructions. Opportunity for questioning and answers were provided. Armband removed by staff, pt discharged from ED. Pt ambulatory to lobby.  
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
## 2018-03-20 NOTE — ED Notes (Signed)
ED Provider at bedside. 

## 2018-04-03 ENCOUNTER — Ambulatory Visit (INDEPENDENT_AMBULATORY_CARE_PROVIDER_SITE_OTHER): Payer: Medicare Other | Admitting: Pharmacist

## 2018-04-03 DIAGNOSIS — E782 Mixed hyperlipidemia: Secondary | ICD-10-CM

## 2018-04-03 MED ORDER — ROSUVASTATIN CALCIUM 10 MG PO TABS: 10.0000 mg | ORAL_TABLET | Freq: Every day | ORAL | 11 refills | Status: DC

## 2018-04-03 NOTE — Patient Instructions (Addendum)
It was nice to meet you today  Start taking rosuvastatin (Crestor) 10mg  once a day for your cholesterol  Try to decrease your sausage biscuits  Your LDL goal is < 70  Follow up for fasting lab work in 3 months - come in any time after 7:30am on Monday, February 3rd to recheck your cholesterol  Call Edom Schmuhl, Pharmacist with any concerns about your cholesterol
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
## 2018-04-04 ENCOUNTER — Telehealth: Payer: Self-pay

## 2018-04-04 MED ORDER — HYDROCHLOROTHIAZIDE 25 MG PO TABS: 25.0000 mg | ORAL_TABLET | Freq: Every day | ORAL | 3 refills | Status: DC

## 2018-04-04 MED ORDER — LOSARTAN POTASSIUM 100 MG PO TABS: 100.0000 mg | ORAL_TABLET | Freq: Every day | ORAL | 3 refills | Status: DC

## 2018-04-04 NOTE — Telephone Encounter (Signed)
[facility] reports they are out of Hyzaar. Confirmed with pharmacist they have losartan 100 mg and HCTZ 25 mg.  New prescriptions sent in. The pharmacist agrees with plan.

## 2018-04-30 ENCOUNTER — Other Ambulatory Visit: Payer: Self-pay | Admitting: Family Medicine

## 2018-04-30 DIAGNOSIS — Z1231 Encounter for screening mammogram for malignant neoplasm of breast: Secondary | ICD-10-CM

## 2018-06-10 ENCOUNTER — Other Ambulatory Visit: Payer: Self-pay | Admitting: Family Medicine

## 2018-06-10 ENCOUNTER — Ambulatory Visit
Admission: RE | Admit: 2018-06-10 | Discharge: 2018-06-10 | Disposition: A | Discharge status: Home / Self Care | Payer: Medicare Other | Source: Ambulatory Visit | Attending: Family Medicine | Admitting: Family Medicine

## 2018-06-10 DIAGNOSIS — Z1231 Encounter for screening mammogram for malignant neoplasm of breast: Secondary | ICD-10-CM

## 2018-06-30 ENCOUNTER — Other Ambulatory Visit: Payer: Medicare Other | Admitting: *Deleted

## 2018-06-30 DIAGNOSIS — E782 Mixed hyperlipidemia: Secondary | ICD-10-CM

## 2018-06-30 LAB — LIPID PANEL
CHOLESTEROL TOTAL: 156 mg/dL (ref 100–199)
Chol/HDL Ratio: 1.9 ratio (ref 0.0–4.4)
HDL: 83 mg/dL (ref >39)
LDL CALC: 59 mg/dL (ref 0–99)
TRIGLYCERIDES: 71 mg/dL (ref 0–149)
VLDL CHOLESTEROL CAL: 14 mg/dL (ref 5–40)

## 2018-06-30 LAB — HEPATIC FUNCTION PANEL
ALT: 10 IU/L (ref 0–32)
AST: 11 IU/L (ref 0–40)
Albumin: 4.3 g/dL (ref 3.7–4.7)
Alkaline Phosphatase: 84 IU/L (ref 39–117)
Bilirubin Total: 0.4 mg/dL (ref 0.0–1.2)
Bilirubin, Direct: 0.11 mg/dL (ref 0.00–0.40)
Total Protein: 6.6 g/dL (ref 6.0–8.5)

## 2019-08-04 ENCOUNTER — Other Ambulatory Visit: Payer: Self-pay | Admitting: Cardiology

## 2019-08-07 ENCOUNTER — Ambulatory Visit
Admission: EM | Admit: 2019-08-07 | Discharge: 2019-08-07 | Discharge status: Against Medical Advice | Payer: Medicare Other

## 2019-08-07 ENCOUNTER — Ambulatory Visit
Admission: EM | Admit: 2019-08-07 | Discharge: 2019-08-07 | Disposition: A | Discharge status: Home / Self Care | Payer: Medicare Other

## 2019-08-07 ENCOUNTER — Other Ambulatory Visit: Payer: Self-pay

## 2019-08-07 DIAGNOSIS — I10 Essential (primary) hypertension: Secondary | ICD-10-CM

## 2019-08-07 DIAGNOSIS — E08621 Diabetes mellitus due to underlying condition with foot ulcer: Secondary | ICD-10-CM | POA: Diagnosis not present

## 2019-08-07 DIAGNOSIS — L97421 Non-pressure chronic ulcer of left heel and midfoot limited to breakdown of skin: Secondary | ICD-10-CM

## 2019-08-07 NOTE — Discharge Instructions (Signed)
Keep area(s) clean and dry. Apply hot compress / towel for 5-10 minutes 3-5 times daily. Return for worsening pain, redness, swelling, discharge, fever.  Helpful prevention tips: Keep nails short to avoid secondary skin infections. 

## 2019-08-07 NOTE — ED Triage Notes (Addendum)
Pt presents with complaints of sore on top of left foot. States it started draining. She first noticed the area x 3 weeks ago. Reports seeing her pcp and being started on Doxycycline x 2 weeks and feels it looks worse.

## 2019-08-07 NOTE — ED Provider Notes (Signed)
[facility]    CSN: [id number] Arrival date & time: 08/07/19  1313      History   Chief Complaint Chief Complaint  Patient presents with  . Wound Check    HPI Tina [name] is a 79 y.o. female with history of CAD, hypertension, diabetes presenting for wound check.  States that she was treated for diabetic foot ulcer over the dorsal aspect of her left foot by her primary care a few weeks ago.  States she took doxycycline for 2 weeks without significant improvement.  Patient denies pain, pruritus, streaking, antalgic gait, fever, myalgias or arthralgias.  Has been keeping covered with Band-Aid, wearing white socks only.   Past Medical History:  Diagnosis Date  . Carotid artery disease ([place])    a. Carotid US 7/15 - bilat ICA 40-59% >> FU 1 year //  b. Carotid US 10/17: bilat ICA 1-39% >> FU PRN (will get repeat in 2 years)   . Diabetes mellitus   . Heart murmur   . History of nuclear stress test    a. Myoview 6/15 - Normal stress nuclear study. LV Ejection Fraction: 77%  . HOCM (hypertrophic obstructive cardiomyopathy) ([place])    a. Echo 7/15 - Mild concentric LVH, vigorous LVF, EF 65-70%, LVOT dynamic obstruction at rest with peak gradient 20 mmHg, grade 1 diastolic dysfunction, mild SAM, mild to moderate MR // b. Echo 10/17: EF 55-60%, normal wall motion, grade 2 diastolic dysfunction, MAC, mild to moderate MR, mild LAE (LVOT 9 mmHg)  . Hyperlipidemia   . Hypertension   . Hypothyroidism   . Mitral regurgitation     Patient Active Problem List   Diagnosis Date Noted  . Mixed hyperlipidemia 04/03/2018  . Right carotid bruit 12/16/2013  . Hypertensive urgency 07/01/2012  . Chest pain 07/01/2012  . Dyslipidemia 01/10/2012  . Benign hypertensive heart disease without heart failure 12/11/2010  . Hypothyroidism 12/11/2010  . Aortic valve disease 12/11/2010  . Sinusitis acute 12/11/2010  . Diabetes ([place]) 04/17/2010  . HEMORRHOIDS-EXTERNAL 04/17/2010    Past  Surgical History:  Procedure Laterality Date  . ABDOMINAL HYSTERECTOMY    . BRAIN SURGERY    . BREAST SURGERY     tumors right breast   . CARDIOVASCULAR STRESS TEST  03/06/2007   EF 86%  . COLONOSCOPY    . GOITER REMOVAL  1972  . SINUS EXPLORATION    . US ECHOCARDIOGRAPHY  10/10/2006   EF 55-60%    OB History   No obstetric history on file.      Home Medications    Prior to Admission medications   Medication Sig Start Date End Date Taking? Authorizing Provider  aspirin 81 MG tablet Take 81 mg by mouth daily.     Yes [provider]  doxycycline (DORYX) 100 MG EC tablet Take 100 mg by mouth 2 (two) times daily.   Yes [provider]  glimepiride (AMARYL) 4 MG tablet Take 6 mg by mouth daily with breakfast.   Yes [provider]  losartan (COZAAR) 100 MG tablet Take 1 tablet (100 mg total) by mouth daily. Please schedule appt for future refills. 08/04/19  Yes Dorothy Spark, MD  SYNTHROID 100 MCG tablet Take 1 tablet (100 mcg total) by mouth daily. 08/10/16 08/07/19 Yes Dorothy Spark, MD  hydrochlorothiazide (HYDRODIURIL) 25 MG tablet Take 1 tablet (25 mg total) by mouth daily. 04/04/18 03/30/19  Dorothy Spark, MD  rosuvastatin (CRESTOR) 10 MG tablet Take 1 tablet (  10 mg total) by mouth daily. 04/03/18 07/02/18  Nahser, Wonda Cheng, MD  vitamin B-12 (CYANOCOBALAMIN) 100 MCG tablet Take 100 mcg by mouth daily.    [provider]    Family History Family History  Problem Relation Age of Onset  . Heart disease Mother   . Healthy Father   . Breast cancer Neg Hx     Social History Social History   Tobacco Use  . Smoking status: Never Smoker  . Smokeless tobacco: Never Used  Substance Use Topics  . Alcohol use: No  . Drug use: No     Allergies   Carvedilol, Lipitor [atorvastatin calcium], Loratadine, Metformin and related, Pravachol, Zocor [simvastatin], and Metoprolol   Review of Systems As per HPI   Physical Exam Triage  Vital Signs ED Triage Vitals  Enc Vitals Group     BP 08/07/19 1331 (!) 173/84     Pulse Rate 08/07/19 1331 76     Resp 08/07/19 1331 17     Temp 08/07/19 1331 98.1 F (36.7 C)     Temp src --      SpO2 08/07/19 1331 98 %     Weight --      Height --      Head Circumference --      Peak Flow --      Pain Score 08/07/19 1328 0     Pain Loc --      Pain Edu? --      Excl. in [place]? --    No data found.  Updated Vital Signs BP (!) 173/84   Pulse 76   Temp 98.1 F (36.7 C)   Resp 17   SpO2 98%   Visual Acuity Right Eye Distance:   Left Eye Distance:   Bilateral Distance:    Right Eye Near:   Left Eye Near:    Bilateral Near:     Physical Exam Constitutional:      General: She is not in acute distress. HENT:     Head: Normocephalic and atraumatic.  Eyes:     General: No scleral icterus.    Pupils: Pupils are equal, round, and reactive to light.  Cardiovascular:     Rate and Rhythm: Normal rate.  Pulmonary:     Effort: Pulmonary effort is normal.  Musculoskeletal:        General: No swelling or tenderness. Normal range of motion.     Right lower leg: No edema.     Left lower leg: No edema.  Skin:    Capillary Refill: Capillary refill takes less than 2 seconds.     Coloration: Skin is not jaundiced or pale.     Findings: Lesion present.     Comments: 1 cm superficial laceration over dorsal aspect of left foot.  Does have 3/4 cm surrounding erythema without warmth, TTP, streaking.  No open wound or active discharge.  Good granulation tissue.  Erythema blanches < 2 seconds  Neurological:     Mental Status: She is alert and oriented to person, place, and time.     Gait: Gait normal.      UC Treatments / Results  Labs (all labs ordered are listed, but only abnormal results are displayed) Labs Reviewed - No data to display  EKG   Radiology No results found.  Procedures Procedures (including critical care time)  Medications Ordered in UC Medications -  No data to display  Initial Impression / Assessment and Plan / UC Course  I  have reviewed the triage vital signs and the nursing notes.  Pertinent labs & imaging results that were available during my care of the patient were reviewed by me and considered in my medical decision making (see chart for details).     Afebrile, nontoxic in office today.  Status post antibiotics without significant improvement in appearance.  Low concern for cellulitis at this time given lack of pain, warmth, fever, normal gait.  Patient follow-up with podiatry for further evaluation/management and routine diabetic foot care.  Verbalized reassurance: We will continue to treat supportively and monitor.  Return precautions discussed, patient verbalized understanding and is agreeable to plan. Final Clinical Impressions(s) / UC Diagnoses   Final diagnoses:  Diabetic ulcer of left midfoot associated with diabetes mellitus due to underlying condition, limited to breakdown of skin [facility] 2)     Discharge Instructions     Keep area(s) clean and dry. Apply hot compress / towel for 5-10 minutes 3-5 times daily. Return for worsening pain, redness, swelling, discharge, fever.  Helpful prevention tips: Keep nails short to avoid secondary skin infections.    ED Prescriptions    None     PDMP not reviewed this encounter.   [place], [place], [place] 08/07/19 1356

## 2020-03-30 ENCOUNTER — Encounter: Payer: Self-pay | Admitting: Cardiovascular Disease

## 2020-03-30 NOTE — Progress Notes (Signed)
This encounter was created in error - please disregard.

## 2020-03-31 ENCOUNTER — Encounter: Payer: Medicare Other | Admitting: Cardiovascular Disease

## 2020-04-24 ENCOUNTER — Encounter: Payer: Self-pay | Admitting: Cardiovascular Disease

## 2020-04-24 NOTE — Progress Notes (Signed)
Cardiology Office Note:    Date:  04/25/2020   ID:  Tina [name], DOB 12-Oct-1940, MRN [id number]  PCP:  Leonard Downing, MD  Cardiologist:  Mare Ferrari  -- > [name]  -->  [name]  Electrophysiologist:  None   Referring MD: Leonard Downing, *   Problem list 1.  Septal hypertrophy  2.  Carotid stenosis 3.  Hypertension 4.  Hyperlipidemia 5.  Diabetes mellitus 5.  Hypothyroidism  Chief Complaint  Patient presents with  . Hypertension        Tina [name] is a 79 y.o. female with a hx of hypertension, hyperlipidemia, HOCM.  She is previously been seen by Dr. Mare Ferrari and more recently by Dr. [name].  Has not had any problems  Has good energy levels.    Has hyperlipidemia - does not tolerate statins   Does yard work.   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 29, 2021: Tina [name] is seen today for follow up of her HTN, HOCM. 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  Still busy, No CP , no dyspnea Has some fatigue     Past Medical History:  Diagnosis Date  . Carotid artery disease ([place])    a. Carotid US 7/15 - bilat ICA 40-59% >> FU 1 year //  b. Carotid US 10/17: bilat ICA 1-39% >> FU PRN (will get repeat in 2 years)   . Diabetes mellitus   . Heart murmur   . History of nuclear stress test    a. Myoview 6/15 - Normal stress nuclear study. LV Ejection Fraction: 77%  . HOCM (hypertrophic obstructive cardiomyopathy) ([place])    a. Echo 7/15 - Mild concentric LVH, vigorous LVF, EF 65-70%, LVOT dynamic obstruction at rest with peak gradient 20 mmHg, grade 1 diastolic dysfunction, mild SAM, mild to moderate MR // b. Echo 10/17: EF 55-60%, normal wall motion, grade 2 diastolic dysfunction, MAC, mild to moderate MR, mild LAE (LVOT 9 mmHg)  . Hyperlipidemia   . Hypertension   . Hypothyroidism   . Mitral regurgitation     Past Surgical History:  Procedure Laterality Date  . ABDOMINAL HYSTERECTOMY    . BRAIN SURGERY    . BREAST SURGERY      tumors right breast   . CARDIOVASCULAR STRESS TEST  03/06/2007   EF 86%  . COLONOSCOPY    . GOITER REMOVAL  1972  . SINUS EXPLORATION    . US ECHOCARDIOGRAPHY  10/10/2006   EF 55-60%    Current Medications: Current Meds  Medication Sig  . aspirin 81 MG tablet Take 81 mg by mouth daily.    Marland Kitchen donepezil (ARICEPT) 5 MG tablet Take 5 mg by mouth daily.  Marland Kitchen glipiZIDE (GLUCOTROL XL) 10 MG 24 hr tablet Take 10 mg by mouth daily.  Marland Kitchen losartan (COZAAR) 100 MG tablet Take 1 tablet (100 mg total) by mouth daily. Please schedule appt for future refills.  . rosuvastatin (CRESTOR) 10 MG tablet Take 1 tablet (10 mg total) by mouth daily.  Marland Kitchen SYNTHROID 100 MCG tablet Take 1 tablet (100 mcg total) by mouth daily.  . [DISCONTINUED] doxycycline (DORYX) 100 MG EC tablet Take 100 mg by mouth 2 (two) times daily.  . [DISCONTINUED] glimepiride (AMARYL) 4 MG tablet Take 6 mg by mouth daily with breakfast.  . [DISCONTINUED] hydrochlorothiazide (HYDRODIURIL) 25 MG tablet Take 1 tablet (25 mg total) by mouth daily.  . [DISCONTINUED] losartan (COZAAR) 100 MG tablet Take 1 tablet (  100 mg total) by mouth daily. Please schedule appt for future refills.  . [DISCONTINUED] vitamin B-12 (CYANOCOBALAMIN) 100 MCG tablet Take 100 mcg by mouth daily.     Allergies:   Carvedilol, Lipitor [atorvastatin calcium], Loratadine, Metformin and related, Pravachol, Zocor [simvastatin], and Metoprolol   Social History   Socioeconomic History  . Marital status: Married    Spouse name: Not on file  . Number of children: Not on file  . Years of education: Not on file  . Highest education level: Not on file  Occupational History  . Not on file  Tobacco Use  . Smoking status: Never Smoker  . Smokeless tobacco: Never Used  Vaping Use  . Vaping Use: Never used  Substance and Sexual Activity  . Alcohol use: No  . Drug use: No  . Sexual activity: Not Currently  Other Topics Concern  . Not on file  Social History Narrative  . Not  on file   Social Determinants of Health   Financial Resource Strain:   . Difficulty of Paying Living Expenses: Not on file  Food Insecurity:   . Worried About Charity fundraiser in the Last Year: Not on file  . Ran Out of Food in the Last Year: Not on file  Transportation Needs:   . Lack of Transportation (Medical): Not on file  . Lack of Transportation (Non-Medical): Not on file  Physical Activity:   . Days of Exercise per Week: Not on file  . Minutes of Exercise per Session: Not on file  Stress:   . Feeling of Stress : Not on file  Social Connections:   . Frequency of Communication with Friends and Family: Not on file  . Frequency of Social Gatherings with Friends and Family: Not on file  . Attends Religious Services: Not on file  . Active Member of Clubs or Organizations: Not on file  . Attends Archivist Meetings: Not on file  . Marital Status: Not on file     Family History: The patient's family history includes Healthy in her father; Heart disease in her mother. There is no history of Breast cancer.  ROS:      EKGs/Labs/Other Studies Reviewed:    The following studies were reviewed today:      Recent Labs: No results found for requested labs within last 8760 hours.  Recent Lipid Panel    Component Value Date/Time   CHOL 156 06/30/2018 0914   TRIG 71 06/30/2018 0914   HDL 83 06/30/2018 0914   CHOLHDL 1.9 06/30/2018 0914   CHOLHDL 2.4 09/02/2015 0745   VLDL 18 09/02/2015 0745   LDLCALC 59 06/30/2018 0914   LDLDIRECT 120.9 05/01/2013 0929    Physical Exam:    Physical Exam: Blood pressure 138/88, pulse 83, height _0  (1.575 m), weight 143 lb (64.9 kg), SpO2 98 %.  GEN:  Well nourished, well developed in no acute distress HEENT: Normal NECK: No JVD; No carotid bruits LYMPHATICS: No lymphadenopathy CARDIAC: RRR , no murmurs, rubs, gallops RESPIRATORY:  Clear to auscultation without rales, wheezing or rhonchi  ABDOMEN: Soft, non-tender,  non-distended MUSCULOSKELETAL:  No edema; No deformity  SKIN: Warm and dry NEUROLOGIC:  Alert and oriented x 3   EKG:     Nov. 29, 2021:  NSR at 83.  1st degree AV block ,  No changes   ASSESSMENT:    1. Benign hypertensive heart disease without heart failure    PLAN:  1.   HTN:      Will refill Losartan .  She remains very active  BP is well controled  Labs have been done by Dr. Arelia Sneddon.   She will ask him to send [place] her most recent labs.    2.  Hyperlipidemia:   Followed by Dr. Arelia Sneddon    Medication Adjustments/Labs and Tests Ordered: Current medicines are reviewed at length with the patient today.  Concerns regarding medicines are outlined above.  Orders Placed This Encounter  Procedures  . EKG 12-Lead   Meds ordered this encounter  Medications  . losartan (COZAAR) 100 MG tablet    Sig: Take 1 tablet (100 mg total) by mouth daily. Please schedule appt for future refills.    Dispense:  90 tablet    Refill:  3     Patient Instructions  Medication Instructions:  Your physician recommends that you continue on your current medications as directed. Please refer to the Current Medication list given to you today.  *If you need a refill on your cardiac medications before your next appointment, please call your pharmacy*   Lab Work: None If you have labs (blood work) drawn today and your tests are completely normal, you will receive your results only by: Marland Kitchen MyChart Message (if you have MyChart) OR . A paper copy in the mail If you have any lab test that is abnormal or we need to change your treatment, we will call you to review the results.   Testing/Procedures: None   Follow-Up: At [facility], you and your health needs are our priority.  As part of our continuing mission to provide you with exceptional heart care, we have created designated Provider Care Teams.  These Care Teams include your primary Cardiologist (physician) and Advanced Practice Providers  (APPs -  Physician Assistants and Nurse Practitioners) who all work together to provide you with the care you need, when you need it.  We recommend signing up for the patient portal called "MyChart".  Sign up information is provided on this After Visit Summary.  MyChart is used to connect with patients for Virtual Visits (Telemedicine).  Patients are able to view lab/test results, encounter notes, upcoming appointments, etc.  Non-urgent messages can be sent to your provider as well.   To learn more about what you can do with MyChart, go to [email or web address].    Your next appointment:   1 year(s)  The format for your next appointment:   In Person  Provider:   You may see Dr. Mertie Moores or one of the following Advanced Practice Providers on your designated Care Team:    Richardson Dopp, PA-C  Robbie Lis, [place]    Other Instructions      Signed, Mertie Moores, MD  04/25/2020 4:43 PM    [place]

## 2020-04-25 ENCOUNTER — Encounter: Payer: Self-pay | Admitting: Cardiovascular Disease

## 2020-04-25 ENCOUNTER — Ambulatory Visit (INDEPENDENT_AMBULATORY_CARE_PROVIDER_SITE_OTHER): Payer: Medicare Other | Admitting: Cardiovascular Disease

## 2020-04-25 ENCOUNTER — Other Ambulatory Visit: Payer: Self-pay

## 2020-04-25 VITALS — BP 138/88 | HR 83 | Ht 62.0 in | Wt 143.0 lb

## 2020-04-25 DIAGNOSIS — I119 Hypertensive heart disease without heart failure: Secondary | ICD-10-CM | POA: Diagnosis not present

## 2020-04-25 MED ORDER — LOSARTAN POTASSIUM 100 MG PO TABS: 100.0000 mg | ORAL_TABLET | Freq: Every day | ORAL | 3 refills | Status: DC

## 2020-04-25 NOTE — Patient Instructions (Signed)
Medication Instructions:  Your physician recommends that you continue on your current medications as directed. Please refer to the Current Medication list given to you today.  *If you need a refill on your cardiac medications before your next appointment, please call your pharmacy*   Lab Work: None If you have labs (blood work) drawn today and your tests are completely normal, you will receive your results only by: . MyChart Message (if you have MyChart) OR . A paper copy in the mail If you have any lab test that is abnormal or we need to change your treatment, we will call you to review the results.   Testing/Procedures: None   Follow-Up: At [facility], you and your health needs are our priority.  As part of our continuing mission to provide you with exceptional heart care, we have created designated Provider Care Teams.  These Care Teams include your primary Cardiologist (physician) and Advanced Practice Providers (APPs -  Physician Assistants and Nurse Practitioners) who all work together to provide you with the care you need, when you need it.  We recommend signing up for the patient portal called "MyChart".  Sign up information is provided on this After Visit Summary.  MyChart is used to connect with patients for Virtual Visits (Telemedicine).  Patients are able to view lab/test results, encounter notes, upcoming appointments, etc.  Non-urgent messages can be sent to your provider as well.   To learn more about what you can do with MyChart, go to [email or web address].    Your next appointment:   1 year(s)  The format for your next appointment:   In Person  Provider:   You may see Dr. Philip Nahser or one of the following Advanced Practice Providers on your designated Care Team:    Scott Weaver, PA-C  Vin Bhagat, PA-C    Other Instructions   

## 2021-04-13 ENCOUNTER — Encounter: Payer: Self-pay | Admitting: *Deleted
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
## 2021-04-18 ENCOUNTER — Ambulatory Visit (INDEPENDENT_AMBULATORY_CARE_PROVIDER_SITE_OTHER): Payer: Medicare Other | Admitting: Psychiatry

## 2021-04-18 ENCOUNTER — Encounter: Payer: Self-pay | Admitting: Psychiatry

## 2021-04-18 VITALS — BP 142/84 | HR 75 | Ht 62.0 in | Wt 133.0 lb

## 2021-04-18 DIAGNOSIS — E119 Type 2 diabetes mellitus without complications: Secondary | ICD-10-CM

## 2021-04-18 DIAGNOSIS — R413 Other amnesia: Secondary | ICD-10-CM | POA: Diagnosis not present

## 2021-04-18 MED ORDER — MEMANTINE HCL 10 MG PO TABS: ORAL_TABLET | ORAL | 2 refills | Status: AC

## 2021-04-18 NOTE — Patient Instructions (Addendum)
Dr. Junious Silk (this is a medication for your memory) watch for headache or dizziness week one:  take 1/2 tablet ($RemoveBef'5mg'ALHHINjxce$ ) in the morning  week two:  take 1/2 tablet ($RemoveBef'5mg'tPbeEBTMKT$ ) in the morning and take 1/2 tablet ($RemoveBef'5mg'OMhppwKHZb$ ) in the evening week three: take 1/2 tablet ($RemoveBef'5mg'eGPTkzamCD$ ) in the morning and take 1 tablet ($RemoveB'10mg'XCajXevv$ ) in the evening week four:  take 1 tablet ($RemoveB'10mg'vVqfDzEm$ ) in the morning and take 1 tablet ($RemoveB'10mg'aYwZggbP$ ) in the evening 2. MRI brain 3. Blood work   Tasks to improve attention/working memory 1. Good sleep hygiene (7-8 hrs of sleep) 2. Learning a new skill (Painting, Carpentry, Pottery, new language, Knitting). 3.Cognitive exercises (keep a daily journal, Puzzles) 4. Physical exercise and training  (30 min/day X 4 days week) 5. Being on Antidepressant if needed 6.Yoga, Meditation, Tai Chi 7. Decrease alcohol intake 8.Have a clear schedule and structure in daily routine

## 2021-04-19 ENCOUNTER — Telehealth: Payer: Self-pay

## 2021-04-19 LAB — CBC WITH DIFFERENTIAL/PLATELET
Basophils Absolute: 0 10*3/uL (ref 0.0–0.2)
Basos: 1 %
EOS (ABSOLUTE): 0.2 10*3/uL (ref 0.0–0.4)
Eos: 6 %
Hematocrit: 41.1 % (ref 34.0–46.6)
Hemoglobin: 13.2 g/dL (ref 11.1–15.9)
Immature Grans (Abs): 0 10*3/uL (ref 0.0–0.1)
Immature Granulocytes: 0 %
Lymphocytes Absolute: 0.9 10*3/uL (ref 0.7–3.1)
Lymphs: 23 %
MCH: 25.7 pg — ABNORMAL LOW (ref 26.6–33.0)
MCHC: 32.1 g/dL (ref 31.5–35.7)
MCV: 80 fL (ref 79–97)
Monocytes Absolute: 0.4 10*3/uL (ref 0.1–0.9)
Monocytes: 10 %
Neutrophils Absolute: 2.5 10*3/uL (ref 1.4–7.0)
Neutrophils: 60 %
Platelets: 274 10*3/uL (ref 150–450)
RBC: 5.13 x10E6/uL (ref 3.77–5.28)
RDW: 15 % (ref 11.7–15.4)
WBC: 4.1 10*3/uL (ref 3.4–10.8)

## 2021-04-19 LAB — HEMOGLOBIN A1C
Est. average glucose Bld gHb Est-mCnc: 235 mg/dL
Hgb A1c MFr Bld: 9.8 % — ABNORMAL HIGH (ref 4.8–5.6)

## 2021-04-19 LAB — COMPREHENSIVE METABOLIC PANEL
ALT: 12 IU/L (ref 0–32)
AST: 14 IU/L (ref 0–40)
Albumin/Globulin Ratio: 2 (ref 1.2–2.2)
Albumin: 4.4 g/dL (ref 3.6–4.6)
Alkaline Phosphatase: 112 IU/L (ref 44–121)
BUN/Creatinine Ratio: 12 (ref 12–28)
BUN: 11 mg/dL (ref 8–27)
Bilirubin Total: 0.3 mg/dL (ref 0.0–1.2)
CO2: 27 mmol/L (ref 20–29)
Calcium: 9.4 mg/dL (ref 8.7–10.3)
Chloride: 100 mmol/L (ref 96–106)
Creatinine, Ser: 0.93 mg/dL (ref 0.57–1.00)
Globulin, Total: 2.2 g/dL (ref 1.5–4.5)
Glucose: 247 mg/dL — ABNORMAL HIGH (ref 70–99)
Potassium: 4.7 mmol/L (ref 3.5–5.2)
Sodium: 139 mmol/L (ref 134–144)
Total Protein: 6.6 g/dL (ref 6.0–8.5)
eGFR: 61 mL/min/{1.73_m2} (ref >59)

## 2021-04-19 LAB — TSH: TSH: 1.87 u[IU]/mL (ref 0.450–4.500)

## 2021-04-19 LAB — VITAMIN B12: Vitamin B-12: 432 pg/mL (ref 232–1245)

## 2021-04-19 NOTE — Telephone Encounter (Signed)
Contacted pt, spoke to daughter per DPR, informed her labs are normal, A1c is elevated, advised to contact PCP for management. Daughter was appreciative.

## 2021-04-19 NOTE — Telephone Encounter (Signed)
-----   Message from Ocie Doyne, MD sent at 04/19/2021  8:16 AM EST ----- A1c is high. Otherwise her blood work is normal. She can talk to her PCP about further blood sugar management

## 2021-04-25 ENCOUNTER — Telehealth: Payer: Self-pay | Admitting: Psychiatry

## 2021-04-25 NOTE — Telephone Encounter (Signed)
Pt's daughter Serina Cowper called wanting to know does her mom still need to take the donepezil (ARICEPT) 5 MG tablet. Requesting a call back.

## 2021-04-25 NOTE — Telephone Encounter (Signed)
Contacted daughter back, informed her dr Delena Bali recommends her to continue the Aricept if she isn't having any issues or side affects with it, as she denied, in addition to [place]. Daughter was appreciative

## 2021-05-01 ENCOUNTER — Telehealth: Payer: Self-pay | Admitting: Psychiatry

## 2021-05-01 NOTE — Telephone Encounter (Signed)
She can stop the Aricept and continue on just the Namenda for now

## 2021-05-01 NOTE — Telephone Encounter (Signed)
Pt's daughter, Clarene Reamer (on [place]) called, last week she was dizzy, this week she falling constantly, disoriented. My father will not let her take the medication Saturday, Sunday or today. Would like a call from the nurse.
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
## 2021-05-01 NOTE — Telephone Encounter (Signed)
Pt's daughter notified and verbalized understanding and agreement to this plan.

## 2021-05-10 ENCOUNTER — Telehealth: Payer: Self-pay | Admitting: Psychiatry

## 2021-05-10 ENCOUNTER — Other Ambulatory Visit: Payer: Self-pay | Admitting: Psychiatry

## 2021-05-10 ENCOUNTER — Other Ambulatory Visit: Payer: Self-pay

## 2021-05-10 ENCOUNTER — Ambulatory Visit
Admission: RE | Admit: 2021-05-10 | Discharge: 2021-05-10 | Disposition: A | Discharge status: Home / Self Care | Payer: Medicare Other | Source: Ambulatory Visit | Attending: Psychiatry | Admitting: Psychiatry

## 2021-05-10 DIAGNOSIS — R413 Other amnesia: Secondary | ICD-10-CM

## 2021-05-10 DIAGNOSIS — E785 Hyperlipidemia, unspecified: Secondary | ICD-10-CM

## 2021-05-10 DIAGNOSIS — I639 Cerebral infarction, unspecified: Secondary | ICD-10-CM

## 2021-05-10 DIAGNOSIS — E1169 Type 2 diabetes mellitus with other specified complication: Secondary | ICD-10-CM

## 2021-05-10 DIAGNOSIS — I63513 Cerebral infarction due to unspecified occlusion or stenosis of bilateral middle cerebral arteries: Secondary | ICD-10-CM

## 2021-05-10 DIAGNOSIS — E119 Type 2 diabetes mellitus without complications: Secondary | ICD-10-CM

## 2021-05-10 MED ORDER — CLOPIDOGREL BISULFATE 75 MG PO TABS: 75.0000 mg | ORAL_TABLET | Freq: Every day | ORAL | 2 refills | Status: AC

## 2021-05-10 MED ORDER — GADOBENATE DIMEGLUMINE 529 MG/ML IV SOLN
12.0000 mL | Freq: Once | INTRAVENOUS | Status: AC | PRN
  Administered 2021-05-10: 10:00:00 12 mL via INTRAVENOUS

## 2021-05-10 NOTE — Telephone Encounter (Signed)
Pt's daughter is asking to be called on her cell phone #[phone] with results to MRI for pt

## 2021-05-10 NOTE — Telephone Encounter (Signed)
Tina [name] with [facility] called stating there was an abnormality with pt during her MRI of a stroke. [name] is wanting to give doctor a heads up to call and see if pt needs to go to ED. [name] is requesting a call back.

## 2021-05-10 NOTE — Progress Notes (Addendum)
Called patient and daughter to discuss late acute stroke found on MRI. Patient is asymptomatic without focal neurologic deficits. Discussed diagnosis of stroke and stroke workup. Rx for Plavix sent to her pharmacy. Will plan for ASA + plavix for now, duration of DAPT will be determined after workup is complete. Counseled on side effects including increased risk of bleeding and bruising. All questions answered.  Tina [name] Mika Griffitts 05/10/21 12:23 PM

## 2021-05-10 NOTE — Telephone Encounter (Signed)
[facility], stated abnormality on her diffusion, possible stroke. She asked for MD to review. Dr Marjory Lies reviewed MRI brain images, advised that patient may go home with family. Her daughter is with her. Tina [name] will send patient home with her daughter.  Dr Marjory Lies will notify Dr Delena Bali of results.  Dr Delena Bali will call family with MRI results and next steps.

## 2021-05-10 NOTE — Telephone Encounter (Signed)
Medicare/[employer] supp order sent to GI, [facility] they will reach out to the patient to schedule.  

## 2021-05-11 ENCOUNTER — Other Ambulatory Visit (INDEPENDENT_AMBULATORY_CARE_PROVIDER_SITE_OTHER): Payer: Self-pay

## 2021-05-11 DIAGNOSIS — Z0289 Encounter for other administrative examinations: Secondary | ICD-10-CM

## 2021-05-11 DIAGNOSIS — E119 Type 2 diabetes mellitus without complications: Secondary | ICD-10-CM

## 2021-05-11 DIAGNOSIS — E785 Hyperlipidemia, unspecified: Secondary | ICD-10-CM

## 2021-05-12 ENCOUNTER — Other Ambulatory Visit: Payer: Self-pay | Admitting: Psychiatry

## 2021-05-12 LAB — LIPID PANEL
Chol/HDL Ratio: 2.4 ratio (ref 0.0–4.4)
Cholesterol, Total: 203 mg/dL — ABNORMAL HIGH (ref 100–199)
HDL: 83 mg/dL (ref >39)
LDL Chol Calc (NIH): 103 mg/dL — ABNORMAL HIGH (ref 0–99)
Triglycerides: 95 mg/dL (ref 0–149)
VLDL Cholesterol Cal: 17 mg/dL (ref 5–40)

## 2021-05-12 LAB — HEMOGLOBIN A1C
Est. average glucose Bld gHb Est-mCnc: 217 mg/dL
Hgb A1c MFr Bld: 9.2 % — ABNORMAL HIGH (ref 4.8–5.6)

## 2021-05-12 MED ORDER — EZETIMIBE 10 MG PO TABS: 10.0000 mg | ORAL_TABLET | Freq: Every day | ORAL | 2 refills | Status: AC

## 2021-05-13 ENCOUNTER — Telehealth: Payer: Self-pay | Admitting: Neurology

## 2021-05-13 ENCOUNTER — Emergency Department (HOSPITAL_BASED_OUTPATIENT_CLINIC_OR_DEPARTMENT_OTHER): Payer: Medicare Other

## 2021-05-13 ENCOUNTER — Inpatient Hospital Stay (HOSPITAL_BASED_OUTPATIENT_CLINIC_OR_DEPARTMENT_OTHER)
Admission: EM | Admit: 2021-05-13 | Discharge: 2021-05-31 | DRG: 040 | Disposition: A | Discharge status: Skilled Nursing Facility | Payer: Medicare Other | Attending: Internal Medicine | Admitting: Internal Medicine

## 2021-05-13 ENCOUNTER — Other Ambulatory Visit: Payer: Self-pay

## 2021-05-13 ENCOUNTER — Encounter (HOSPITAL_BASED_OUTPATIENT_CLINIC_OR_DEPARTMENT_OTHER): Payer: Self-pay | Admitting: Emergency Medicine

## 2021-05-13 DIAGNOSIS — E1169 Type 2 diabetes mellitus with other specified complication: Secondary | ICD-10-CM | POA: Diagnosis present

## 2021-05-13 DIAGNOSIS — R319 Hematuria, unspecified: Secondary | ICD-10-CM | POA: Diagnosis not present

## 2021-05-13 DIAGNOSIS — Z7984 Long term (current) use of oral hypoglycemic drugs: Secondary | ICD-10-CM

## 2021-05-13 DIAGNOSIS — F03918 Unspecified dementia, unspecified severity, with other behavioral disturbance: Secondary | ICD-10-CM | POA: Diagnosis not present

## 2021-05-13 DIAGNOSIS — R471 Dysarthria and anarthria: Secondary | ICD-10-CM | POA: Diagnosis not present

## 2021-05-13 DIAGNOSIS — E782 Mixed hyperlipidemia: Secondary | ICD-10-CM

## 2021-05-13 DIAGNOSIS — I959 Hypotension, unspecified: Secondary | ICD-10-CM | POA: Diagnosis not present

## 2021-05-13 DIAGNOSIS — I639 Cerebral infarction, unspecified: Secondary | ICD-10-CM | POA: Diagnosis not present

## 2021-05-13 DIAGNOSIS — G8324 Monoplegia of upper limb affecting left nondominant side: Secondary | ICD-10-CM | POA: Diagnosis not present

## 2021-05-13 DIAGNOSIS — U071 COVID-19: Secondary | ICD-10-CM | POA: Diagnosis not present

## 2021-05-13 DIAGNOSIS — Z20822 Contact with and (suspected) exposure to covid-19: Secondary | ICD-10-CM | POA: Diagnosis not present

## 2021-05-13 DIAGNOSIS — I63521 Cerebral infarction due to unspecified occlusion or stenosis of right anterior cerebral artery: Secondary | ICD-10-CM | POA: Diagnosis not present

## 2021-05-13 DIAGNOSIS — N390 Urinary tract infection, site not specified: Secondary | ICD-10-CM | POA: Diagnosis not present

## 2021-05-13 DIAGNOSIS — E039 Hypothyroidism, unspecified: Secondary | ICD-10-CM | POA: Diagnosis not present

## 2021-05-13 DIAGNOSIS — Z8673 Personal history of transient ischemic attack (TIA), and cerebral infarction without residual deficits: Secondary | ICD-10-CM

## 2021-05-13 DIAGNOSIS — F419 Anxiety disorder, unspecified: Secondary | ICD-10-CM | POA: Diagnosis present

## 2021-05-13 DIAGNOSIS — G8191 Hemiplegia, unspecified affecting right dominant side: Secondary | ICD-10-CM | POA: Diagnosis not present

## 2021-05-13 DIAGNOSIS — I421 Obstructive hypertrophic cardiomyopathy: Secondary | ICD-10-CM | POA: Diagnosis present

## 2021-05-13 DIAGNOSIS — Z90710 Acquired absence of both cervix and uterus: Secondary | ICD-10-CM

## 2021-05-13 DIAGNOSIS — R296 Repeated falls: Secondary | ICD-10-CM | POA: Diagnosis present

## 2021-05-13 DIAGNOSIS — Z79890 Hormone replacement therapy (postmenopausal): Secondary | ICD-10-CM

## 2021-05-13 DIAGNOSIS — I2510 Atherosclerotic heart disease of native coronary artery without angina pectoris: Secondary | ICD-10-CM | POA: Diagnosis present

## 2021-05-13 DIAGNOSIS — R4182 Altered mental status, unspecified: Secondary | ICD-10-CM | POA: Diagnosis not present

## 2021-05-13 DIAGNOSIS — E1165 Type 2 diabetes mellitus with hyperglycemia: Secondary | ICD-10-CM | POA: Diagnosis present

## 2021-05-13 DIAGNOSIS — I110 Hypertensive heart disease with heart failure: Secondary | ICD-10-CM | POA: Diagnosis not present

## 2021-05-13 DIAGNOSIS — E1151 Type 2 diabetes mellitus with diabetic peripheral angiopathy without gangrene: Secondary | ICD-10-CM | POA: Diagnosis not present

## 2021-05-13 DIAGNOSIS — I5032 Chronic diastolic (congestive) heart failure: Secondary | ICD-10-CM | POA: Diagnosis not present

## 2021-05-13 DIAGNOSIS — E11649 Type 2 diabetes mellitus with hypoglycemia without coma: Secondary | ICD-10-CM | POA: Diagnosis not present

## 2021-05-13 DIAGNOSIS — Z79899 Other long term (current) drug therapy: Secondary | ICD-10-CM

## 2021-05-13 DIAGNOSIS — Z7982 Long term (current) use of aspirin: Secondary | ICD-10-CM

## 2021-05-13 DIAGNOSIS — F05 Delirium due to known physiological condition: Secondary | ICD-10-CM | POA: Diagnosis not present

## 2021-05-13 DIAGNOSIS — Z8249 Family history of ischemic heart disease and other diseases of the circulatory system: Secondary | ICD-10-CM

## 2021-05-13 DIAGNOSIS — I10 Essential (primary) hypertension: Secondary | ICD-10-CM | POA: Diagnosis not present

## 2021-05-13 DIAGNOSIS — R413 Other amnesia: Secondary | ICD-10-CM | POA: Diagnosis present

## 2021-05-13 DIAGNOSIS — F0390 Unspecified dementia without behavioral disturbance: Secondary | ICD-10-CM

## 2021-05-13 DIAGNOSIS — E876 Hypokalemia: Secondary | ICD-10-CM | POA: Diagnosis present

## 2021-05-13 DIAGNOSIS — Z961 Presence of intraocular lens: Secondary | ICD-10-CM | POA: Diagnosis present

## 2021-05-13 DIAGNOSIS — G9341 Metabolic encephalopathy: Secondary | ICD-10-CM

## 2021-05-13 DIAGNOSIS — I4891 Unspecified atrial fibrillation: Secondary | ICD-10-CM | POA: Diagnosis not present

## 2021-05-13 DIAGNOSIS — G8314 Monoplegia of lower limb affecting left nondominant side: Secondary | ICD-10-CM | POA: Diagnosis not present

## 2021-05-13 DIAGNOSIS — Z7902 Long term (current) use of antithrombotics/antiplatelets: Secondary | ICD-10-CM

## 2021-05-13 DIAGNOSIS — Z888 Allergy status to other drugs, medicaments and biological substances status: Secondary | ICD-10-CM

## 2021-05-13 DIAGNOSIS — Z9114 Patient's other noncompliance with medication regimen: Secondary | ICD-10-CM

## 2021-05-13 LAB — CBC WITH DIFFERENTIAL/PLATELET
Abs Immature Granulocytes: 0 10*3/uL (ref 0.00–0.07)
Basophils Absolute: 0 10*3/uL (ref 0.0–0.1)
Basophils Relative: 0 %
Eosinophils Absolute: 0.2 10*3/uL (ref 0.0–0.5)
Eosinophils Relative: 3 %
HCT: 39 % (ref 36.0–46.0)
Hemoglobin: 12.2 g/dL (ref 12.0–15.0)
Immature Granulocytes: 0 %
Lymphocytes Relative: 23 %
Lymphs Abs: 1 10*3/uL (ref 0.7–4.0)
MCH: 25.1 pg — ABNORMAL LOW (ref 26.0–34.0)
MCHC: 31.3 g/dL (ref 30.0–36.0)
MCV: 80.2 fL (ref 80.0–100.0)
Monocytes Absolute: 0.5 10*3/uL (ref 0.1–1.0)
Monocytes Relative: 11 %
Neutro Abs: 2.7 10*3/uL (ref 1.7–7.7)
Neutrophils Relative %: 63 %
Platelets: 278 10*3/uL (ref 150–400)
RBC: 4.86 MIL/uL (ref 3.87–5.11)
RDW: 16.2 % — ABNORMAL HIGH (ref 11.5–15.5)
WBC: 4.4 10*3/uL (ref 4.0–10.5)
nRBC: 0 % (ref 0.0–0.2)

## 2021-05-13 LAB — COMPREHENSIVE METABOLIC PANEL
ALT: 11 U/L (ref 0–44)
AST: 12 U/L — ABNORMAL LOW (ref 15–41)
Albumin: 3.9 g/dL (ref 3.5–5.0)
Alkaline Phosphatase: 120 U/L (ref 38–126)
Anion gap: 4 — ABNORMAL LOW (ref 5–15)
BUN: 20 mg/dL (ref 8–23)
CO2: 31 mmol/L (ref 22–32)
Calcium: 8.9 mg/dL (ref 8.9–10.3)
Chloride: 103 mmol/L (ref 98–111)
Creatinine, Ser: 0.97 mg/dL (ref 0.44–1.00)
GFR, Estimated: 58 mL/min — ABNORMAL LOW (ref >60)
Glucose, Bld: 139 mg/dL — ABNORMAL HIGH (ref 70–99)
Potassium: 4.3 mmol/L (ref 3.5–5.1)
Sodium: 138 mmol/L (ref 135–145)
Total Bilirubin: 0.3 mg/dL (ref 0.3–1.2)
Total Protein: 6.6 g/dL (ref 6.5–8.1)

## 2021-05-13 LAB — URINALYSIS, ROUTINE W REFLEX MICROSCOPIC
Bilirubin Urine: NEGATIVE
Glucose, UA: 100 mg/dL — AB
Hgb urine dipstick: NEGATIVE
Ketones, ur: NEGATIVE mg/dL
Nitrite: NEGATIVE
Protein, ur: NEGATIVE mg/dL
Specific Gravity, Urine: 1.013 (ref 1.005–1.030)
pH: 6 (ref 5.0–8.0)

## 2021-05-13 LAB — MAGNESIUM: Magnesium: 1.9 mg/dL (ref 1.7–2.4)

## 2021-05-13 LAB — RESP PANEL BY RT-PCR (FLU A&B, COVID) ARPGX2
Influenza A by PCR: NEGATIVE
Influenza B by PCR: NEGATIVE
SARS Coronavirus 2 by RT PCR: NEGATIVE

## 2021-05-13 LAB — CBG MONITORING, ED
Glucose-Capillary: 197 mg/dL — ABNORMAL HIGH (ref 70–99)
Glucose-Capillary: 61 mg/dL — ABNORMAL LOW (ref 70–99)
Glucose-Capillary: 66 mg/dL — ABNORMAL LOW (ref 70–99)
Glucose-Capillary: 165 mg/dL — ABNORMAL HIGH (ref 70–99)

## 2021-05-13 LAB — PROTIME-INR
INR: 1 (ref 0.8–1.2)
Prothrombin Time: 12.9 seconds (ref 11.4–15.2)

## 2021-05-13 LAB — AMMONIA: Ammonia: 31 umol/L (ref 9–35)

## 2021-05-13 MED ORDER — IOHEXOL 350 MG/ML SOLN
100.0000 mL | Freq: Once | INTRAVENOUS | Status: AC | PRN
  Administered 2021-05-13: 75 mL via INTRAVENOUS

## 2021-05-13 MED ORDER — ACETAMINOPHEN 325 MG PO TABS
650.0000 mg | ORAL_TABLET | Freq: Four times a day (QID) | ORAL | Status: DC | PRN
  Administered 2021-05-14 – 2021-05-30 (×23): 650 mg via ORAL
  Filled 2021-05-13 (×23): qty 2

## 2021-05-13 MED ORDER — ACETAMINOPHEN 650 MG RE SUPP: 650.0000 mg | Freq: Four times a day (QID) | RECTAL | Status: DC | PRN

## 2021-05-13 MED ORDER — LOSARTAN POTASSIUM 50 MG PO TABS
100.0000 mg | ORAL_TABLET | Freq: Every day | ORAL | Status: DC
  Administered 2021-05-13 – 2021-05-23 (×11): 100 mg via ORAL
  Filled 2021-05-13 (×11): qty 2

## 2021-05-13 MED ORDER — POLYETHYLENE GLYCOL 3350 17 G PO PACK
17.0000 g | PACK | Freq: Every day | ORAL | Status: DC | PRN
  Administered 2021-05-17 – 2021-05-18 (×2): 17 g via ORAL
  Filled 2021-05-13 (×2): qty 1

## 2021-05-13 MED ORDER — HYDRALAZINE HCL 20 MG/ML IJ SOLN
10.0000 mg | Freq: Four times a day (QID) | INTRAMUSCULAR | Status: DC | PRN
  Administered 2021-05-13 – 2021-05-15 (×2): 10 mg via INTRAVENOUS
  Filled 2021-05-13 (×2): qty 1

## 2021-05-13 MED ORDER — ASPIRIN EC 81 MG PO TBEC
81.0000 mg | DELAYED_RELEASE_TABLET | Freq: Every morning | ORAL | Status: DC
  Administered 2021-05-14 – 2021-05-18 (×5): 81 mg via ORAL
  Filled 2021-05-13 (×6): qty 1

## 2021-05-13 MED ORDER — ENOXAPARIN SODIUM 40 MG/0.4ML IJ SOSY
40.0000 mg | PREFILLED_SYRINGE | INTRAMUSCULAR | Status: DC
  Administered 2021-05-13 – 2021-05-23 (×11): 40 mg via SUBCUTANEOUS
  Filled 2021-05-13 (×11): qty 0.4

## 2021-05-13 MED ORDER — ADULT MULTIVITAMIN W/MINERALS CH
1.0000 | ORAL_TABLET | Freq: Every day | ORAL | Status: DC
  Administered 2021-05-14 – 2021-05-31 (×17): 1 via ORAL
  Filled 2021-05-13 (×17): qty 1

## 2021-05-13 MED ORDER — LEVOTHYROXINE SODIUM 100 MCG PO TABS
100.0000 ug | ORAL_TABLET | Freq: Every day | ORAL | Status: DC
  Administered 2021-05-14 – 2021-05-31 (×18): 100 ug via ORAL
  Filled 2021-05-13 (×18): qty 1

## 2021-05-13 MED ORDER — STROKE: EARLY STAGES OF RECOVERY BOOK
Freq: Once | Status: AC
  Filled 2021-05-13: qty 1

## 2021-05-13 MED ORDER — ONDANSETRON HCL 4 MG/2ML IJ SOLN
4.0000 mg | Freq: Four times a day (QID) | INTRAMUSCULAR | Status: DC | PRN
  Administered 2021-05-25: 13:00:00 4 mg via INTRAVENOUS
  Filled 2021-05-13: qty 2

## 2021-05-13 MED ORDER — INSULIN ASPART 100 UNIT/ML IJ SOLN: 0.0000 [IU] | INTRAMUSCULAR | Status: DC

## 2021-05-13 MED ORDER — CLOPIDOGREL BISULFATE 75 MG PO TABS
75.0000 mg | ORAL_TABLET | Freq: Every day | ORAL | Status: DC
  Administered 2021-05-14 – 2021-05-31 (×18): 75 mg via ORAL
  Filled 2021-05-13 (×18): qty 1

## 2021-05-13 MED ORDER — INSULIN ASPART 100 UNIT/ML IJ SOLN
0.0000 [IU] | Freq: Three times a day (TID) | INTRAMUSCULAR | Status: DC
  Administered 2021-05-14: 14:00:00 1 [IU] via SUBCUTANEOUS
  Administered 2021-05-15: 23:00:00 2 [IU] via SUBCUTANEOUS
  Administered 2021-05-15 – 2021-05-19 (×7): 1 [IU] via SUBCUTANEOUS

## 2021-05-13 MED ORDER — ONDANSETRON HCL 4 MG PO TABS: 4.0000 mg | ORAL_TABLET | Freq: Four times a day (QID) | ORAL | Status: DC | PRN

## 2021-05-13 NOTE — Progress Notes (Signed)
Patient arrived to unit in stable condition. Vital signs stable. Patient ambulated with nurse to bathroom. Bed left in lowest position, call bell within reach and bed alarm activated. Admitting MD paged for orders.  Melony Overly, RN

## 2021-05-13 NOTE — Assessment & Plan Note (Signed)
•   Patient is apparently not taking her home regimen of Zetia.  Obtaining lipid panel, will initiate statin therapy if LDL above 70.

## 2021-05-13 NOTE — ED Notes (Signed)
Cbg 66, treated with sprite and peanut butter crackers.

## 2021-05-13 NOTE — ED Triage Notes (Signed)
Pt recently had memory medications adjusted and has been falling more often recently. Recent MRI results showed that she had a stroke at some point during the past month.  Last night and this morning, daughter states pt "just hasn't been acting right."

## 2021-05-13 NOTE — ED Notes (Signed)
Cbg 61, patients daughter wants to go get her a meal. Doesn't want another snack so she doesn't get "too full".

## 2021-05-13 NOTE — ED Notes (Signed)
Report called to Jenique Evans,RN unit [id number]

## 2021-05-13 NOTE — H&P (Signed)
History and Physical    CARALEE MOREA LOV:[id number] DOB: 1941/03/11 DOA: 05/13/2021  PCP: Leonard Downing, MD  Patient coming from: Home via [facility]   Chief Complaint:  Chief Complaint  Patient presents with   Altered Mental Status     HPI:    80 year old female with past medical history of hypertension, diabetes mellitus type 2, anxiety disorder, hyperlipidemia, HOCM,  hypothyroidism who presented to [place] emergency department with frequent falls.  Of note, for approximately the past 2 years the patient has been experiencing progressive forgetfulness.  As a result, patient was initiated on Aricept approximately one year ago by per PCP and more recently Namenda (in November) by Dr.Chima with [facility] whom the patient was referred to for further workup in late November.  As part of the patient's outpatient work-up on 12/14 patient underwent MRI brain with and without contrast revealing a complete acute right frontal parasagittal infarct.    Family reports that in the past few weeks the patient has been experiencing frequent falls and due to concerns by family they discontinued the patient's donepezil.  Despite this, in the past 24 hours the patient has been experiencing increasing lethargy and confusion.  Patient additionally having associated difficulty with sleeping at night while being quite sleepy during the day.  Due to patient's progressively worsening symptoms the patient's daughter contacted Dr.Athar with neurology and due to patient's progressively worsening symptoms is recommended the patient present to [place] for further evaluation.  Upon evaluation at the emergency department, CT angiogram of the head and neck revealed an evolving subacute infarct in the right frontal lobe with occlusion of the right ACA.  ER provider discussed case with Dr. Cheral Marker with neurology who stated either inpatient or outpatient continued work-up was  appropriate however patient family was quite concerned and desired patient to be hospitalized for further work-up and therefore patient has been accepted for transfer to [facility] by the hospitalist group for continued work-up of the stroke as well as evaluating for other etiologies of patient's symptoms.    Review of Systems:   Review of Systems  Unable to perform ROS: Mental status change   Past Medical History:  Diagnosis Date   Carotid artery disease ([place])    a. Carotid US 7/15 - bilat ICA 40-59% >> FU 1 year //  b. Carotid US 10/17: bilat ICA 1-39% >> FU PRN (will get repeat in 2 years)    Diabetes mellitus    Heart murmur    History of nuclear stress test    a. Myoview 6/15 - Normal stress nuclear study. LV Ejection Fraction: 77%   HOCM (hypertrophic obstructive cardiomyopathy) ([place])    a. Echo 7/15 - Mild concentric LVH, vigorous LVF, EF 65-70%, LVOT dynamic obstruction at rest with peak gradient 20 mmHg, grade 1 diastolic dysfunction, mild SAM, mild to moderate MR // b. Echo 10/17: EF 55-60%, normal wall motion, grade 2 diastolic dysfunction, MAC, mild to moderate MR, mild LAE (LVOT 9 mmHg)   Hyperlipidemia    Hypertension    Hypothyroidism    Memory changes    Mitral regurgitation     Past Surgical History:  Procedure Laterality Date   ABDOMINAL HYSTERECTOMY     BRAIN SURGERY     BREAST SURGERY     tumors right breast    CARDIOVASCULAR STRESS TEST  03/06/2007   EF 86%   COLONOSCOPY     GOITER REMOVAL  1972   SINUS  EXPLORATION     US ECHOCARDIOGRAPHY  10/10/2006   EF 55-60%     reports that she has never smoked. She has never used smokeless tobacco. She reports that she does not drink alcohol and does not use drugs.  Allergies  Allergen Reactions   Carvedilol Other (See Comments)    Dizziness   Lipitor [Atorvastatin Calcium] Other (See Comments)    Pt doesn't recall   Loratadine Other (See Comments)    Unknown reaction   Metformin And Related Other  (See Comments)    Unknown reaction   Pravachol Other (See Comments)    Unknown reaction   Zocor [Simvastatin] Other (See Comments)    Unknown reaction   Metoprolol Other (See Comments)    Pt states metoprolol made her "very tired."    Family History  Problem Relation Age of Onset   Heart disease Mother    Healthy Father    Breast cancer Neg Hx      Prior to Admission medications   Medication Sig Start Date End Date Taking? Authorizing Provider  aspirin EC 81 MG tablet Take 81 mg by mouth every morning. Swallow whole.   Yes [provider]  clopidogrel (PLAVIX) 75 MG tablet Take 1 tablet (75 mg total) by mouth daily. 05/10/21  Yes Genia Harold, MD  glipiZIDE (GLUCOTROL XL) 10 MG 24 hr tablet Take 20 mg by mouth every morning. 04/20/20  Yes [provider]  losartan (COZAAR) 100 MG tablet Take 100 mg by mouth at bedtime.   Yes [provider]  pioglitazone (ACTOS) 30 MG tablet Take 30 mg by mouth every morning. 05/09/21  Yes [provider]  SYNTHROID 100 MCG tablet Take 1 tablet (100 mcg total) by mouth daily. Patient taking differently: Take 100 mcg by mouth every morning. 08/10/16 06/27/21 Yes Dorothy Spark, MD  donepezil (ARICEPT) 10 MG tablet Take 10 mg by mouth 2 (two) times daily. Patient not taking: Reported on 05/13/2021 02/15/21   [provider]  ezetimibe (ZETIA) 10 MG tablet Take 1 tablet (10 mg total) by mouth daily. Patient not taking: Reported on 05/13/2021 05/12/21   Genia Harold, MD  memantine (NAMENDA) 10 MG tablet Take 1/2 tab at night for one week. Then take 1/2 tab in the morning and night for one week. Then take 1/2 tab in the morning and 1 tab at night for one week. Then take 1 tab twice a day Patient not taking: Reported on 05/13/2021 04/18/21   Genia Harold, MD    Physical Exam: Vitals:   05/13/21 1630 05/13/21 1825 05/13/21 1941 05/13/21 2222  BP: (!) 162/54 (!) 158/62 (!) 172/63 (!) 189/67  Pulse:   77 78 63  Resp: _0 Temp:  98.2 F (36.8 C) 98.1 F (36.7 C) 97.6 F (36.4 C)  TempSrc:  Oral Oral Oral  SpO2:  100% 100% 100%  Weight:   61 kg   Height:   _1  (1.575 m)     Constitutional: Lethargic but arousable and oriented x2.  No associated distress.   Skin: no rashes, no lesions, slightly poor skin turgor noted. Eyes: Pupils are equally reactive to light.  No evidence of scleral icterus or conjunctival pallor.  ENMT: Moist mucous membranes noted.  Posterior pharynx clear of any exudate or lesions.   Neck: normal, supple, no masses, no thyromegaly.  No evidence of jugular venous distension.   Respiratory: clear to auscultation bilaterally, no wheezing, no crackles. Normal respiratory effort. No  accessory muscle use.  Cardiovascular: Regular rate and rhythm, no murmurs / rubs / gallops. No extremity edema. 2+ pedal pulses. No carotid bruits.  Chest:   Nontender without crepitus or deformity.   Back:   Nontender without crepitus or deformity. Abdomen: Abdomen is soft and nontender.  No evidence of intra-abdominal masses.  Positive bowel sounds noted in all quadrants.   Musculoskeletal: No joint deformity upper and lower extremities. Good ROM, no contractures. Normal muscle tone.  Neurologic: CN 2-12 grossly intact. Sensation intact.  Patient moving all 4 extremities spontaneously.  Patient is following all commands.  Patient is responsive to verbal stimuli.   Psychiatric: Patient exhibits normal mood with flat affect.  Patient does not seem to possess insight as to their current situation.     Labs on Admission: I have personally reviewed following labs and imaging studies -   CBC: Recent Labs  Lab 05/13/21 1136  WBC 4.4  NEUTROABS 2.7  HGB 12.2  HCT 39.0  MCV 80.2  PLT 093   Basic Metabolic Panel: Recent Labs  Lab 05/13/21 1136  NA 138  K 4.3  CL 103  CO2 31  GLUCOSE 139*  BUN 20  CREATININE 0.97  CALCIUM 8.9  MG 1.9   GFR: Estimated Creatinine  Clearance: 38.5 mL/min (by C-G formula based on SCr of 0.97 mg/dL). Liver Function Tests: Recent Labs  Lab 05/13/21 1136  AST 12*  ALT 11  ALKPHOS 120  BILITOT 0.3  PROT 6.6  ALBUMIN 3.9   No results for input(s): LIPASE, AMYLASE in the last 168 hours. Recent Labs  Lab 05/13/21 1136  AMMONIA 31   Coagulation Profile: Recent Labs  Lab 05/13/21 1136  INR 1.0   Cardiac Enzymes: No results for input(s): CKTOTAL, CKMB, CKMBINDEX, TROPONINI in the last 168 hours. BNP (last 3 results) No results for input(s): PROBNP in the last 8760 hours. HbA1C: Recent Labs    05/11/21 1104  HGBA1C 9.2*   CBG: Recent Labs  Lab 05/13/21 1115 05/13/21 1433 05/13/21 1510 05/13/21 1622  GLUCAP 197* 66* 61* 165*   Lipid Profile: Recent Labs    05/11/21 1104  CHOL 203*  HDL 83  LDLCALC 103*  TRIG 95  CHOLHDL 2.4   Thyroid Function Tests: No results for input(s): TSH, T4TOTAL, FREET4, T3FREE, THYROIDAB in the last 72 hours. Anemia Panel: No results for input(s): VITAMINB12, FOLATE, FERRITIN, TIBC, IRON, RETICCTPCT in the last 72 hours. Urine analysis:    Component Value Date/Time   COLORURINE YELLOW 05/13/2021 1116   APPEARANCEUR CLEAR 05/13/2021 1116   LABSPEC 1.013 05/13/2021 1116   PHURINE 6.0 05/13/2021 1116   GLUCOSEU 100 (A) 05/13/2021 1116   HGBUR NEGATIVE 05/13/2021 1116   [place] 05/13/2021 1116   [place] 05/13/2021 1116   PROTEINUR NEGATIVE 05/13/2021 1116   NITRITE NEGATIVE 05/13/2021 1116   LEUKOCYTESUR MODERATE (A) 05/13/2021 1116    Radiological Exams on Admission - Personally Reviewed: CT ANGIO HEAD NECK W WO CM  Result Date: 05/13/2021 CLINICAL DATA:  Recent stroke, worsening altered mental status, concern for new stroke EXAM: CT ANGIOGRAPHY HEAD AND NECK TECHNIQUE: Multidetector CT imaging of the head and neck was performed using the standard protocol during bolus administration of intravenous contrast. Multiplanar CT image  reconstructions and MIPs were obtained to evaluate the vascular anatomy. Carotid stenosis measurements (when applicable) are obtained utilizing NASCET criteria, using the distal internal carotid diameter as the denominator. CONTRAST:  74m OMNIPAQUE IOHEXOL 350 MG/ML SOLN COMPARISON:  Brain MRI  05/10/2021 FINDINGS: CT HEAD FINDINGS Brain: There is hypodensity in the right frontal lobe white matter abutting the superior margin of the right lateral ventricle consistent with evolving subacute infarct as seen on prior MRI. There is no other evidence of acute infarct. There is no acute intracranial hemorrhage or extra-axial fluid collection. There is mild parenchymal volume loss with prominence of the extra-axial CSF spaces overlying the bilateral cerebral hemispheres. The ventricles are normal in size. There is no mass lesion.  There is no midline shift. Vascular: There is mild calcification of the intracranial ICAs. No dense vessel is seen. Skull: Normal. Negative for fracture or focal lesion. Sinuses: Postsurgical changes are seen in the sinuses. Orbits: Bilateral lens implants are in place. The globes and orbits are otherwise unremarkable. Review of the MIP images confirms the above findings CTA NECK FINDINGS Aortic arch: There is mild calcified atherosclerotic plaque in the aortic arch. The origins of the major branch vessels are patent. Right carotid system: The right common carotid artery is patent. There is calcified atherosclerotic plaque in the proximal right internal carotid artery resulting in up to approximately 40-50% stenosis. The distal right internal carotid artery is patent. The right external carotid artery is patent. There is no evidence of dissection or aneurysm. Left carotid system: The left common carotid artery is patent. There is mild calcified atherosclerotic plaque in the proximal left internal carotid artery without hemodynamically significant stenosis or occlusion. The distal left internal  carotid artery is patent. The left external carotid artery is patent. There is no dissection or aneurysm. Vertebral arteries: The vertebral arteries are patent, without hemodynamically significant stenosis, occlusion, dissection, or aneurysm. Skeleton: There is mild multilevel degenerative change in the cervical spine. There is no visible canal hematoma. There is no acute osseous abnormality or aggressive osseous lesion. Other neck: The thyroid is heterogeneous with partially calcified nodules, the largest measuring up to 1.2 cm. The soft tissues are otherwise unremarkable. Upper chest: The imaged lung apices are clear. Review of the MIP images confirms the above findings CTA HEAD FINDINGS Anterior circulation: There is calcified atherosclerotic plaque in the bilateral intracranial ICAs resulting in up to moderate stenosis of the right ophthalmic segment and moderate stenosis of the left supraclinoid segment. There is a 2 mm inferiorly directed outpouching arising from the left supraclinoid segment (15-152). No definite vessel is seen emanating from the tip of this outpouching. The bilateral A1 segments are patent. The anterior communicating artery is patent. The right A2 segment is occluded proximally without appreciable reconstitution of flow. The left ACA is patent throughout. There is an approximately 4 mm segment of moderate to severe stenosis of the left M1 segment. The distal left MCA branches are patent. The right M1 segment is patent. There is severe stenosis of an inferior M2 branch shortly after the bifurcation. The distal right MCA branches are patent. Posterior circulation: The V4 segments are patent. The basilar artery is patent. There is severe stenosis of the left P1 segment with occlusion at the level of the P1/P2 junction (12-144). There is diminutive reconstitution of flow in the distal left PCA (12-117). There is severe focal stenosis at the right P1/P2 junction just inferior to an adjacent  traversing vein (12-142). There is reconstitution of flow after the stenosis with more mild multifocal irregularity of the distal branches. Venous sinuses: As permitted by contrast timing, patent. Anatomic variants: None. Review of the MIP images confirms the above findings IMPRESSION: 1. Evolving subacute infarct in the right frontal lobe  periventricular white matter in the ACA distribution as seen on prior MRI. No evidence of new infarct or other acute intracranial pathology. 2. Occlusion of the right P2 segment shortly after the anterior communicating artery without appreciable reconstitution of flow. 3. Occlusion of the left PCA at the P1/P2 junction with diminutive reconstitution of flow distally. 4. Other intracranial atherosclerotic disease as above resulting in severe focal stenosis of the right PCA at the P1/P2 junction with reconstitution of flow distally, severe stenosis of an inferior right M2 branch, and moderate stenosis of the intracranial ICAs bilaterally. 5. 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 6. 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 These results were called by telephone at the time of interpretation on 05/13/2021 at 1:39 pm to provider [employer] , who verbally acknowledged these results. Electronically Signed   By: Valetta Mole M.D.   On: 05/13/2021 13:41    EKG: Personally reviewed.  Rhythm is sinus rhythm with notable PVC's and heart rate of 84 bpm.  No dynamic ST segment changes appreciated.  Assessment/Plan  * Acute metabolic encephalopathy Patient is exhibiting several days of confusion and lethargy, a marked change from baseline mentation and activity Presentation is complicated by known history of likely dementia over the past 1 to 2 years -patient is currently undergoing a work-up by neurology in the  outpatient setting to confirm this diagnosis While patient does have an acute stroke diagnosed on 12/14 via MRI, I am uncertain how much of this is actually contributing to the patient's current worsening confusion Patient was also identified to be hypoglycemic on multiple occasions in the emergency department.  Daughter reports that patient never checks her blood sugar and it is possible that transient hypoglycemia may be contributing to her symptoms. Patient was recently initiated on Namenda the third week of November by patient's neurologist.  This drug can on occasion actually cause confusion and therefore may also be the culprit. Urinalysis is equivocal.  Adding on urine culture but I do not feel compelled to initiate intravenous antibiotics at this time in the absence of fever abdominal pain nausea or vomiting. 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) Right frontal stroke identified on MRI with occlusion of the ACA Patient has recently been initiated on Plavix and aspirin therapy by her outpatient neurologist on 12/14, this will be continued at this time CT angiogram of the head and neck has already been performed in the emergency department Performing serial neurologic checks Monitoring patient on telemetry Daily statin therapy will be initiated if LDL is greater than 70 Obtaining hemoglobin A1c and lipid panel in the morning Echocardiogram in the morning PT, OT, SLP evaluation Permissive hypertension not required at this point considering patient is at least  several days out from the onset of the stroke. Neurology following in consultation, I have personally discussed the case with Dr. Lorrin Goodell who will come see the patient, his input is appreciated.  Mixed diabetic hyperlipidemia associated with type 2 diabetes mellitus ([place])  Patient is apparently not taking her home regimen of Zetia. Obtaining lipid panel, will initiate statin therapy if LDL above 70.   Dementia without behavioral disturbance (HCC) Approximate 2-year history of gradual cognitive decline Patient is recently been referred to outpatient neurology in late November for outpatient work-up of possible dementia Based on my assessment, I believe the patient likely has some degree of underlying dementia, particularly with the degree of atrophy of the brain noted on imaging Minimizing uncomfortable stimuli Nursing to frequently round on the patient Attempting to avoid sedating agents is much as possible  Essential hypertension Resume patients home regimen of oral antihypertensives No need for permissive hypertension as patient is at least several days out from the onset of stroke. PRN intravenous antihypertensives for excessively elevated blood pressure    Hypothyroidism Resume home regimen of Synthroid    Type 2 diabetes mellitus with hypoglycemia without coma, without long-term current use of insulin ([place]) Patient exhibited several episodes of modest hypoglycemia during her stay in the emergency department Daughter reports the patient never checks her blood sugar and is on a regular glipizide therapy Recurrent bouts of undiagnosed hypoglycemia could be causing some of the patient's symptoms Discontinuing glipizide for now Obtaining Accu-Chek on arrival to the floor, if patient continues to be hypoglycemic, will perform frequent Accu-Cheks but if hypoglycemia has resolved we will space them out somewhat Hemoglobin A1c ordered      Code Status:  Full code  code status  decision has been confirmed with: daughter Family Communication: Discussed plan of care with daughter via phone conversation.  Status is: Observation  The patient remains OBS appropriate and will d/c before 2 midnights.       Vernelle Emerald MD [facility] Pager [phone]  If 7PM-7AM, please contact night-coverage [email or web address] Use universal [place] password for that web site. If you do not have the password, please call the hospital operator.  05/13/2021, 10:23 PM

## 2021-05-13 NOTE — Telephone Encounter (Signed)
Patient's daughter, Clarene Reamer, called after-hours call service at 9:21 AM this morning regarding her mom's condition.  She requested a call back to discuss her memory medication, memantine and that patient was worse since taking this medication.  I called her back at 9:27 AM and was able to talk to her.  Patient has a history of memory loss and has recently been seen by Dr. Delena Bali, she was started on low-dose memantine with gradual titration.  She had just started taking 10 mg twice daily as per instructions.  She had stopped taking donepezil and patient was apparently on 20 mg daily of the donepezil per primary care.  Daughter said that patient had a recent fall a couple of weeks ago and was just not doing well with the combination of medications and she is stopped the Aricept.  She did not taper it off.  For the past nearly 24 hours patient has been lethargic, more confused, unable to function, unable to sleep at night and sleepy during the day.  She woke up at midnight and started cooking breakfast and made a big mess in the kitchen.  Daughter w family lives next door, [place], she checks on both parents every day and brings meals.  Daughter was worried about her condition and also reported that her recent brain MRI showed a recent stroke in the past few weeks.  I explained to the patient's daughter that her altered mental status could be from various different issues including dehydration, daughter reports that patient does not drink a whole lot of water, blood sugar fluctuation, daughter reports that she was recently started on a new diabetes drug due to an elevated A1c, seizure, for which the risk factors are donepezil use and recent stroke, and she could have had a new stroke.  For these reasons, I advised daughter to take patient to the emergency room.  She demonstrated understanding and agreement and planned to take patient to the [facility] ER. Upon chart review pt is currently in  the emergency room and had a recent scan, CT angiogram of the head and neck.  Formal report is not available for my review. I reviewed her brain MRI report from 05/10/2021, patient had a subacute right frontal lobe infarct.

## 2021-05-13 NOTE — ED Provider Notes (Signed)
[facility] DEPT Provider Note   CSN: [id number] Arrival date & time: 05/13/21  1055     History Chief Complaint  Patient presents with   Altered Mental Status    Tina [name] is a 80 y.o. female.  Presents to ER with concern for altered mental status.  Reports over the past day or so patient has had increased lethargy, intermittent confusion.  Slept during the day yesterday and then woke up in the middle of the night and tried making food in the kitchen.  This morning seems to be doing a little bit better.  Over the past couple weeks patient has had some functional decline, having some difficulty with walking and getting around as easily.  New  Patient was referred to neurology due to memory loss, neurologist obtained MRI on 12/14 which demonstrated acute right frontal parasagittal infarct.  Per telephone note from neurology, felt to be late acute stroke, patient without neurologic deficits, started on aspirin and Plavix and work-up ordered for outpatient stroke work-up.  Patient is able to answer basic questions appropriately, she denies any acute complaints at present.   HPI     Past Medical History:  Diagnosis Date   Carotid artery disease ([place])    a. Carotid US 7/15 - bilat ICA 40-59% >> FU 1 year //  b. Carotid US 10/17: bilat ICA 1-39% >> FU PRN (will get repeat in 2 years)    Diabetes mellitus    Heart murmur    History of nuclear stress test    a. Myoview 6/15 - Normal stress nuclear study. LV Ejection Fraction: 77%   HOCM (hypertrophic obstructive cardiomyopathy) ([place])    a. Echo 7/15 - Mild concentric LVH, vigorous LVF, EF 65-70%, LVOT dynamic obstruction at rest with peak gradient 20 mmHg, grade 1 diastolic dysfunction, mild SAM, mild to moderate MR // b. Echo 10/17: EF 55-60%, normal wall motion, grade 2 diastolic dysfunction, MAC, mild to moderate MR, mild LAE (LVOT 9 mmHg)   Hyperlipidemia    Hypertension    Hypothyroidism    Memory  changes    Mitral regurgitation     Patient Active Problem List   Diagnosis Date Noted   Stroke (cerebrum) ([place]) 05/13/2021   Mixed hyperlipidemia 04/03/2018   Right carotid bruit 12/16/2013   Hypertensive urgency 07/01/2012   Chest pain 07/01/2012   Dyslipidemia 01/10/2012   Benign hypertensive heart disease without heart failure 12/11/2010   Hypothyroidism 12/11/2010   Aortic valve disease 12/11/2010   Sinusitis acute 12/11/2010   Diabetes ([place]) 04/17/2010   HEMORRHOIDS-EXTERNAL 04/17/2010    Past Surgical History:  Procedure Laterality Date   ABDOMINAL HYSTERECTOMY     BRAIN SURGERY     BREAST SURGERY     tumors right breast    CARDIOVASCULAR STRESS TEST  03/06/2007   EF 86%   COLONOSCOPY     GOITER REMOVAL  1972   SINUS EXPLORATION     US ECHOCARDIOGRAPHY  10/10/2006   EF 55-60%     OB History   No obstetric history on file.     Family History  Problem Relation Age of Onset   Heart disease Mother    Healthy Father    Breast cancer Neg Hx     Social History   Tobacco Use   Smoking status: Never   Smokeless tobacco: Never  Vaping Use   Vaping Use: Never used  Substance Use Topics   Alcohol use: No   Drug use: No  Home Medications Prior to Admission medications   Medication Sig Start Date End Date Taking? Authorizing Provider  aspirin 81 MG tablet Take 81 mg by mouth daily.      [provider]  clopidogrel (PLAVIX) 75 MG tablet Take 1 tablet (75 mg total) by mouth daily. 05/10/21   Genia Harold, MD  donepezil (ARICEPT) 5 MG tablet Take 5 mg by mouth daily. 04/18/20   [provider]  ezetimibe (ZETIA) 10 MG tablet Take 1 tablet (10 mg total) by mouth daily. 05/12/21   Genia Harold, MD  glipiZIDE (GLUCOTROL XL) 10 MG 24 hr tablet Take 10 mg by mouth daily. 04/20/20   [provider]  memantine (NAMENDA) 10 MG tablet Take 1/2 tab at night for one week. Then take 1/2 tab in the morning and night for one week. Then take  1/2 tab in the morning and 1 tab at night for one week. Then take 1 tab twice a day 04/18/21   Genia Harold, MD  SYNTHROID 100 MCG tablet Take 1 tablet (100 mcg total) by mouth daily. 08/10/16 04/25/20  Dorothy Spark, MD    Allergies    Carvedilol, Lipitor [atorvastatin calcium], Loratadine, Metformin and related, Pravachol, Zocor [simvastatin], and Metoprolol  Review of Systems   Review of Systems  Constitutional:  Negative for chills and fever.  HENT:  Negative for ear pain and sore throat.   Eyes:  Negative for pain and visual disturbance.  Respiratory:  Negative for cough and shortness of breath.   Cardiovascular:  Negative for chest pain and palpitations.  Gastrointestinal:  Negative for abdominal pain and vomiting.  Genitourinary:  Negative for dysuria and hematuria.  Musculoskeletal:  Negative for arthralgias and back pain.  Skin:  Negative for color change and rash.  Neurological:  Positive for weakness and light-headedness. Negative for seizures and syncope.  Psychiatric/Behavioral:  Positive for confusion.   All other systems reviewed and are negative.  Physical Exam Updated Vital Signs BP (!) 139/57    Pulse 65    Temp (!) 97.5 F (36.4 C) (Tympanic)    Resp 18    SpO2 100%   Physical Exam Vitals and nursing note reviewed.  Constitutional:      General: She is not in acute distress.    Appearance: She is well-developed.  HENT:     Head: Normocephalic and atraumatic.  Eyes:     Conjunctiva/sclera: Conjunctivae normal.  Cardiovascular:     Rate and Rhythm: Normal rate and regular rhythm.     Heart sounds: No murmur heard. Pulmonary:     Effort: Pulmonary effort is normal. No respiratory distress.     Breath sounds: Normal breath sounds.  Abdominal:     Palpations: Abdomen is soft.     Tenderness: There is no abdominal tenderness.  Musculoskeletal:        General: No swelling.     Cervical back: Neck supple.  Skin:    General: Skin is warm and dry.      Capillary Refill: Capillary refill takes less than 2 seconds.  Neurological:     Mental Status: She is alert.     Comments: AAOx3 CN 2-12 intact, speech clear visual fields intact 5/5 strength in b/l UE and LE Sensation to light touch intact in b/l UE and LE Normal FNF  Psychiatric:        Mood and Affect: Mood normal.    ED Results / Procedures / Treatments   Labs (all labs ordered are  listed, but only abnormal results are displayed) Labs Reviewed  URINALYSIS, ROUTINE W REFLEX MICROSCOPIC - Abnormal; Notable for the following components:      Result Value   Glucose, UA 100 (*)    Leukocytes,Ua MODERATE (*)    Bacteria, UA RARE (*)    All other components within normal limits  CBC WITH DIFFERENTIAL/PLATELET - Abnormal; Notable for the following components:   MCH 25.1 (*)    RDW 16.2 (*)    All other components within normal limits  COMPREHENSIVE METABOLIC PANEL - Abnormal; Notable for the following components:   Glucose, Bld 139 (*)    AST 12 (*)    GFR, Estimated 58 (*)    Anion gap 4 (*)    All other components within normal limits  CBG MONITORING, ED - Abnormal; Notable for the following components:   Glucose-Capillary 197 (*)    All other components within normal limits  CBG MONITORING, ED - Abnormal; Notable for the following components:   Glucose-Capillary 66 (*)    All other components within normal limits  CBG MONITORING, ED - Abnormal; Notable for the following components:   Glucose-Capillary 61 (*)    All other components within normal limits  RESP PANEL BY RT-PCR (FLU A&B, COVID) ARPGX2  AMMONIA  MAGNESIUM  PROTIME-INR    EKG None  Radiology CT ANGIO HEAD NECK W WO CM  Result Date: 05/13/2021 CLINICAL DATA:  Recent stroke, worsening altered mental status, concern for new stroke EXAM: CT ANGIOGRAPHY HEAD AND NECK TECHNIQUE: Multidetector CT imaging of the head and neck was performed using the standard protocol during bolus administration of intravenous  contrast. Multiplanar CT image reconstructions and MIPs were obtained to evaluate the vascular anatomy. Carotid stenosis measurements (when applicable) are obtained utilizing NASCET criteria, using the distal internal carotid diameter as the denominator. CONTRAST:  78m OMNIPAQUE IOHEXOL 350 MG/ML SOLN COMPARISON:  Brain MRI 05/10/2021 FINDINGS: CT HEAD FINDINGS Brain: There is hypodensity in the right frontal lobe white matter abutting the superior margin of the right lateral ventricle consistent with evolving subacute infarct as seen on prior MRI. There is no other evidence of acute infarct. There is no acute intracranial hemorrhage or extra-axial fluid collection. There is mild parenchymal volume loss with prominence of the extra-axial CSF spaces overlying the bilateral cerebral hemispheres. The ventricles are normal in size. There is no mass lesion.  There is no midline shift. Vascular: There is mild calcification of the intracranial ICAs. No dense vessel is seen. Skull: Normal. Negative for fracture or focal lesion. Sinuses: Postsurgical changes are seen in the sinuses. Orbits: Bilateral lens implants are in place. The globes and orbits are otherwise unremarkable. Review of the MIP images confirms the above findings CTA NECK FINDINGS Aortic arch: There is mild calcified atherosclerotic plaque in the aortic arch. The origins of the major branch vessels are patent. Right carotid system: The right common carotid artery is patent. There is calcified atherosclerotic plaque in the proximal right internal carotid artery resulting in up to approximately 40-50% stenosis. The distal right internal carotid artery is patent. The right external carotid artery is patent. There is no evidence of dissection or aneurysm. Left carotid system: The left common carotid artery is patent. There is mild calcified atherosclerotic plaque in the proximal left internal carotid artery without hemodynamically significant stenosis or  occlusion. The distal left internal carotid artery is patent. The left external carotid artery is patent. There is no dissection or aneurysm. Vertebral arteries: The vertebral arteries  are patent, without hemodynamically significant stenosis, occlusion, dissection, or aneurysm. Skeleton: There is mild multilevel degenerative change in the cervical spine. There is no visible canal hematoma. There is no acute osseous abnormality or aggressive osseous lesion. Other neck: The thyroid is heterogeneous with partially calcified nodules, the largest measuring up to 1.2 cm. The soft tissues are otherwise unremarkable. Upper chest: The imaged lung apices are clear. Review of the MIP images confirms the above findings CTA HEAD FINDINGS Anterior circulation: There is calcified atherosclerotic plaque in the bilateral intracranial ICAs resulting in up to moderate stenosis of the right ophthalmic segment and moderate stenosis of the left supraclinoid segment. There is a 2 mm inferiorly directed outpouching arising from the left supraclinoid segment (15-152). No definite vessel is seen emanating from the tip of this outpouching. The bilateral A1 segments are patent. The anterior communicating artery is patent. The right A2 segment is occluded proximally without appreciable reconstitution of flow. The left ACA is patent throughout. There is an approximately 4 mm segment of moderate to severe stenosis of the left M1 segment. The distal left MCA branches are patent. The right M1 segment is patent. There is severe stenosis of an inferior M2 branch shortly after the bifurcation. The distal right MCA branches are patent. Posterior circulation: The V4 segments are patent. The basilar artery is patent. There is severe stenosis of the left P1 segment with occlusion at the level of the P1/P2 junction (12-144). There is diminutive reconstitution of flow in the distal left PCA (12-117). There is severe focal stenosis at the right P1/P2  junction just inferior to an adjacent traversing vein (12-142). There is reconstitution of flow after the stenosis with more mild multifocal irregularity of the distal branches. Venous sinuses: As permitted by contrast timing, patent. Anatomic variants: None. Review of the MIP images confirms the above findings IMPRESSION: 1. Evolving subacute infarct in the right frontal lobe periventricular white matter in the ACA distribution as seen on prior MRI. No evidence of new infarct or other acute intracranial pathology. 2. Occlusion of the right P2 segment shortly after the anterior communicating artery without appreciable reconstitution of flow. 3. Occlusion of the left PCA at the P1/P2 junction with diminutive reconstitution of flow distally. 4. Other intracranial atherosclerotic disease as above resulting in severe focal stenosis of the right PCA at the P1/P2 junction with reconstitution of flow distally, severe stenosis of an inferior right M2 branch, and moderate stenosis of the intracranial ICAs bilaterally. 5. 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 6. 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 These results were called by telephone at the time of interpretation on 05/13/2021 at 1:39 pm to provider [facility] , who verbally acknowledged these results. Electronically Signed   By: Valetta Mole M.D.   On: 05/13/2021 13:41    Procedures .Critical Care Performed by: Lucrezia Starch, MD Authorized by: Lucrezia Starch, MD   Critical care provider statement:    Critical care time (minutes):  35   Critical care was time spent personally by me on the following activities:  Development of treatment plan with patient or surrogate, discussions with consultants, evaluation of patient's response to treatment, examination of patient, ordering and  review of laboratory studies, ordering and review of radiographic studies, ordering and performing treatments and interventions, pulse oximetry, re-evaluation of patient's condition and review of old charts  Medications Ordered in ED Medications  iohexol (OMNIPAQUE) 350 MG/ML injection 100 mL (75 mLs Intravenous Contrast Given 05/13/21 1249)    ED Course  I have reviewed the triage vital signs and the nursing notes.  Pertinent labs & imaging results that were available during my care of the patient were reviewed by me and considered in my medical decision making (see chart for details).    MDM Rules/Calculators/A&P                         80 year old lady presenting to ER after having episode of confusion, increased general lethargy.  Recent diagnosis of stroke as an outpatient.  Currently patient does not have any focal neurologic deficit.  Appears well.  CTA demonstrated evolving subacute infarct in the right frontal lobe, occlusion of right ACA.  Discussed case with [place], he recommended either continuing stroke work-up outpatient or admitting patient for completion of stroke work-up and further monitoring.  Discussed with patient and daughter, would like to be admitted at this time.  Believe patient would benefit from getting echocardiogram, further monitoring and evaluation by PT/OT.  Discussed case with Dr. Roosevelt Locks who will admit.  Final Clinical Impression(s) / ED Diagnoses Final diagnoses:  Cerebrovascular accident (CVA) due to occlusion of right anterior cerebral artery [facility])    Rx / DC Orders ED Discharge Orders     None        Lucrezia Starch, MD 05/13/21 1622

## 2021-05-13 NOTE — Assessment & Plan Note (Signed)
·   Daughter reports several falls that occurred approximately 2 weeks ago  No focal weakness noted on my exam  I do not believe that the stroke identified on 12/14 would have contributed to these falls  Intermittent hypoglycemia or new initiation of Namenda possibly contributed  PT evaluation in the morning

## 2021-05-13 NOTE — Assessment & Plan Note (Signed)
.   Resume home regimen of Synthroid 

## 2021-05-13 NOTE — ED Notes (Signed)
Report called to [employer]. Attempted report to [facility] at 1700, nurse to return my call.

## 2021-05-13 NOTE — Assessment & Plan Note (Signed)
•   Patient is exhibiting several days of confusion and lethargy, a marked change from baseline mentation and activity  Presentation is complicated by known history of likely dementia over the past 1 to 2 years -patient is currently undergoing a work-up by neurology in the outpatient setting to confirm this diagnosis  While patient does have an acute stroke diagnosed on 12/14 via MRI, I am uncertain how much of this is actually contributing to the patient's current worsening confusion  Patient was also identified to be hypoglycemic on multiple occasions in the emergency department.  Daughter reports that patient never checks her blood sugar and it is possible that transient hypoglycemia may be contributing to her symptoms.  Patient was recently initiated on Namenda the third week of November by patient's neurologist.  This drug can on occasion actually cause confusion and therefore may also be the culprit.  Urinalysis is equivocal.  Adding on urine culture but I do not feel compelled to initiate intravenous antibiotics at this time in the absence of fever abdominal pain nausea or vomiting.  Recent vitamin B12 and TSH are unremarkable  Placing patient on gentle intravenous hydration

## 2021-05-13 NOTE — Assessment & Plan Note (Signed)
•   Resume patients home regimen of oral antihypertensives  No need for permissive hypertension as patient is at least several days out from the onset of stroke.  PRN intravenous antihypertensives for excessively elevated blood pressure

## 2021-05-13 NOTE — Assessment & Plan Note (Signed)
·   Approximate 2-year history of gradual cognitive decline  Patient is recently been referred to outpatient neurology in late November for outpatient work-up of possible dementia  Based on my assessment, I believe the patient likely has some degree of underlying dementia, particularly with the degree of atrophy of the brain noted on imaging  Minimizing uncomfortable stimuli  Nursing to frequently round on the patient  Attempting to avoid sedating agents is much as possible

## 2021-05-13 NOTE — Assessment & Plan Note (Signed)
·   Patient exhibited several episodes of modest hypoglycemia during her stay in the emergency department  Daughter reports the patient never checks her blood sugar and is on a regular glipizide therapy  Recurrent bouts of undiagnosed hypoglycemia could be causing some of the patient's symptoms  Discontinuing glipizide for now  Obtaining Accu-Chek on arrival to the floor, if patient continues to be hypoglycemic, will perform frequent Accu-Cheks but if hypoglycemia has resolved we will space them out somewhat  Hemoglobin A1c ordered

## 2021-05-13 NOTE — Consult Note (Signed)
NEUROLOGY CONSULTATION NOTE   Date of service: May 13, 2021 Patient Name: Tina [name] MRN:  [id number] DOB:  07-Jun-1940 Reason for consult: "Stroke" Requesting Provider: Vernelle Emerald, MD _ _ _   _ __   _ __ _ _  __ __   _ __   __ _  History of Present Illness  Tina [name] is a 80 y.o. female with PMH significant for DM2, HOCM, HTN, hypothyroidism seen at [facility] ED for increased confusion and lethargy x 1 day along with decline in function over the last couple weeks.  She follows with [facility] Neurology for memory loss x 2 years. She had MRI brain on 05/10/21 as part of her workup which demonstrated a late acute R frontal parasagittal infarct for which she was started on DAPT with workup planned outpatient.   Of note, she has also had recent changes to her Aricept and Namenda. She was started on Namenda 44m QHS with gradual uptitration to 134mBID and daughtyer thought that she was supposed to stop her Aricept and thus stopped Aricept without taper and later resumed but then held again a couple days ago.  History obtained from patient with colateral from daughter over phone. Patient not entirely sure why she is in the hospital. Reports she is here for headaches and her daughter was convcerned about stroke. Patient reports poor sleep quality, unable to fall or stay asleep. Gets up several times in the middle of night. Exhausted all day. Daughter lives right next agrees that patient does have very poor sleep and attributes it to taking care of 2 old dogs and taking them out multiple times in the middle of night. Daughter also reports poor eating habits and patient not taking medications as she should be. Daughter took over meds few weeks ago. Daughter also reports hoarding behavior over the last few months and that patient sleeps on the couch. Daughter also reports that patient rarely drinks water.  She is being followed by neurology outpatient and patient has had worsening  sleep pattern over the last few weeks with worsening of her memory issues and intermittent confusion. She was noted to have a stroke on MRI Brain and this coupled with her memory issues and poor sleep, she was brought in for further evaluation.   ROS   Constitutional Denies weight loss, fever and chills.   HEENT Denies changes in vision and hearing.   Respiratory Denies SOB and cough.   CV Denies palpitations and CP   GI Denies abdominal pain, nausea, vomiting and diarrhea.   GU Denies dysuria and urinary frequency.   MSK Denies myalgia and joint pain.   Skin Denies rash and pruritus.   Neurological Endorses Bifrontal headache but no syncope.   Psychiatric Denies recent changes in mood. Denies anxiety and depression.    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 Carotid [place]/15 - bilat ICA 40-59% >> FU 1 year //  b. Carotid [place]/17: bilat ICA 1-39% >> FU PRN (will get repeat in 2 years)    Diabetes mellitus    Heart murmur    History of nuclear stress test    a. Myoview 6/15 - Normal stress nuclear study. LV Ejection Fraction: 77%   HOCM (hypertrophic obstructive cardiomyopathy) ([place]   a. Echo 7/15 - Mild concentric LVH, vigorous LVF, EF 65-70%, LVOT dynamic obstruction at rest with peak gradient 20 mmHg,  grade 1 diastolic dysfunction, mild SAM, mild to moderate MR // b. Echo 10/17: EF 55-60%, normal wall motion, grade 2 diastolic dysfunction, MAC, mild to moderate MR, mild LAE (LVOT 9 mmHg)   Hyperlipidemia    Hypertension    Hypothyroidism    Memory changes    Mitral regurgitation    Past Surgical History:  Procedure Laterality Date   ABDOMINAL HYSTERECTOMY     BRAIN SURGERY     BREAST SURGERY     tumors right breast    CARDIOVASCULAR STRESS TEST  03/06/2007   EF 86%   COLONOSCOPY     GOITER REMOVAL  1972   SINUS EXPLORATION     US ECHOCARDIOGRAPHY  10/10/2006   EF 55-60%   Family History  Problem Relation Age of Onset    Heart disease Mother    Healthy Father    Breast cancer Neg Hx    Social History   Socioeconomic History   Marital status: Married    Spouse name: Not on file   Number of children: Not on file   Years of education: Not on file   Highest education level: Not on file  Occupational History   Not on file  Tobacco Use   Smoking status: Never   Smokeless tobacco: Never  Vaping Use   Vaping Use: Never used  Substance and Sexual Activity   Alcohol use: No   Drug use: No   Sexual activity: Not Currently  Other Topics Concern   Not on file  Social History Narrative   Not on file   Social Determinants of Health   Financial Resource Strain: Not on file  Food Insecurity: Not on file  Transportation Needs: Not on file  Physical Activity: Not on file  Stress: Not on file  Social Connections: Not on file   Allergies  Allergen Reactions   Carvedilol Other (See Comments)    Dizziness   Lipitor [Atorvastatin Calcium] Other (See Comments)    Pt doesn't recall   Loratadine Other (See Comments)    Unknown reaction   Metformin And Related Other (See Comments)    Unknown reaction   Pravachol Other (See Comments)    Unknown reaction   Zocor [Simvastatin] Other (See Comments)    Unknown reaction   Metoprolol Other (See Comments)    Pt states metoprolol made her "very tired."    Medications   Medications Prior to Admission  Medication Sig Dispense Refill Last Dose   aspirin EC 81 MG tablet Take 81 mg by mouth every morning. Swallow whole.   05/13/2021 at am   clopidogrel (PLAVIX) 75 MG tablet Take 1 tablet (75 mg total) by mouth daily. 30 tablet 2 05/13/2021 at am   glipiZIDE (GLUCOTROL XL) 10 MG 24 hr tablet Take 20 mg by mouth every morning.   05/13/2021 at am   losartan (COZAAR) 100 MG tablet Take 100 mg by mouth at bedtime.   05/12/2021 at pm   pioglitazone (ACTOS) 30 MG tablet Take 30 mg by mouth every morning.   05/13/2021 at am   SYNTHROID 100 MCG tablet Take 1 tablet  (100 mcg total) by mouth daily. (Patient taking differently: Take 100 mcg by mouth every morning.) 90 tablet 1 05/13/2021 at am   donepezil (ARICEPT) 10 MG tablet Take 10 mg by mouth 2 (two) times daily. (Patient not taking: Reported on 05/13/2021)   Not Taking   ezetimibe (ZETIA) 10 MG tablet Take 1 tablet (10 mg total) by mouth daily. (Patient  not taking: Reported on 05/13/2021) 30 tablet 2 Not Taking   memantine (NAMENDA) 10 MG tablet Take 1/2 tab at night for one week. Then take 1/2 tab in the morning and night for one week. Then take 1/2 tab in the morning and 1 tab at night for one week. Then take 1 tab twice a day (Patient not taking: Reported on 05/13/2021) 120 tablet 2 Not Taking     Vitals   Vitals:   05/13/21 1430 05/13/21 1630 05/13/21 1825 05/13/21 1941  BP: (!) 139/57 (!) 162/54 (!) 158/62 (!) 172/63  Pulse: 65  77 78  Resp: _0 Temp:   98.2 F (36.8 C) 98.1 F (36.7 C)  TempSrc:   Oral Oral  SpO2: 100%  100% 100%  Weight:    61 kg  Height:    _1  (1.575 m)     Body mass index is 24.6 kg/m.  Physical Exam   General: Laying comfortably in bed; in no acute distress.  HENT: Normal oropharynx and mucosa. Normal external appearance of ears and nose.  Neck: Supple, no pain or tenderness  CV: No JVD. No peripheral edema.  Pulmonary: Symmetric Chest rise. Normal respiratory effort.  Abdomen: Soft to touch, non-tender.  Ext: No cyanosis, edema, or deformity  Skin: No rash. Normal palpation of skin.   Musculoskeletal: Normal digits and nails by inspection. No clubbing.   Neurologic Examination  Mental status/Cognition: Alert, oriented to self, place, month and year, good attention.  Speech/language: Fluent, comprehension intact, object naming intact, repetition intact.  Cranial nerves:   CN II Pupils equal and reactive to light, no VF deficits    CN III,IV,VI EOM intact, no gaze preference or deviation, no nystagmus    CN V normal sensation in V1, V2, and V3  segments bilaterally    CN VII no asymmetry, no nasolabial fold flattening    CN VIII normal hearing to speech    CN IX & X normal palatal elevation, no uvular deviation    CN XI 5/5 head turn and 5/5 shoulder shrug bilaterally    CN XII midline tongue protrusion    Motor:  Muscle bulk: poor, tone normal, pronator drift none tremor none Mvmt Root Nerve  Muscle Right Left Comments  SA C5/6 Ax Deltoid 5 5   EF C5/6 Mc Biceps 5 5   EE C6/7/8 Rad Triceps 5 5   WF C6/7 Med FCR     WE C7/8 PIN ECU     F Ab C8/T1 U ADM/FDI 5 5   HF L1/2/3 Fem Illopsoas 4+ 4+   KE L2/3/4 Fem Quad 5 5   DF L4/5 D Peron Tib Ant 5 5   PF S1/2 Tibial Grc/Sol 5 5    Reflexes:  Right Left Comments  Pectoralis      Biceps (C5/6) 2 2   Brachioradialis (C5/6) 2 2    Triceps (C6/7) 2 2    Patellar (L3/4) 2 2    Achilles (S1)      Hoffman      Plantar     Jaw jerk    Sensation:  Light touch Intact BL   Pin prick    Temperature    Vibration   Proprioception    Coordination/Complex Motor:  - Finger to Nose intact BL - Heel to shin intact BL - Rapid alternating movement are normal - Gait: Deferred.  Labs   CBC:  Recent Labs  Lab 05/13/21 1136  WBC 4.4  NEUTROABS 2.7  HGB 12.2  HCT 39.0  MCV 80.2  PLT 536    Basic Metabolic Panel:  Lab Results  Component Value Date   NA 138 05/13/2021   K 4.3 05/13/2021   CO2 31 05/13/2021   GLUCOSE 139 (H) 05/13/2021   BUN 20 05/13/2021   CREATININE 0.97 05/13/2021   CALCIUM 8.9 05/13/2021   GFRNONAA 58 (L) 05/13/2021   GFRAA 69 05/15/2017   Lipid Panel:  Lab Results  Component Value Date   LDLCALC 103 (H) 05/11/2021   HgbA1c:  Lab Results  Component Value Date   HGBA1C 9.2 (H) 05/11/2021   Urine Drug Screen: No results found for: LABOPIA, COCAINSCRNUR, LABBENZ, AMPHETMU, THCU, LABBARB  Alcohol Level No results found for: [facility]  CT angio Head and Neck with contrast(Personally reviewed): 1. Evolving subacute infarct in the right frontal  lobe periventricular white matter in the ACA distribution as seen on prior MRI. No evidence of new infarct or other acute intracranial pathology. 2. Occlusion of the right P2 segment shortly after the anterior communicating artery without appreciable reconstitution of flow. 3. Occlusion of the left PCA at the P1/P2 junction with diminutive reconstitution of flow distally. 4. Other intracranial atherosclerotic disease as above resulting in severe focal stenosis of the right PCA at the P1/P2 junction with reconstitution of flow distally, severe stenosis of an inferior right M2 branch, and moderate stenosis of the intracranial ICAs bilaterally. 5. 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 6. 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   MRI Brain(Personally reviewed): Late acute right frontal parasagittal infarct.  rEEG:  pending  Impression   AVEN CEGIELSKI is a 80 y.o. female with PMH significant for DM2, HOCM, HTN, ypothyroidism seen at [facility] ED for increased confusion and lethargy x 1 day along with decline in function over the last few weeks. Her decline in function was noted to happen in the setting of changes to Namenda and Aricept. Evaluating her for dementia mimics reveals poor sleep quality and inability to fall asleep and stay asleep with excessive daytime sleepiness and inattention. She also has poor diet and avoids vegetables and dairy.  I do have some concerns regarding underlying dementia but would recommend outpatient evaluation into dementia mimickers including poor sleep, depression. Her memory issues could partially be due to inability to pay attention due to chronic exhaustion from poor sleep quality and underlying concerns for depression(per daughter).   Agree with continuing Namenda for now and  will defer to outpatient neurology team regarding Donepezil.  As for her stroke, I suspect that this is likely small vessel stroke from her poorly controlled Diabetes and high blood pressure. TTE is pending.  Recommendations   - Frequent Neuro checks per stroke unit protocol - Recommend obtaining TTE - Recent LDL was 103, dietary modification. She is allergic to statin. Agree with Ezetimibe. - Recent HbA1c of 9.2, has been trending down. Would recommend better glucose control with goal A1c of < 7.0. - Antithrombotic - Aspirin 31m daily along with plavix 7110mdaily x 21 days followed by Aspirin 8149maily alone. - Recommend DVT ppx - SBP goal - gradual normotension, she is outside the window for permissive hypertension. - Recommend Telemetry monitoring for arrythmia - Recommend bedside swallow screen prior to PO intake. - Stroke education booklet - Recommend PT/OT/SLP consult. - Outpatient follow up with  sleep clinic.  ______________________________________________________________________   Thank you for the opportunity to take part in the care of this patient. If you have any further questions, please contact the neurology consultation attending.  Signed,  Tina Pager Number [phone] _ _ _   _ __   _ __ _ _  __ __   _ __   __ _

## 2021-05-13 NOTE — Assessment & Plan Note (Signed)
·   Right frontal stroke identified on MRI with occlusion of the ACA  Patient has recently been initiated on Plavix and aspirin therapy by her outpatient neurologist on 12/14, this will be continued at this time  CT angiogram of the head and neck has already been performed in the emergency department Performing serial neurologic checks Monitoring patient on telemetry Daily statin therapy will be initiated if LDL is greater than 70 Obtaining hemoglobin A1c and lipid panel in the morning Echocardiogram in the morning PT, OT, SLP evaluation Permissive hypertension not required at this point considering patient is at least several days out from the onset of the stroke. Neurology following in consultation, I have personally discussed the case with Dr. Derry Lory who will come see the patient, his input is appreciated.

## 2021-05-13 NOTE — ED Notes (Signed)
Pt unable to urinate at this time.  

## 2021-05-13 NOTE — ED Notes (Signed)
She returns from CT at this time. She is awake, alert and in no distress. Her daughter is with her.

## 2021-05-14 ENCOUNTER — Observation Stay (HOSPITAL_COMMUNITY): Payer: Medicare Other

## 2021-05-14 DIAGNOSIS — I4891 Unspecified atrial fibrillation: Secondary | ICD-10-CM | POA: Diagnosis not present

## 2021-05-14 DIAGNOSIS — I63521 Cerebral infarction due to unspecified occlusion or stenosis of right anterior cerebral artery: Secondary | ICD-10-CM | POA: Diagnosis present

## 2021-05-14 DIAGNOSIS — F419 Anxiety disorder, unspecified: Secondary | ICD-10-CM | POA: Diagnosis present

## 2021-05-14 DIAGNOSIS — E119 Type 2 diabetes mellitus without complications: Secondary | ICD-10-CM | POA: Diagnosis not present

## 2021-05-14 DIAGNOSIS — Z20822 Contact with and (suspected) exposure to covid-19: Secondary | ICD-10-CM | POA: Diagnosis present

## 2021-05-14 DIAGNOSIS — I5032 Chronic diastolic (congestive) heart failure: Secondary | ICD-10-CM | POA: Diagnosis present

## 2021-05-14 DIAGNOSIS — R471 Dysarthria and anarthria: Secondary | ICD-10-CM | POA: Diagnosis not present

## 2021-05-14 DIAGNOSIS — I639 Cerebral infarction, unspecified: Secondary | ICD-10-CM | POA: Diagnosis present

## 2021-05-14 DIAGNOSIS — Z7189 Other specified counseling: Secondary | ICD-10-CM | POA: Diagnosis not present

## 2021-05-14 DIAGNOSIS — R638 Other symptoms and signs concerning food and fluid intake: Secondary | ICD-10-CM | POA: Diagnosis not present

## 2021-05-14 DIAGNOSIS — I10 Essential (primary) hypertension: Secondary | ICD-10-CM | POA: Diagnosis not present

## 2021-05-14 DIAGNOSIS — R4182 Altered mental status, unspecified: Secondary | ICD-10-CM | POA: Diagnosis not present

## 2021-05-14 DIAGNOSIS — F03918 Unspecified dementia, unspecified severity, with other behavioral disturbance: Secondary | ICD-10-CM | POA: Diagnosis not present

## 2021-05-14 DIAGNOSIS — F05 Delirium due to known physiological condition: Secondary | ICD-10-CM | POA: Diagnosis not present

## 2021-05-14 DIAGNOSIS — R413 Other amnesia: Secondary | ICD-10-CM | POA: Diagnosis present

## 2021-05-14 DIAGNOSIS — U071 COVID-19: Secondary | ICD-10-CM | POA: Diagnosis not present

## 2021-05-14 DIAGNOSIS — G8324 Monoplegia of upper limb affecting left nondominant side: Secondary | ICD-10-CM | POA: Diagnosis present

## 2021-05-14 DIAGNOSIS — G8191 Hemiplegia, unspecified affecting right dominant side: Secondary | ICD-10-CM | POA: Diagnosis not present

## 2021-05-14 DIAGNOSIS — N390 Urinary tract infection, site not specified: Secondary | ICD-10-CM | POA: Diagnosis not present

## 2021-05-14 DIAGNOSIS — I421 Obstructive hypertrophic cardiomyopathy: Secondary | ICD-10-CM | POA: Diagnosis present

## 2021-05-14 DIAGNOSIS — I110 Hypertensive heart disease with heart failure: Secondary | ICD-10-CM | POA: Diagnosis present

## 2021-05-14 DIAGNOSIS — E039 Hypothyroidism, unspecified: Secondary | ICD-10-CM | POA: Diagnosis present

## 2021-05-14 DIAGNOSIS — I959 Hypotension, unspecified: Secondary | ICD-10-CM | POA: Diagnosis not present

## 2021-05-14 DIAGNOSIS — E1151 Type 2 diabetes mellitus with diabetic peripheral angiopathy without gangrene: Secondary | ICD-10-CM | POA: Diagnosis present

## 2021-05-14 DIAGNOSIS — R319 Hematuria, unspecified: Secondary | ICD-10-CM | POA: Diagnosis not present

## 2021-05-14 DIAGNOSIS — E11649 Type 2 diabetes mellitus with hypoglycemia without coma: Secondary | ICD-10-CM | POA: Diagnosis present

## 2021-05-14 DIAGNOSIS — R296 Repeated falls: Secondary | ICD-10-CM | POA: Diagnosis present

## 2021-05-14 DIAGNOSIS — I6389 Other cerebral infarction: Secondary | ICD-10-CM

## 2021-05-14 DIAGNOSIS — R531 Weakness: Secondary | ICD-10-CM | POA: Diagnosis not present

## 2021-05-14 DIAGNOSIS — E785 Hyperlipidemia, unspecified: Secondary | ICD-10-CM | POA: Diagnosis not present

## 2021-05-14 DIAGNOSIS — I2510 Atherosclerotic heart disease of native coronary artery without angina pectoris: Secondary | ICD-10-CM | POA: Diagnosis present

## 2021-05-14 DIAGNOSIS — G9341 Metabolic encephalopathy: Secondary | ICD-10-CM | POA: Diagnosis present

## 2021-05-14 LAB — GLUCOSE, CAPILLARY
Glucose-Capillary: 108 mg/dL — ABNORMAL HIGH (ref 70–99)
Glucose-Capillary: 126 mg/dL — ABNORMAL HIGH (ref 70–99)
Glucose-Capillary: 160 mg/dL — ABNORMAL HIGH (ref 70–99)
Glucose-Capillary: 76 mg/dL (ref 70–99)

## 2021-05-14 LAB — COMPREHENSIVE METABOLIC PANEL
ALT: 12 U/L (ref 0–44)
AST: 15 U/L (ref 15–41)
Albumin: 3.2 g/dL — ABNORMAL LOW (ref 3.5–5.0)
Alkaline Phosphatase: 108 U/L (ref 38–126)
Anion gap: 6 (ref 5–15)
BUN: 14 mg/dL (ref 8–23)
CO2: 26 mmol/L (ref 22–32)
Calcium: 8.8 mg/dL — ABNORMAL LOW (ref 8.9–10.3)
Chloride: 106 mmol/L (ref 98–111)
Creatinine, Ser: 0.72 mg/dL (ref 0.44–1.00)
GFR, Estimated: >60 mL/min (ref >60)
Glucose, Bld: 123 mg/dL — ABNORMAL HIGH (ref 70–99)
Potassium: 3.8 mmol/L (ref 3.5–5.1)
Sodium: 138 mmol/L (ref 135–145)
Total Bilirubin: 0.5 mg/dL (ref 0.3–1.2)
Total Protein: 5.9 g/dL — ABNORMAL LOW (ref 6.5–8.1)

## 2021-05-14 LAB — LIPID PANEL
Cholesterol: 195 mg/dL (ref 0–200)
HDL: 72 mg/dL (ref >40)
LDL Cholesterol: 111 mg/dL — ABNORMAL HIGH (ref 0–99)
Total CHOL/HDL Ratio: 2.7 RATIO
Triglycerides: 62 mg/dL (ref <150)
VLDL: 12 mg/dL (ref 0–40)

## 2021-05-14 LAB — MAGNESIUM: Magnesium: 1.9 mg/dL (ref 1.7–2.4)

## 2021-05-14 LAB — CBC WITH DIFFERENTIAL/PLATELET
Abs Immature Granulocytes: 0.01 10*3/uL (ref 0.00–0.07)
Basophils Absolute: 0 10*3/uL (ref 0.0–0.1)
Basophils Relative: 1 %
Eosinophils Absolute: 0.2 10*3/uL (ref 0.0–0.5)
Eosinophils Relative: 4 %
HCT: 37.3 % (ref 36.0–46.0)
Hemoglobin: 11.9 g/dL — ABNORMAL LOW (ref 12.0–15.0)
Immature Granulocytes: 0 %
Lymphocytes Relative: 25 %
Lymphs Abs: 1.1 10*3/uL (ref 0.7–4.0)
MCH: 25.2 pg — ABNORMAL LOW (ref 26.0–34.0)
MCHC: 31.9 g/dL (ref 30.0–36.0)
MCV: 79 fL — ABNORMAL LOW (ref 80.0–100.0)
Monocytes Absolute: 0.4 10*3/uL (ref 0.1–1.0)
Monocytes Relative: 9 %
Neutro Abs: 2.7 10*3/uL (ref 1.7–7.7)
Neutrophils Relative %: 61 %
Platelets: 263 10*3/uL (ref 150–400)
RBC: 4.72 MIL/uL (ref 3.87–5.11)
RDW: 16.2 % — ABNORMAL HIGH (ref 11.5–15.5)
WBC: 4.3 10*3/uL (ref 4.0–10.5)
nRBC: 0 % (ref 0.0–0.2)

## 2021-05-14 LAB — ECHOCARDIOGRAM COMPLETE BUBBLE STUDY
AR max vel: 2.51 cm2
AV Area VTI: 2.68 cm2
AV Area mean vel: 2.57 cm2
AV Mean grad: 11.5 mmHg
AV Peak grad: 20.9 mmHg
Ao pk vel: 2.29 m/s
Area-P 1/2: 4.29 cm2
S' Lateral: 2.1 cm

## 2021-05-14 LAB — HEMOGLOBIN A1C
Hgb A1c MFr Bld: 9.2 % — ABNORMAL HIGH (ref 4.8–5.6)
Mean Plasma Glucose: 217.34 mg/dL

## 2021-05-14 MED ORDER — ROSUVASTATIN CALCIUM 5 MG PO TABS
5.0000 mg | ORAL_TABLET | Freq: Every day | ORAL | Status: DC
  Administered 2021-05-14 – 2021-05-31 (×18): 5 mg via ORAL
  Filled 2021-05-14 (×18): qty 1

## 2021-05-14 NOTE — Evaluation (Addendum)
Physical Therapy Evaluation and Discharge Patient Details Name: Tina [name] MRN: [id number] DOB: 11-Sep-1940 Today's Date: 05/14/2021  History of Present Illness  Tina [name] is a 80 y.o. female who presented with increased confusion and lethargy x 1 day along with decline in function over the last couple weeks. 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 PMH significant for DM2, HOCM, HTN, hypothyroidism, memory changes.  Clinical Impression  Pt admitted with above. Pt is overall mobilizing well; ambulating 300 feet with and without a walker at a supervision level and negotiated 5 steps with railings. Pt demonstrating mild dynamic balance deficits and decreased gait speed, particularly with dual tasking. Pt with poor short term memory recall and awareness, thus having difficulty with way finding in hallway. Pt son states family plans to increase supervision level, particularly with IADL's and pt is not going to drive anymore following discharge. Recommend OPPT for further balance training and strengthening. No further acute PT needs. Thank you for this consult.      Recommendations for follow up therapy are one component of a multi-disciplinary discharge planning process, led by the attending physician.  Recommendations may be updated based on patient status, additional functional criteria and insurance authorization.  Follow Up Recommendations Outpatient PT (at [facility])    Assistance Recommended at Discharge Intermittent Supervision/Assistance (for IADL's)  Functional Status Assessment Patient has had a recent decline in their functional status and demonstrates the ability to make significant improvements in function in a reasonable and predictable amount of time.  Equipment Recommendations  None recommended by PT    Recommendations for Other Services       Precautions / Restrictions Precautions Precautions: Fall Restrictions Weight Bearing Restrictions: No       Mobility  Bed Mobility               General bed mobility comments: OOB in chair    Transfers Overall transfer level: Modified independent Equipment used: Rolling walker (2 wheels)                    Ambulation/Gait Ambulation/Gait assistance: Supervision Gait Distance (Feet): 300 Feet Assistive device: Rolling walker (2 wheels);None Gait Pattern/deviations: Step-through pattern;Trunk flexed       General Gait Details: Mild trunk flexion, cues for walker proximity. supervision for safety  Stairs Stairs: Yes Stairs assistance: Supervision Stair Management: Two rails Number of Stairs: 5 General stair comments: cues for step by step  Wheelchair Mobility    Modified Rankin (Stroke Patients Only) Modified Rankin (Stroke Patients Only) Pre-Morbid Rankin Score: No significant disability Modified Rankin: Moderately severe disability     Balance Overall balance assessment: Needs assistance;History of Falls Sitting-balance support: Feet supported Sitting balance-Leahy Scale: Good     Standing balance support: During functional activity;No upper extremity supported Standing balance-Leahy Scale: Good                               Pertinent Vitals/Pain Pain Assessment: No/denies pain    Home Living Family/patient expects to be discharged to:: Private residence Living Arrangements: Spouse/significant other Available Help at Discharge: Family;Available 24 hours/day (duaghter lives next door & provides meals and checks on pt daily) Type of Home: House Home Access: Stairs to enter Entrance Stairs-Rails: Chemical engineer of Steps: 4 Alternate Level Stairs-Number of Steps: flight down to basement Home Layout: Two level;Able to live on main level with bedroom/bathroom (pt's "large freezer" is in the basement.  Reports multiple falls hen attempting to go down into the basement.) Home Equipment: Standard Walker;Cane - single  point;Shower seat;Grab bars - tub/shower;Hand held shower head Additional Comments: pt's husband is "disabled" wtih 2 bad knees and liver disease; pt dos not have to physically lift him but she does "wait on him for every need" per pt's daughter.    Prior Function Prior Level of Function : Needs assist  Cognitive Assist : ADLs (cognitive)   ADLs (Cognitive): Intermittent cues Physical Assist : Mobility (physical);ADLs (physical) Mobility (physical): Gait ADLs (physical): Grooming;Bathing;Dressing;Toileting;IADLs Mobility Comments: uses SPC vs walker, has had multiple falls ADLs Comments: no physical assist needed, but has had multiple falls. daughter assists as needed     Hand Dominance   Dominant Hand: Right    Extremity/Trunk Assessment   Upper Extremity Assessment Upper Extremity Assessment: Defer to OT evaluation    Lower Extremity Assessment Lower Extremity Assessment: Overall WFL for tasks assessed    Cervical / Trunk Assessment Cervical / Trunk Assessment: Kyphotic  Communication   Communication: No difficulties  Cognition Arousal/Alertness: Awake/alert Behavior During Therapy: Flat affect Overall Cognitive Status: History of cognitive impairments - at baseline                                 General Comments: Decreased STM; unable to wayfind in hallway. follows commands        General Comments      Exercises     Assessment/Plan    PT Assessment Patient does not need any further PT services  PT Problem List         PT Treatment Interventions      PT Goals (Current goals can be found in the Care Plan section)  Acute Rehab PT Goals Patient Stated Goal: pt son would like for her sleep and physical activity to increase/improve PT Goal Formulation: All assessment and education complete, DC therapy    Frequency     Barriers to discharge        Co-evaluation               AM-PAC PT "6 Clicks" Mobility  Outcome Measure Help  needed turning from your back to your side while in a flat bed without using bedrails?: None Help needed moving from lying on your back to sitting on the side of a flat bed without using bedrails?: None Help needed moving to and from a bed to a chair (including a wheelchair)?: A Little Help needed standing up from a chair using your arms (e.g., wheelchair or bedside chair)?: None Help needed to walk in hospital room?: A Little Help needed climbing 3-5 steps with a railing? : A Little 6 Click Score: 21    End of Session Equipment Utilized During Treatment: Gait belt Activity Tolerance: Patient tolerated treatment well Patient left: in chair;with call bell/phone within reach;with family/visitor present Nurse Communication: Mobility status PT Visit Diagnosis: History of falling (Z91.81);Unsteadiness on feet (R26.81)    Time: 6168-3729 PT Time Calculation (min) (ACUTE ONLY): 24 min   Charges:   PT Evaluation $PT Eval Low Complexity: 1 Low PT Treatments $Therapeutic Activity: 8-22 mins        Lillia Pauls, PT, DPT Acute Rehabilitation Services Pager [phone] Office [phone]   [name] 05/14/2021, 4:37 PM

## 2021-05-14 NOTE — Progress Notes (Signed)
°  Echocardiogram 2D Echocardiogram has been performed.  Delcie Roch 05/14/2021, 9:39 AM

## 2021-05-14 NOTE — Progress Notes (Signed)
PT Cancellation Note  Patient Details Name: SEJLA MARZANO MRN: [id number] DOB: 05/09/1941   Cancelled Treatment:    Reason Eval/Treat Not Completed: Patient at procedure or test/unavailable (echo)  Lillia Pauls, PT, DPT Acute Rehabilitation Services Pager [phone] Office [phone]    [name] 05/14/2021, 8:19 AM

## 2021-05-14 NOTE — Progress Notes (Signed)
PROGRESS NOTE  Tina [name]  DOB: 05-20-1941  PCP: Kaleen Mask, MD PTW:[id number]  DOA: 05/13/2021  LOS: 0 days  Hospital Day: 2  Chief Complaint  Patient presents with   Altered Mental Status   Brief narrative: Tina [name] is a 80 y.o. female with PMH significant for DM2, HOCM, HTN, hypothyroidism, dementia Patient presented at [facility] ED on 12/17 for increased confusion and lethargy for a day along with decline in function over the last couple of weeks.   For the last 2 years, patient has been following up with [facility] neurology for progressive memory loss.  As part of the work-up, she had an MRI brain done on 12/14 which showed a late acute R frontal parasagittal infarct for which she was started on DAPT with workup planned outpatient.   In the ED, patient was afebrile, hemodynamically stable CT angio of head and neck showed 1. Evolving subacute infarct in the right frontal lobe periventricular white matter in the ACA distribution as seen on prior MRI from 12/14. No evidence of new infarct or other acute intracranial pathology. 2. Multifocal bilateral intracranial small vessel occlusions, stenosis  3. 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  Patient admitted to hospitalist service. Neurology consultation was obtained.  Subjective: Patient was seen and examined this morning.  Sitting in the chair Pleasant elderly Caucasian female.  Not in distress.  Daughter at bedside. 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 -Stroke work-up initiated. -A1c 9.2, HDL 72, LDL 111 -Neurology  consult appreciated.  PT/OT eval pending. -Echo with EF 70 to 75%, grade 1 diastolic dysfunction, bubble study negative. -On 12/14, she was started on aspirin and Plavix which should be continued along with statin.  Acute metabolic encephalopathy Progressively worsening dementia. -Per history, patient has been showing progressive mental decline over the past 1 to 2 years.  Follows up with [facility] as an outpatient.   -Recently initiated on Namenda in November.   -Patient reportedly has poor sleep quality, unable to fall or stay asleep. Gets up several times in the middle of night, remains exhausted all day.  She takes care of 2 old dogs and taking them out multiple times in the middle of night. Daughter also reports poor eating habits and patient not taking medications as she should be. Daughter took over meds few weeks ago. Daughter also reports hoarding behavior over the last few months and that patient sleeps on the couch. Daughter also reports that patient rarely drinks water.    Frequent falls -Likely due to progressive physical decline with ongoing dementia.   -PT eval pending.  Uncontrolled type 2 diabetes mellitus Hypoglycemia -A1c 9.2 on 05/14/2021.  Blood sugar level was low in the 60s yesterday afternoon. -Home meds include glipizide and Actos  -Currently on sliding scale insulin with Accu-Cheks only.  Continue monitor. Recent Labs  Lab 05/13/21 1115 05/13/21 1433 05/13/21 1510 05/13/21 1622 05/14/21 0646  GLUCAP 197* 66* 61* 165* 108*   Hyperlipidemia -Apparently noncompliant to Zetia at home.    Essential hypertension -Losartan    Hypothyroidism Resume home regimen of Synthroid    Mobility: PT eval Living condition: Lives at home with husband  Goals of care:   Code Status: Full Code  Nutritional status: Body mass index is 24.6 kg/m.      Diet:  Diet Order             Diet heart healthy/carb modified Room service appropriate? Yes; Fluid  consistency: Thin  Diet effective now                  DVT prophylaxis:  enoxaparin (LOVENOX) injection 40 mg Start: 05/13/21 2145   Antimicrobials: None Fluid: None Consultants: Neurology Family Communication: Discussed with daughter at bedside  Status is: Observation  Continue in-hospital care because: Completion of stroke work-up Level of care: Telemetry Medical   Dispo: The patient is from: Home              Anticipated d/c is to: Pending PT eval              Patient currently is not medically stable to d/c.   Difficult to place patient No     Infusions:    Scheduled Meds:  aspirin EC  81 mg Oral q morning   clopidogrel  75 mg Oral Daily   enoxaparin (LOVENOX) injection  40 mg Subcutaneous Q24H   insulin aspart  0-6 Units Subcutaneous TID AC & HS   levothyroxine  100 mcg Oral Q0600   losartan  100 mg Oral QHS   multivitamin with minerals  1 tablet Oral Daily    PRN meds: acetaminophen **OR** acetaminophen, hydrALAZINE, ondansetron **OR** ondansetron (ZOFRAN) IV, polyethylene glycol   Antimicrobials: Anti-infectives (From admission, onward)    None       Objective: Vitals:   05/14/21 0600 05/14/21 0731  BP: 138/62 (!) 157/72  Pulse: 74 78  Resp:  17  Temp: 97.7 F (36.5 C) 98.3 F (36.8 C)  SpO2: 98% 100%    Intake/Output Summary (Last 24 hours) at 05/14/2021 0758 Last data filed at 05/14/2021 0000 Gross per 24 hour  Intake 240 ml  Output --  Net 240 ml   Filed Weights   05/13/21 1941  Weight: 61 kg   Weight change:  Body mass index is 24.6 kg/m.   Physical Exam: General exam: Pleasant elderly Caucasian female.  Sitting up in chair.  Not in distress.  Daughter at bedside. Skin: No rashes, lesions or ulcers. HEENT: Atraumatic, normocephalic, no obvious bleeding Lungs: Clear to auscultation bilaterally CVS: Regular rate and rhythm, no murmur GI/Abd soft, nontender, nondistended, bowel sound present CNS: Alert, awake, oriented to  place, person and with difficulty to time Psychiatry: Mood appropriate Extremities: No pedal edema, no calf tenderness  Data Review: I have personally reviewed the laboratory data and studies available.  F/u labs  Unresulted Labs (From admission, onward)     Start     Ordered   05/13/21 2210  Urine Culture  Add-on,   AD       Question:  Indication  Answer:  Altered mental status (if no other cause identified)   05/13/21 2209            Signed, Lorin Glass, MD [facility] 05/14/2021

## 2021-05-14 NOTE — TOC Initial Note (Addendum)
Transition of Care [facility]) - Initial/Assessment Note    Patient Details  Name: Tina [name] MRN: [id number] Date of Birth: [birth year]  [facility]) CM/SW Contact:    Lawerance Sabal, RN Phone Number: 05/14/2021, 2:18 PM  Clinical Narrative:          spoke w patient and daughter at bedside. Plan is to return home (pending PT eval and recommendations). Daughter [place] her. She can get her to outpatient PT, prefers [facility], is also agreeable to SNF if needed, does not want HH.           Expected Discharge Plan: Home/Self Care Barriers to Discharge: Continued Medical Work up   Patient Goals and CMS Choice Patient states their goals for this hospitalization and ongoing recovery are:: return home      Expected Discharge Plan and Services Expected Discharge Plan: Home/Self Care   Discharge Planning Services: CM Consult   Living arrangements for the past 2 months: Single Family Home                                      Prior Living Arrangements/Services Living arrangements for the past 2 months: Single Family Home Lives with:: Spouse              Current home services: DME (has walker)    Activities of Daily Living Home Assistive Devices/Equipment: None ADL Screening (condition at time of admission) Patient's cognitive ability adequate to safely complete daily activities?: No Is the patient deaf or have difficulty hearing?: Yes Does the patient have difficulty seeing, even when wearing glasses/contacts?: No Does the patient have difficulty concentrating, remembering, or making decisions?: Yes Patient able to express need for assistance with ADLs?: Yes Does the patient have difficulty dressing or bathing?: No Independently performs ADLs?: No Communication: Independent Dressing (OT): Independent Grooming: Independent Feeding: Independent Bathing: Needs assistance Is this a change from baseline?: Change from baseline,  expected to last <3 days Toileting: Needs assistance Is this a change from baseline?: Change from baseline, expected to last <3 days In/Out Bed: Independent Walks in Home: Independent Does the patient have difficulty walking or climbing stairs?: No Weakness of Legs: None Weakness of Arms/Hands: None  Permission Sought/Granted                  Emotional Assessment              Admission diagnosis:  Stroke (cerebrum) (HCC) [I63.9] Cerebrovascular accident (CVA) due to occlusion of right anterior cerebral artery (HCC) [I63.521] Patient Active Problem List   Diagnosis Date Noted   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 (HCC) 05/13/2021   Mixed diabetic hyperlipidemia associated with type 2 diabetes mellitus (HCC) 05/13/2021   Essential hypertension 05/13/2021   Acute metabolic encephalopathy 05/13/2021   Frequent falls 05/13/2021   Dementia without behavioral disturbance (HCC) 05/13/2021   Mixed hyperlipidemia 04/03/2018   Right carotid bruit 12/16/2013   Hypertensive urgency 07/01/2012   Chest pain 07/01/2012   Dyslipidemia 01/10/2012   Benign hypertensive heart disease without heart failure 12/11/2010   Hypothyroidism 12/11/2010   Aortic valve disease 12/11/2010   Sinusitis acute 12/11/2010   Type 2 diabetes mellitus with hypoglycemia without coma, without long-term current use of insulin (HCC) 04/17/2010   HEMORRHOIDS-EXTERNAL 04/17/2010   PCP:  Kaleen Mask, MD Pharmacy:   [facility]  [place],  - [address] [address] [place] [address] [place] [address] Phone: [phone] Fax: [phone]     Social Determinants of Health (SDOH) Interventions    Readmission Risk Interventions No flowsheet data found.

## 2021-05-14 NOTE — Progress Notes (Addendum)
STROKE TEAM PROGRESS NOTE   INTERVAL HISTORY Her daughter is at the bedside.  Patient is sitting comfortably in the chair. She and her daughter are able to give a clear and coherent history of the events leading up to this hospitalization.  She is currently on cardiac monitoring, plan for a loop recorder if we don't find afib here. Discussed with the patient and her daughter.  The patient did state that she does have a history of some sort of irregular heartbeat in the past but has never been formally diagnosed with A. fib.  MRI scan shows a subacute right ACA branch infarct and CT angiogram shows right A2 occlusion.  LDL cholesterol is elevated at 1 1 1  mg percent and hemoglobin A1c at 9.2.  Vitals:   05/14/21 0210 05/14/21 0404 05/14/21 0600 05/14/21 0731  BP: (!) 159/60 132/60 138/62 (!) 157/72  Pulse: 84 75 74 78  Resp:    17  Temp: 98.2 F (36.8 C) 97.9 F (36.6 C) 97.7 F (36.5 C) 98.3 F (36.8 C)  TempSrc: Oral Oral Oral Oral  SpO2: 100% 98% 98% 100%  Weight:      Height:       CBC:  Recent Labs  Lab 05/13/21 1136 05/14/21 0439  WBC 4.4 4.3  NEUTROABS 2.7 2.7  HGB 12.2 11.9*  HCT 39.0 37.3  MCV 80.2 79.0*  PLT 278 [id number]   Basic Metabolic Panel:  Recent Labs  Lab 05/13/21 1136 05/14/21 0439  NA 138 138  K 4.3 3.8  CL 103 106  CO2 31 26  GLUCOSE 139* 123*  BUN 20 14  CREATININE 0.97 0.72  CALCIUM 8.9 8.8*  MG 1.9 1.9   Lipid Panel:  Recent Labs  Lab 05/14/21 0439  CHOL 195  TRIG 62  HDL 72  CHOLHDL 2.7  VLDL 12  LDLCALC 111*   HgbA1c:  Recent Labs  Lab 05/14/21 0439  HGBA1C 9.2*   Urine Drug Screen: No results for input(s): LABOPIA, COCAINSCRNUR, LABBENZ, AMPHETMU, THCU, LABBARB in the last 168 hours.  Alcohol Level No results for input(s): ETH in the last 168 hours.  IMAGING past 24 hours CT ANGIO HEAD NECK W WO CM  Result Date: 05/13/2021 CLINICAL DATA:  Recent stroke, worsening altered mental status, concern for new stroke EXAM: CT  ANGIOGRAPHY HEAD AND NECK TECHNIQUE: Multidetector CT imaging of the head and neck was performed using the standard protocol during bolus administration of intravenous contrast. Multiplanar CT image reconstructions and MIPs were obtained to evaluate the vascular anatomy. Carotid stenosis measurements (when applicable) are obtained utilizing NASCET criteria, using the distal internal carotid diameter as the denominator. CONTRAST:  65mL OMNIPAQUE IOHEXOL 350 MG/ML SOLN COMPARISON:  Brain MRI 05/10/2021 FINDINGS: CT HEAD FINDINGS Brain: There is hypodensity in the right frontal lobe white matter abutting the superior margin of the right lateral ventricle consistent with evolving subacute infarct as seen on prior MRI. There is no other evidence of acute infarct. There is no acute intracranial hemorrhage or extra-axial fluid collection. There is mild parenchymal volume loss with prominence of the extra-axial CSF spaces overlying the bilateral cerebral hemispheres. The ventricles are normal in size. There is no mass lesion.  There is no midline shift. Vascular: There is mild calcification of the intracranial ICAs. No dense vessel is seen. Skull: Normal. Negative for fracture or focal lesion. Sinuses: Postsurgical changes are seen in the sinuses. Orbits: Bilateral lens implants are in place. The globes and orbits are otherwise unremarkable. Review of  the MIP images confirms the above findings CTA NECK FINDINGS Aortic arch: There is mild calcified atherosclerotic plaque in the aortic arch. The origins of the major branch vessels are patent. Right carotid system: The right common carotid artery is patent. There is calcified atherosclerotic plaque in the proximal right internal carotid artery resulting in up to approximately 40-50% stenosis. The distal right internal carotid artery is patent. The right external carotid artery is patent. There is no evidence of dissection or aneurysm. Left carotid system: The left common  carotid artery is patent. There is mild calcified atherosclerotic plaque in the proximal left internal carotid artery without hemodynamically significant stenosis or occlusion. The distal left internal carotid artery is patent. The left external carotid artery is patent. There is no dissection or aneurysm. Vertebral arteries: The vertebral arteries are patent, without hemodynamically significant stenosis, occlusion, dissection, or aneurysm. Skeleton: There is mild multilevel degenerative change in the cervical spine. There is no visible canal hematoma. There is no acute osseous abnormality or aggressive osseous lesion. Other neck: The thyroid is heterogeneous with partially calcified nodules, the largest measuring up to 1.2 cm. The soft tissues are otherwise unremarkable. Upper chest: The imaged lung apices are clear. Review of the MIP images confirms the above findings CTA HEAD FINDINGS Anterior circulation: There is calcified atherosclerotic plaque in the bilateral intracranial ICAs resulting in up to moderate stenosis of the right ophthalmic segment and moderate stenosis of the left supraclinoid segment. There is a 2 mm inferiorly directed outpouching arising from the left supraclinoid segment (15-152). No definite vessel is seen emanating from the tip of this outpouching. The bilateral A1 segments are patent. The anterior communicating artery is patent. The right A2 segment is occluded proximally without appreciable reconstitution of flow. The left ACA is patent throughout. There is an approximately 4 mm segment of moderate to severe stenosis of the left M1 segment. The distal left MCA branches are patent. The right M1 segment is patent. There is severe stenosis of an inferior M2 branch shortly after the bifurcation. The distal right MCA branches are patent. Posterior circulation: The V4 segments are patent. The basilar artery is patent. There is severe stenosis of the left P1 segment with occlusion at the level  of the P1/P2 junction (12-144). There is diminutive reconstitution of flow in the distal left PCA (12-117). There is severe focal stenosis at the right P1/P2 junction just inferior to an adjacent traversing vein (12-142). There is reconstitution of flow after the stenosis with more mild multifocal irregularity of the distal branches. Venous sinuses: As permitted by contrast timing, patent. Anatomic variants: None. Review of the MIP images confirms the above findings IMPRESSION: 1. Evolving subacute infarct in the right frontal lobe periventricular white matter in the ACA distribution as seen on prior MRI. No evidence of new infarct or other acute intracranial pathology. 2. Occlusion of the right P2 segment shortly after the anterior communicating artery without appreciable reconstitution of flow. 3. Occlusion of the left PCA at the P1/P2 junction with diminutive reconstitution of flow distally. 4. Other intracranial atherosclerotic disease as above resulting in severe focal stenosis of the right PCA at the P1/P2 junction with reconstitution of flow distally, severe stenosis of an inferior right M2 branch, and moderate stenosis of the intracranial ICAs bilaterally. 5. 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 6. 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  These results were called by telephone at the time of interpretation on 05/13/2021 at 1:39 pm to provider [facility] , who verbally acknowledged these results. Electronically Signed   By: Valetta Mole M.D.   On: 05/13/2021 13:41    PHYSICAL EXAM  Temp:  [97.5 F (36.4 C)-98.3 F (36.8 C)] 98.3 F (36.8 C) (12/18 0731) Pulse Rate:  [63-84] 78 (12/18 0731) Resp:  [14-20] 17 (12/18 0731) BP: (132-189)/(48-106) 157/72 (12/18 0731) SpO2:  [98 %-100 %] 100 % (12/18 0731) Weight:  [61 kg] 61 kg  (12/17 1941)  General - Well nourished, well developed, pleasant elderly Caucasian lady in no apparent distress. Ophthalmologic - fundi not visualized due to noncooperation. Cardiovascular - Regular rhythm and rate.  Mental Status -  Level of arousal and orientation to time, place, and person were intact. Language including expression, naming, repetition, comprehension was assessed and found intact. Attention span and concentration were normal. Fund of Knowledge was assessed and was intact. Impaired memory  Cranial Nerves II - XII - II - Visual field intact OU. III, IV, VI - Extraocular movements intact. V - Facial sensation intact bilaterally. VII - Facial movement intact bilaterally. VIII - Hearing & vestibular intact bilaterally. X - Palate elevates symmetrically. XI - Chin turning & shoulder shrug intact bilaterally. XII - Tongue protrusion intact.  Motor Strength -  Bulk was normal and fasciculations were absent.  Slight drift in left lower extremity.  Baseline strength in right and left upper extremities and right lower extremity .diminished foot tapping on the left. Motor Tone - Muscle tone was assessed at the neck and appendages and was normal. Reflexes - The patients reflexes were symmetrical in all extremities and she had no pathological reflexes. Sensory - Light touch, temperature/pinprick were assessed and were symmetrical.   Coordination - The patient had normal movements in the hands and feet with no ataxia or dysmetria.  Tremor was absent. Gait and Station - deferred.   ASSESSMENT/PLAN Ms. HAVIN HEENEY is a 80 y.o. female with history of progressive memory loss, type 2 diabetes, HCOM, hypertension, hypothyroidism presenting with increased confusion, lethargy, weakness and a general decline in function over the last couple weeks.  She is followed with [facility] neurology for progressive memory loss for 2 years.  Her Namenda and Aricept have been recently changed which  is what her family initially attributed her change to.  She went for an MRI on Wednesday, 12/14 and her daughter noticed that Ms. Weitman was having progressive difficulty getting in and out of the car, which is typically not an issue for her. She has also started using a walker to get around her house. She did have a fall two weeks ago.  The MRI showed a right frontal parasagittal infarct.   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 Occlusion of the left PCA at the P1/P2 junction with diminutive reconstitution of flow distally.  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  40 to 50% stenosis on the right carotid bulb.  MRI- late acute right frontal parasagittal infarct 2D Echo EF 70 to 75%, hyperdynamic function  with a left ventricle.  Bubble study negative LDL 111 HgbA1c 9.2 VTE prophylaxis - Lovenox 40 mg daily aspirin 81 mg daily prior to admission, now on aspirin 81 mg daily and clopidogrel 75 mg daily.  For 3 weeks followed by Plavix alone  therapy recommendations:  pending Disposition:  pending  Hypertension Home meds: None Normotension Hydralazine 10 mg q6hr prn  Hyperlipidemia Home meds:  zetia 10mg , resumed in hospital LDL 111, goal < 70 Add atorvastatin 40mg   High intensity statin  Continue statin at discharge  Diabetes type II Uncontrolled Home meds:   HgbA1c 9.2, goal < 7.0 CBGs Recent Labs    05/13/21 1510 05/13/21 1622 05/14/21 0646  GLUCAP 61* 165* 108*    SSI  Other Stroke Risk Factors Advanced Age >/= 65  Coronary  artery disease  Other Active Problems Progressive memory loss Namenda 10 mg twice daily and Aricept milligrams twice daily Follows with [facility] Med day # 0  Patient seen and examined by NP/APP with MD. MD to update note as needed.   Janine Ores, DNP, FNP-BC [facility] Pager: [phone]  STROKE MD NOTE : I have personally obtained history,examined this patient, reviewed notes, independently viewed imaging studies, participated in medical decision making and plan of care.ROS completed by me personally and pertinent positives fully documented  I have made any additions or clarifications directly to the above note. Agree with note above.  Patient was being evaluated for memory loss and underwent outpatient MRI scan few days ago which showed late acute right parasagittal frontal anterior cerebral artery branch infarct and CT angiogram yesterday which showed right A2 occlusion.  Etiology likely embolic with strong suspicion for underlying paroxysmal A. fib.  Continue cardiac monitoring and if A. fib is not found recommend loop recorder prior to discharge.  Aspirin and Plavix for 3 weeks followed by Plavix alone.  Aggressive risk factor modification.  Long discussion with patient and daughter at bedside and answered questions.  Discussed with Dr.Dahal.  Greater than 50% time during this 35-minute visit was spent in counseling and coordination of care about her embolic stroke and discussion of stroke prevention and treatment and answering questions.  Antony Contras, MD Medical Director [facility] Pager: [phone] 05/14/2021 4:05 PM    To contact Stroke Continuity provider, please refer to [email or web address] After hours, contact General Neurology

## 2021-05-14 NOTE — Evaluation (Signed)
Occupational Therapy Evaluation Patient Details Name: Tina [name] MRN: [id number] DOB: September 02, 1940 Today's Date: 05/14/2021   History of Present Illness Tina [name] is a 80 y.o. female who presented with increased confusion and lethargy x 1 day along with decline in function over the last couple weeks. 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 PMH significant for DM2, HOCM, HTN, hypothyroidism, memory changes.   Clinical Impression   Tina [name] reports being mostly mod I PTA with use of SPC or walker since general decline in function started about 4 weeks ago. She also reports several falls in the past few weeks. She lives in a 2 level home with her husband and a daughter near by who visits daily. Upon evaluation pt was min guard for simple transfer and up to min guard with RW for ADLs; eval limited due to pt being taken down for echo. Pt was Ox4 with slow processing and preferred her daughter communicate PLOF and home set up. Pt's daughter reports a general decline over the past 4 weeks with several falls and "confusion." Tina [name] will benefit from acute OT to progress to mod I baseline. Recommend OP OT as the daughter prefers to drive her vs. Home health services.      Recommendations for follow up therapy are one component of a multi-disciplinary discharge planning process, led by the attending physician.  Recommendations may be updated based on patient status, additional functional criteria and insurance authorization.   Follow Up Recommendations  Outpatient OT    Assistance Recommended at Discharge Frequent or constant Supervision/Assistance  Functional Status Assessment  Patient has had a recent decline in their functional status and demonstrates the ability to make significant improvements in function in a reasonable and predictable amount of time.  Equipment Recommendations  None recommended by OT       Precautions / Restrictions Precautions Precautions: Fall       Mobility Bed Mobility Overal bed mobility: Needs Assistance Bed Mobility: Sit to Supine       Sit to supine: Supervision;HOB elevated   General bed mobility comments: pt required increased time and effort to transfer back into bed    Transfers Overall transfer level: Needs assistance Equipment used: None Transfers: Sit to/from Stand Sit to Stand: Min guard           General transfer comment: limited to 1 sit<>stand only due to echo tech coming to transfer pt.      Balance Overall balance assessment: Needs assistance;History of Falls Sitting-balance support: Feet supported Sitting balance-Leahy Scale: Fair     Standing balance support: Single extremity supported;During functional activity Standing balance-Leahy Scale: Fair                             ADL either performed or assessed with clinical judgement   ADL Overall ADL's : Needs assistance/impaired Eating/Feeding: Independent;Bed level   Grooming: Min guard;Standing   Upper Body Bathing: Set up;Sitting   Lower Body Bathing: Min guard;Sit to/from stand   Upper Body Dressing : Set up;Sitting   Lower Body Dressing: Min guard;Sit to/from stand   Toilet Transfer: Min guard;Ambulation;Regular Toilet;Rolling walker (2 wheels)   Toileting- Clothing Manipulation and Hygiene: Supervision/safety;Sitting/lateral lean       Functional mobility during ADLs: Min guard;Rolling walker (2 wheels) General ADL Comments: limited assessment due to Echo tech coming to transport pt.     Vision Ability to See in Adequate Light: 0 Adequate Patient Visual Report:  No change from baseline Vision Assessment?: No apparent visual deficits     Perception     Praxis      Pertinent Vitals/Pain Pain Assessment: No/denies pain     Hand Dominance Right   Extremity/Trunk Assessment Upper Extremity Assessment Upper Extremity Assessment: Generalized weakness;RUE deficits/detail;LUE deficits/detail RUE Deficits  / Details: strength is generally 4/5 LUE Deficits / Details: strength is geneally 3+/5   Lower Extremity Assessment Lower Extremity Assessment: Defer to PT evaluation   Cervical / Trunk Assessment Cervical / Trunk Assessment: Kyphotic   Communication Communication Communication: No difficulties   Cognition Arousal/Alertness: Awake/alert Behavior During Therapy: Flat affect Overall Cognitive Status: History of cognitive impairments - at baseline                                 General Comments: Pt demonstdrates slow processing & prefers her daughter answer PLOF and home set up questions.     General Comments  VSS on RA, pt's daughter present and supportive & provided much of the pt's history    Exercises     Shoulder Instructions      Home Living Family/patient expects to be discharged to:: Private residence Living Arrangements: Spouse/significant other Available Help at Discharge: Family;Available 24 hours/day (duaghter lives next door & provides meals and checks on pt daily) Type of Home: House Home Access: Stairs to enter [employer] of Steps: 4 Entrance Stairs-Rails: Left;Right Home Layout: Two level;Able to live on main level with bedroom/bathroom (pt's "large freezer" is in the basement. Reports multiple falls hen attempting to go down into the basement.) Alternate Level Stairs-Number of Steps: flight down to basement   Bathroom Shower/Tub: Arts development officer Toilet: Handicapped height     Home Equipment: Standard Walker;Cane - single point;Shower seat;Grab bars - tub/shower;Hand held shower head   Additional Comments: pt's husband is "disabled" wtih 2 bad knees and liver disease; pt dos not have to physically lift him but she does "wait on him for every need" per pt's daughter.      Prior Functioning/Environment Prior Level of Function : Needs assist  Cognitive Assist : ADLs (cognitive)   ADLs (Cognitive): Intermittent  cues Physical Assist : Mobility (physical);ADLs (physical) Mobility (physical): Gait ADLs (physical): Grooming;Bathing;Dressing;Toileting;IADLs Mobility Comments: uses SPC vs walker, has had multiple falls ADLs Comments: no physical assist needed, but has had multiple falls. daughter assists as needed        OT Problem List: Decreased strength;Decreased range of motion;Decreased activity tolerance;Impaired balance (sitting and/or standing);Decreased safety awareness;Decreased knowledge of use of DME or AE;Decreased knowledge of precautions;Pain      OT Treatment/Interventions: Self-care/ADL training;Therapeutic exercise;DME and/or AE instruction;Therapeutic activities;Patient/family education;Balance training    OT Goals(Current goals can be found in the care plan section) Acute Rehab OT Goals Patient Stated Goal: home soon OT Goal Formulation: With patient Time For Goal Achievement: 05/28/21 Potential to Achieve Goals: Good ADL Goals Pt Will Perform Grooming: with modified independence;standing Pt Will Perform Lower Body Bathing: with modified independence;sit to/from stand Pt Will Perform Lower Body Dressing: with modified independence;sit to/from stand Pt Will Transfer to Toilet: with modified independence;ambulating Additional ADL Goal #1: Pt will indep verbalize at least 3 fall prevention strategies to apply in the home setting  OT Frequency: Min 2X/week    AM-PAC OT "6 Clicks" Daily Activity     Outcome Measure Help from another person eating meals?: None Help from another person taking  care of personal grooming?: A Little Help from another person toileting, which includes using toliet, bedpan, or urinal?: A Little Help from another person bathing (including washing, rinsing, drying)?: A Little Help from another person to put on and taking off regular upper body clothing?: A Little Help from another person to put on and taking off regular lower body clothing?: A Little 6  Click Score: 19   End of Session Equipment Utilized During Treatment: Rolling walker (2 wheels) Nurse Communication: Mobility status  Activity Tolerance: Patient tolerated treatment well Patient left: in bed;with call bell/phone within reach  OT Visit Diagnosis: Unsteadiness on feet (R26.81);Other abnormalities of gait and mobility (R26.89);Repeated falls (R29.6);Muscle weakness (generalized) (M62.81);History of falling (Z91.81);Pain;Hemiplegia and hemiparesis Hemiplegia - Right/Left: Left Hemiplegia - dominant/non-dominant: Non-Dominant Hemiplegia - caused by: Cerebral infarction                Time: 0810-0828 OT Time Calculation (min): 18 min Charges:  OT General Charges $OT Visit: 1 Visit OT Evaluation $OT Eval Moderate Complexity: 1 Mod   Tina [name] 05/14/2021, 8:53 AM

## 2021-05-14 NOTE — Discharge Summary (Addendum)
Physician Discharge Summary  Tina [name] [id number] DOB: 11/09/1940 DOA: 05/13/2021  PCP: Kaleen MaskElkins, Wilson Oliver, MD  Admit date: 05/13/2021 Discharge date: 05/15/2021  Admitted From: Home Discharge disposition: Home with outpatient PT   Code Status: Full Code   Discharge Diagnosis:   Principal Problem:   Acute metabolic encephalopathy Active Problems:   Type 2 diabetes mellitus with hypoglycemia without coma, without long-term current use of insulin (HCC)   Hypothyroidism   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 (HCC)   Mixed diabetic hyperlipidemia associated with type 2 diabetes mellitus (HCC)   Essential hypertension   Frequent falls   Dementia without behavioral disturbance (HCC)   Stroke (cerebrum) [facility](HCC)    Chief Complaint  Patient presents with   Altered Mental Status   Brief narrative: Tina Seashorenita S Goffredo is a 80 y.o. female with PMH significant for DM2, HOCM, HTN, hypothyroidism, dementia Patient [facility]17 for increased confusion and lethargy for a day along with decline in function over the last couple of weeks.   For the last 2 years, patient has been [facility] for progressive memory loss.  As part of the work-up, she had an MRI brain done on 12/14 which showed a late acute R frontal parasagittal infarct for which she was started on DAPT with workup planned outpatient.   In the ED, patient was afebrile, hemodynamically stable CT angio of head and neck showed 1. Evolving subacute infarct in the right frontal lobe periventricular white matter in the ACA distribution as seen on prior MRI from 12/14. No evidence of new infarct or other acute intracranial pathology. 2. Multifocal bilateral intracranial small vessel occlusions, stenosis  3. 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  Patient  admitted to hospitalist service. Neurology consultation was obtained.  Subjective: Patient was seen and examined this morning.  Propped up in bed.  Not in distress.  Alert, awake, slow to answer but oriented x3.  Daughter at bedside.  Seen by PT yesterday.  Outpatient PT recommended.  Loop recorder placement recommended by neurology.  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 -Stroke work-up initiated. -A1c 9.2, HDL 72, LDL 111. -Echo with EF 70 to 75%, grade 1 diastolic dysfunction, bubble study negative. -Neurology consult appreciated.  Per neurology note, etiology of stroke suspected to be cardioembolic with underlying paroxysmal A. fib.  Loop recorder placement was recommended.  -Neurology recommends aspirin and Plavix for 3 weeks followed by Plavix alone. -Also started on statin.  To continue Zetia.  Acute metabolic encephalopathy Progressively worsening dementia. -Per history, patient has been showing progressive mental decline over the past 1 to 2 years.  Follow[facility] as an outpatient.   -Recently initiated on Namenda in November.   -Patient reportedly has poor sleep quality, unable to fall or stay asleep. Gets up several times in the middle of night, remains exhausted all day.  She takes care of 2 old dogs and taking them out multiple times in the middle of night. Daughter also reports poor eating habits and patient not taking medications as she should be. Daughter took over meds few weeks ago. Daughter also reports hoarding behavior  over the last few months and that patient sleeps on the couch. Daughter also reports that patient rarely drinks water. -I started her on melatonin nightly to improve  sleep pattern.  Uncontrolled type 2 diabetes mellitus Hypoglycemia -A1c 9.2 on 05/14/2021.  Blood sugar level was low in the 60s yesterday afternoon. -Home meds include glipizide and Actos  -Resume the same at discharge.  Continue to monitor blood sugar level at home. Recent Labs  Lab 05/14/21 2334 05/15/21 0350 05/15/21 0619 05/15/21 1013 05/15/21 1041  GLUCAP 126* 111* 146* 153* 147*     Essential hypertension -Losartan to continue    Hypothyroidism -Continue Synthroid  Frequent falls -Likely due to progressive physical decline with ongoing dementia.   -PT eval appreciated.  Home health PT recommended..    Mobility: PT eval recommended home health PT Living condition: Lives at home with husband Goals of care:   Code Status: Full Code  Nutritional status: Body mass index is 24.6 kg/m.       Discharge Medications:   Allergies as of 05/15/2021       Reactions   Carvedilol Other (See Comments)   Dizziness   Lipitor [atorvastatin Calcium] Other (See Comments)   Pt doesn't recall   Loratadine Other (See Comments)   Unknown reaction   Metformin And Related Other (See Comments)   Unknown reaction   Pravachol Other (See Comments)   Unknown reaction   Zocor [simvastatin] Other (See Comments)   Unknown reaction   Metoprolol Other (See Comments)   Pt states metoprolol made her "very tired."        Medication List     TAKE these medications    aspirin EC 81 MG tablet Take 1 tablet (81 mg total) by mouth every morning for 21 days. Swallow whole.   clopidogrel 75 MG tablet Commonly known as: Plavix Take 1 tablet (75 mg total) by mouth daily.   donepezil 10 MG tablet Commonly known as: ARICEPT Take 10 mg by mouth 2 (two) times daily.   ezetimibe 10 MG tablet Commonly known as: Zetia Take 1 tablet (10 mg total) by mouth daily.   glipiZIDE 10 MG 24 hr tablet Commonly known as: GLUCOTROL XL Take 20 mg by mouth every morning.   losartan 100 MG  tablet Commonly known as: COZAAR Take 100 mg by mouth at bedtime.   melatonin 3 MG Tabs tablet Take 1 tablet (3 mg total) by mouth at bedtime.   memantine 10 MG tablet Commonly known as: Namenda Take 1/2 tab at night for one week. Then take 1/2 tab in the morning and night for one week. Then take 1/2 tab in the morning and 1 tab at night for one week. Then take 1 tab twice a day   pioglitazone 30 MG tablet Commonly known as: ACTOS Take 30 mg by mouth every morning.   rosuvastatin 5 MG tablet Commonly known as: CRESTOR Take 1 tablet (5 mg total) by mouth daily. Start taking on: May 16, 2021   Synthroid 100 MCG tablet Generic drug: levothyroxine Take 1 tablet (100 mcg total) by mouth daily. What changed: when to take this        Wound care:     Discharge Instructions:   Discharge Instructions     Ambulatory referral to Occupational Therapy   Complete by: As directed    Ambulatory referral to Physical Therapy   Complete by: As directed    Call MD for:  difficulty breathing, headache or visual disturbances  Complete by: As directed    Call MD for:  extreme fatigue   Complete by: As directed    Call MD for:  hives   Complete by: As directed    Call MD for:  persistant dizziness or light-headedness   Complete by: As directed    Call MD for:  persistant nausea and vomiting   Complete by: As directed    Call MD for:  severe uncontrolled pain   Complete by: As directed    Call MD for:  temperature >100.4   Complete by: As directed    Diet - low sodium heart healthy   Complete by: As directed    Diet Carb Modified   Complete by: As directed    Discharge instructions   Complete by: As directed    Discharge instructions for diabetes mellitus: Check blood sugar 3 times a day and bedtime at home. If blood sugar running above 200 or less than 70 please call your MD to adjust insulin. If you notice signs and symptoms of hypoglycemia (low blood sugar) like  jitteriness, confusion, thirst, tremor and sweating, please check blood sugar, drink sugary drink/biscuits/sweets to increase sugar level and call MD or return to ER.    Discharge instructions for CHF Check weight daily -preferably same time every day. Restrict fluid intake to 1200 ml daily Restrict salt intake to less than 2 g daily. Call MD if you have one of the following symptoms 1) 3 pound weight gain in 24 hours or 5 pounds in 1 week  2) swelling in the hands, feet or stomach  3) progressive shortness of breath 4) if you have to sleep on extra pillows at night in order to breathe     General discharge instructions:  Follow with Primary MD Leonard Downing, MD in 7 days   Get CBC/BMP checked in next visit within 1 week by PCP or SNF MD. (We routinely change or add medications that can affect your baseline labs and fluid status, therefore we recommend that you get the mentioned basic workup next visit with your PCP, your PCP may decide not to get them or add new tests based on their clinical decision)  On your next visit with your PCP, please get your medicines reviewed and adjusted.  Please request your PCP  to go over all hospital tests, procedures, radiology results at the follow up, please get all Hospital records sent to your PCP by signing hospital release before you go home.  Activity: As tolerated with Full fall precautions use walker/cane & assistance as needed  Avoid using any recreational substances like cigarette, tobacco, alcohol, or non-prescribed drug.  If you experience worsening of your admission symptoms, develop shortness of breath, life threatening emergency, suicidal or homicidal thoughts you must seek medical attention immediately by calling 911 or calling your MD immediately  if symptoms less severe.  You must read complete instructions/literature along with all the possible adverse reactions/side effects for all the medicines you take and that have  been prescribed to you. Take any new medicine only after you have completely understood and accepted all the possible adverse reactions/side effects.   Do not drive, operate heavy machinery, perform activities at heights, swimming or participation in water activities or provide baby sitting services if your were admitted for syncope or siezures until you have seen by Primary MD or a Neurologist and advised to do so again.  Do not drive when taking Pain medications.  Do not take more than  prescribed Pain, Sleep and Anxiety Medications  Wear Seat belts while driving.  Please note You were cared for by a hospitalist during your hospital stay. If you have any questions about your discharge medications or the care you received while you were in the hospital after you are discharged, you can call the unit and asked to speak with the hospitalist on call if the hospitalist that took care of you is not available. Once you are discharged, your primary care physician will handle any further medical issues. Please note that NO REFILLS for any discharge medications will be authorized once you are discharged, as it is imperative that you return to your primary care physician (or establish a relationship with a primary care physician if you do not have one) for your aftercare needs so that they can reassess your need for medications and monitor your lab values.   Increase activity slowly   Complete by: As directed        Follow ups:    Follow-up Information     [facility]. Schedule an appointment as soon as possible for a visit in 1 week(s).   Specialty: Rehabilitation Contact information: [address] [place], [address] [id number] mc [place] [address] [phone]        Kaleen Mask, MD Follow up.   Specialty: Family Medicine Contact information: [address] [place] [address] [phone]         Cassell Clement, MD .    Specialty: Cardiology                Discharge Exam:   Vitals:   05/15/21 0011 05/15/21 0406 05/15/21 0509 05/15/21 0941  BP: (!) 153/67 (!) 182/75 138/77 129/66  Pulse: 70 79 (!) 103 91  Resp: 16 15  20   Temp: 98.3 F (36.8 C) 97.7 F (36.5 C)  98.7 F (37.1 C)  TempSrc: Oral Oral  Oral  SpO2: 99% 99% 99% 99%  Weight:      Height:        Body mass index is 24.6 kg/m.  General exam: Pleasant elderly Caucasian female.  Sitting up in chair.  Not in distress.  Daughter at bedside. Skin: No rashes, lesions or ulcers. HEENT: Atraumatic, normocephalic, no obvious bleeding Lungs: Clear to auscultation bilaterally CVS: Regular rate and rhythm, no murmur GI/Abd soft, nontender, nondistended, bowel sound present CNS: Alert, awake, oriented to place, person and with difficulty to time Psychiatry: Mood appropriate Extremities: No pedal edema, no calf tenderness  Time coordinating discharge: 35 minutes   The results of significant diagnostics from this hospitalization (including imaging, microbiology, ancillary and laboratory) are listed below for reference.    Procedures and Diagnostic Studies:   CT ANGIO HEAD NECK W WO CM  Result Date: 05/13/2021 CLINICAL DATA:  Recent stroke, worsening altered mental status, concern for new stroke EXAM: CT ANGIOGRAPHY HEAD AND NECK TECHNIQUE: Multidetector CT imaging of the head and neck was performed using the standard protocol during bolus administration of intravenous contrast. Multiplanar CT image reconstructions and MIPs were obtained to evaluate the vascular anatomy. Carotid stenosis measurements (when applicable) are obtained utilizing NASCET criteria, using the distal internal carotid diameter as the denominator. CONTRAST:  5mL OMNIPAQUE IOHEXOL 350 MG/ML SOLN COMPARISON:  Brain MRI 05/10/2021 FINDINGS: CT HEAD FINDINGS Brain: There is hypodensity in the right frontal lobe white matter abutting the superior margin of the right  lateral ventricle consistent with evolving subacute infarct as seen on prior MRI. There  is no other evidence of acute infarct. There is no acute intracranial hemorrhage or extra-axial fluid collection. There is mild parenchymal volume loss with prominence of the extra-axial CSF spaces overlying the bilateral cerebral hemispheres. The ventricles are normal in size. There is no mass lesion.  There is no midline shift. Vascular: There is mild calcification of the intracranial ICAs. No dense vessel is seen. Skull: Normal. Negative for fracture or focal lesion. Sinuses: Postsurgical changes are seen in the sinuses. Orbits: Bilateral lens implants are in place. The globes and orbits are otherwise unremarkable. Review of the MIP images confirms the above findings CTA NECK FINDINGS Aortic arch: There is mild calcified atherosclerotic plaque in the aortic arch. The origins of the major branch vessels are patent. Right carotid system: The right common carotid artery is patent. There is calcified atherosclerotic plaque in the proximal right internal carotid artery resulting in up to approximately 40-50% stenosis. The distal right internal carotid artery is patent. The right external carotid artery is patent. There is no evidence of dissection or aneurysm. Left carotid system: The left common carotid artery is patent. There is mild calcified atherosclerotic plaque in the proximal left internal carotid artery without hemodynamically significant stenosis or occlusion. The distal left internal carotid artery is patent. The left external carotid artery is patent. There is no dissection or aneurysm. Vertebral arteries: The vertebral arteries are patent, without hemodynamically significant stenosis, occlusion, dissection, or aneurysm. Skeleton: There is mild multilevel degenerative change in the cervical spine. There is no visible canal hematoma. There is no acute osseous abnormality or aggressive osseous lesion. Other neck: The  thyroid is heterogeneous with partially calcified nodules, the largest measuring up to 1.2 cm. The soft tissues are otherwise unremarkable. Upper chest: The imaged lung apices are clear. Review of the MIP images confirms the above findings CTA HEAD FINDINGS Anterior circulation: There is calcified atherosclerotic plaque in the bilateral intracranial ICAs resulting in up to moderate stenosis of the right ophthalmic segment and moderate stenosis of the left supraclinoid segment. There is a 2 mm inferiorly directed outpouching arising from the left supraclinoid segment (15-152). No definite vessel is seen emanating from the tip of this outpouching. The bilateral A1 segments are patent. The anterior communicating artery is patent. The right A2 segment is occluded proximally without appreciable reconstitution of flow. The left ACA is patent throughout. There is an approximately 4 mm segment of moderate to severe stenosis of the left M1 segment. The distal left MCA branches are patent. The right M1 segment is patent. There is severe stenosis of an inferior M2 branch shortly after the bifurcation. The distal right MCA branches are patent. Posterior circulation: The V4 segments are patent. The basilar artery is patent. There is severe stenosis of the left P1 segment with occlusion at the level of the P1/P2 junction (12-144). There is diminutive reconstitution of flow in the distal left PCA (12-117). There is severe focal stenosis at the right P1/P2 junction just inferior to an adjacent traversing vein (12-142). There is reconstitution of flow after the stenosis with more mild multifocal irregularity of the distal branches. Venous sinuses: As permitted by contrast timing, patent. Anatomic variants: None. Review of the MIP images confirms the above findings IMPRESSION: 1. Evolving subacute infarct in the right frontal lobe periventricular white matter in the ACA distribution as seen on prior MRI. No evidence of new infarct or  other acute intracranial pathology. 2. Occlusion of the right P2 segment shortly after the anterior communicating artery without  appreciable reconstitution of flow. 3. Occlusion of the left PCA at the P1/P2 junction with diminutive reconstitution of flow distally. 4. Other intracranial atherosclerotic disease as above resulting in severe focal stenosis of the right PCA at the P1/P2 junction with reconstitution of flow distally, severe stenosis of an inferior right M2 branch, and moderate stenosis of the intracranial ICAs bilaterally. 5. 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 6. 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 These results were called by telephone at the time of interpretation on 05/13/2021 at 1:39 pm to provider [facility] , who verbally acknowledged these results. Electronically Signed   By: Valetta Mole M.D.   On: 05/13/2021 13:41   ECHOCARDIOGRAM COMPLETE BUBBLE STUDY  Result Date: 05/14/2021    ECHOCARDIOGRAM REPORT   Patient Name:   NAIOMI RODEHEAVER Date of Exam: 05/14/2021 Medical Rec #:  [id number]     Height:       62.0 in Accession #:    [id number]    Weight:       134.5 lb Date of Birth:  1940/06/26      BSA:          1.615 m Patient Age:    31 years      BP:           157/72 mmHg Patient Gender: F             HR:           83 bpm. Exam Location:  Inpatient Procedure: 2D Echo and Saline Contrast Bubble Study Indications:    stroke  History:        Patient has prior history of Echocardiogram examinations, most                 recent 02/29/2016. Risk Factors:Hypertension, Diabetes and                 Dyslipidemia.  Sonographer:    Johny Chess RDCS Referring Phys: [id number] [place]  1. Left ventricular ejection fraction, by estimation, is 70 to 75%. The left ventricle has hyperdynamic function. The left  ventricle has no regional wall motion abnormalities. Left ventricular diastolic parameters are consistent with Grade I diastolic dysfunction (impaired relaxation). Elevated left ventricular end-diastolic pressure.  2. Right ventricular systolic function is normal. The right ventricular size is normal. There is normal pulmonary artery systolic pressure.  3. The mitral valve is normal in structure. Trivial mitral valve regurgitation.  4. The aortic valve is grossly normal. Aortic valve regurgitation is not visualized. Mild aortic valve stenosis.  5. Agitated saline contrast bubble study was negative, with no evidence of any interatrial shunt. FINDINGS  Left Ventricle: Left ventricular ejection fraction, by estimation, is 70 to 75%. The left ventricle has hyperdynamic function. The left ventricle has no regional wall motion abnormalities. The left ventricular internal cavity size was small. There is no  left ventricular hypertrophy. Left ventricular diastolic parameters are consistent with Grade I diastolic dysfunction (impaired relaxation). Elevated left ventricular end-diastolic pressure. Right Ventricle: The right ventricular size is normal. Right vetricular wall thickness was not well visualized. Right ventricular systolic function is normal. There is normal pulmonary artery systolic pressure. The tricuspid regurgitant velocity is 2.64 m/s, and with an assumed right atrial pressure of 3 mmHg, the estimated right ventricular systolic pressure is 123456 mmHg. Left Atrium: Left atrial  size was normal in size. Right Atrium: Right atrial size was normal in size. Pericardium: There is no evidence of pericardial effusion. Mitral Valve: The mitral valve is normal in structure. Trivial mitral valve regurgitation. Tricuspid Valve: The tricuspid valve is grossly normal. Tricuspid valve regurgitation is trivial. Aortic Valve: The aortic valve is grossly normal. Aortic valve regurgitation is not visualized. Mild aortic stenosis  is present. Aortic valve mean gradient measures 11.5 mmHg. Aortic valve peak gradient measures 20.9 mmHg. Aortic valve area, by VTI measures 2.68 cm. Pulmonic Valve: The pulmonic valve was normal in structure. Pulmonic valve regurgitation is not visualized. Aorta: The aortic root and ascending aorta are structurally normal, with no evidence of dilitation. IAS/Shunts: No atrial level shunt detected by color flow Doppler. Agitated saline contrast was given intravenously to evaluate for intracardiac shunting. Agitated saline contrast bubble study was negative, with no evidence of any interatrial shunt.  LEFT VENTRICLE PLAX 2D LVIDd:         3.60 cm   Diastology LVIDs:         2.10 cm   LV e' medial:    7.94 cm/s LV PW:         0.80 cm   LV E/e' medial:  17.3 LV IVS:        0.90 cm   LV e' lateral:   9.03 cm/s LVOT diam:     1.90 cm   LV E/e' lateral: 15.2 LV SV:         113 LV SV Index:   70 LVOT Area:     2.84 cm  RIGHT VENTRICLE             IVC RV S prime:     13.80 cm/s  IVC diam: 1.20 cm TAPSE (M-mode): 1.9 cm LEFT ATRIUM             Index        RIGHT ATRIUM           Index LA diam:        3.70 cm 2.29 cm/m   RA Area:     11.80 cm LA Vol (A2C):   50.2 ml 31.08 ml/m  RA Volume:   27.00 ml  16.72 ml/m LA Vol (A4C):   52.8 ml 32.69 ml/m LA Biplane Vol: 52.8 ml 32.69 ml/m  AORTIC VALVE AV Area (Vmax):    2.51 cm AV Area (Vmean):   2.57 cm AV Area (VTI):     2.68 cm AV Vmax:           228.50 cm/s AV Vmean:          158.000 cm/s AV VTI:            0.422 m AV Peak Grad:      20.9 mmHg AV Mean Grad:      11.5 mmHg LVOT Vmax:         202.00 cm/s LVOT Vmean:        143.000 cm/s LVOT VTI:          0.399 m LVOT/AV VTI ratio: 0.95  AORTA Ao Root diam: 2.70 cm Ao Asc diam:  2.70 cm MITRAL VALVE                TRICUSPID VALVE MV Area (PHT): 4.29 cm     TR Peak grad:   27.9 mmHg MV Decel Time: 177 msec     TR Vmax:        264.00 cm/s MV E velocity:  137.00 cm/s MV A velocity: 201.00 cm/s  SHUNTS MV E/A ratio:  0.68          Systemic VTI:  0.40 m                             Systemic Diam: 1.90 cm Mertie Moores MD Electronically signed by Mertie Moores MD Signature Date/Time: 05/14/2021/11:59:59 AM    Final      Labs:   Basic Metabolic Panel: Recent Labs  Lab 05/13/21 1136 05/14/21 0439  NA 138 138  K 4.3 3.8  CL 103 106  CO2 31 26  GLUCOSE 139* 123*  BUN 20 14  CREATININE 0.97 0.72  CALCIUM 8.9 8.8*  MG 1.9 1.9   GFR Estimated Creatinine Clearance: 46.6 mL/min (by C-G formula based on SCr of 0.72 mg/dL). Liver Function Tests: Recent Labs  Lab 05/13/21 1136 05/14/21 0439  AST 12* 15  ALT 11 12  ALKPHOS 120 108  BILITOT 0.3 0.5  PROT 6.6 5.9*  ALBUMIN 3.9 3.2*   No results for input(s): LIPASE, AMYLASE in the last 168 hours. Recent Labs  Lab 05/13/21 1136  AMMONIA 29   Coagulation profile Recent Labs  Lab 05/13/21 1136  INR 1.0    CBC: Recent Labs  Lab 05/13/21 1136 05/14/21 0439  WBC 4.4 4.3  NEUTROABS 2.7 2.7  HGB 12.2 11.9*  HCT 39.0 37.3  MCV 80.2 79.0*  PLT 278 263   Cardiac Enzymes: No results for input(s): CKTOTAL, CKMB, CKMBINDEX, TROPONINI in the last 168 hours. BNP: Invalid input(s): POCBNP CBG: Recent Labs  Lab 05/14/21 2334 05/15/21 0350 05/15/21 0619 05/15/21 1013 05/15/21 1041  GLUCAP 126* 111* 146* 153* 147*   D-Dimer No results for input(s): DDIMER in the last 72 hours. Hgb A1c Recent Labs    05/14/21 0439  HGBA1C 9.2*   Lipid Profile Recent Labs    05/14/21 0439  CHOL 195  HDL 72  LDLCALC 111*  TRIG 62  CHOLHDL 2.7   Thyroid function studies No results for input(s): TSH, T4TOTAL, T3FREE, THYROIDAB in the last 72 hours.  Invalid input(s): FREET3 Anemia work up No results for input(s): VITAMINB12, FOLATE, FERRITIN, TIBC, IRON, RETICCTPCT in the last 72 hours. Microbiology Recent Results (from the past 240 hour(s))  Resp Panel by RT-PCR (Flu A&B, Covid) Nasopharyngeal Swab     Status: None   Collection Time: 05/13/21   3:07 PM   Specimen: Nasopharyngeal Swab; Nasopharyngeal(NP) swabs in vial transport medium  Result Value Ref Range Status   SARS Coronavirus 2 by RT PCR NEGATIVE NEGATIVE Final    Comment: (NOTE) SARS-CoV-2 target nucleic acids are NOT DETECTED.  The SARS-CoV-2 RNA is generally detectable in upper respiratory specimens during the acute phase of infection. The lowest concentration of SARS-CoV-2 viral copies this assay can detect is 138 copies/mL. A negative result does not preclude SARS-Cov-2 infection and should not be used as the sole basis for treatment or other patient management decisions. A negative result may occur with  improper specimen collection/handling, submission of specimen other than nasopharyngeal swab, presence of viral mutation(s) within the areas targeted by this assay, and inadequate number of viral copies(<138 copies/mL). A negative result must be combined with clinical observations, patient history, and epidemiological information. The expected result is Negative.  Fact Sheet for Patients:  EntrepreneurPulse.com.au  Fact Sheet for Healthcare Providers:  IncredibleEmployment.be  This test is no t yet approved or cleared by the [place]  FDA and  has been authorized for detection and/or diagnosis of SARS-CoV-2 by FDA under an Emergency Use Authorization (EUA). This EUA will remain  in effect (meaning this test can be used) for the duration of the COVID-19 declaration under Section 564(b)(1) of the Act, 21 U.S.C.section 360bbb-3(b)(1), unless the authorization is terminated  or revoked sooner.       Influenza A by PCR NEGATIVE NEGATIVE Final   Influenza B by PCR NEGATIVE NEGATIVE Final    Comment: (NOTE) The Xpert Xpress SARS-CoV-2/FLU/RSV plus assay is intended as an aid in the diagnosis of influenza from Nasopharyngeal swab specimens and should not be used as a sole basis for treatment. Nasal washings and aspirates  are unacceptable for Xpert Xpress SARS-CoV-2/FLU/RSV testing.  Fact Sheet for Patients: EntrepreneurPulse.com.au  Fact Sheet for Healthcare Providers: IncredibleEmployment.be  This test is not yet approved or cleared by the [place] FDA and has been authorized for detection and/or diagnosis of SARS-CoV-2 by FDA under an Emergency Use Authorization (EUA). This EUA will remain in effect (meaning this test can be used) for the duration of the COVID-19 declaration under Section 564(b)(1) of the Act, 21 U.S.C. section 360bbb-3(b)(1), unless the authorization is terminated or revoked.  Performed at [facility], [address] [place], [place] [address]      Signed: Terrilee Croak  [facility] 05/15/2021, 11:10 AM

## 2021-05-15 ENCOUNTER — Inpatient Hospital Stay (HOSPITAL_COMMUNITY): Payer: Medicare Other

## 2021-05-15 ENCOUNTER — Other Ambulatory Visit (HOSPITAL_COMMUNITY): Payer: Self-pay

## 2021-05-15 DIAGNOSIS — E785 Hyperlipidemia, unspecified: Secondary | ICD-10-CM

## 2021-05-15 DIAGNOSIS — E119 Type 2 diabetes mellitus without complications: Secondary | ICD-10-CM

## 2021-05-15 DIAGNOSIS — R4182 Altered mental status, unspecified: Secondary | ICD-10-CM | POA: Diagnosis not present

## 2021-05-15 DIAGNOSIS — I639 Cerebral infarction, unspecified: Secondary | ICD-10-CM

## 2021-05-15 LAB — GLUCOSE, CAPILLARY
Glucose-Capillary: 111 mg/dL — ABNORMAL HIGH (ref 70–99)
Glucose-Capillary: 146 mg/dL — ABNORMAL HIGH (ref 70–99)
Glucose-Capillary: 153 mg/dL — ABNORMAL HIGH (ref 70–99)
Glucose-Capillary: 147 mg/dL — ABNORMAL HIGH (ref 70–99)
Glucose-Capillary: 88 mg/dL (ref 70–99)
Glucose-Capillary: 133 mg/dL — ABNORMAL HIGH (ref 70–99)
Glucose-Capillary: 236 mg/dL — ABNORMAL HIGH (ref 70–99)

## 2021-05-15 MED ORDER — SODIUM CHLORIDE 0.9 % IV SOLN
500.0000 mL | INTRAVENOUS | Status: DC
  Administered 2021-05-15 (×2): 500 mL via INTRAVENOUS

## 2021-05-15 MED ORDER — ASPIRIN 81 MG PO TBEC
81.0000 mg | DELAYED_RELEASE_TABLET | Freq: Every morning | ORAL | 0 refills | Status: DC
  Filled 2021-05-15: qty 21, 21d supply, fill #0

## 2021-05-15 MED ORDER — MELATONIN 3 MG PO TABS
3.0000 mg | ORAL_TABLET | Freq: Every day | ORAL | Status: DC
  Administered 2021-05-15 – 2021-05-30 (×15): 3 mg via ORAL
  Filled 2021-05-15 (×16): qty 1

## 2021-05-15 MED ORDER — ROSUVASTATIN CALCIUM 5 MG PO TABS
5.0000 mg | ORAL_TABLET | Freq: Every day | ORAL | 0 refills | Status: AC
  Filled 2021-05-15: qty 90, 90d supply, fill #0

## 2021-05-15 MED ORDER — MELATONIN 3 MG PO TABS
3.0000 mg | ORAL_TABLET | Freq: Every day | ORAL | 0 refills | Status: DC
  Filled 2021-05-15: qty 90, 90d supply, fill #0

## 2021-05-15 NOTE — TOC Initial Note (Signed)
Transition of Care [facility]) - Initial/Assessment Note    Patient Details  Name: LOUNA ROTHGEB MRN: [id number] Date of Birth: 03-Aug-1940  Transition of [facility]) CM/SW Contact:    Kermit Balo, RN Phone Number: 05/15/2021, 3:13 PM  Clinical Narrative:                 CM met with the patient and her daughter at the bedside. Daughter requesting SNF rehab. CM updated the daughter that patient is doing to physically well for SNF rehab. CM did ask OT to re-assess. OT has found that she has changes and a CT head is to be repeated. OT now recommending SNF rehab. CM is awaiting PT eval and then will re-discuss d/c plans with patient and daughter.  Patient lives with spouse who she assists with ADL's. Daughter can provide some assistance at home and can take patient to her house as necessary.  TOC following.  Expected Discharge Plan: Skilled Nursing Facility Barriers to Discharge: Continued Medical Work up   Patient Goals and CMS Choice Patient states their goals for this hospitalization and ongoing recovery are:: return home CMS Medicare.gov Compare Post Acute Care list provided to:: Patient Represenative (must comment) Choice offered to / list presented to : Adult Children, Patient  Expected Discharge Plan and Services Expected Discharge Plan: Skilled Nursing Facility   Discharge Planning Services: CM Consult Post Acute Care Choice: Skilled Nursing Facility Living arrangements for the past 2 months: Single Family Home Expected Discharge Date: 05/15/21                                    Prior Living Arrangements/Services Living arrangements for the past 2 months: Single Family Home Lives with:: Spouse Patient language and need for interpreter reviewed:: Yes Do you feel safe going back to the place where you live?: Yes      Need for Family Participation in Patient Care: Yes (Comment) Care giver support system in place?: Yes (comment) Current home services: DME (has  walker) Criminal Activity/Legal Involvement Pertinent to Current Situation/Hospitalization: No - Comment as needed  Activities of Daily Living Home Assistive Devices/Equipment: None ADL Screening (condition at time of admission) Patient's cognitive ability adequate to safely complete daily activities?: No Is the patient deaf or have difficulty hearing?: Yes Does the patient have difficulty seeing, even when wearing glasses/contacts?: No Does the patient have difficulty concentrating, remembering, or making decisions?: Yes Patient able to express need for assistance with ADLs?: Yes Does the patient have difficulty dressing or bathing?: No Independently performs ADLs?: No Communication: Independent Dressing (OT): Independent Grooming: Independent Feeding: Independent Bathing: Needs assistance Is this a change from baseline?: Change from baseline, expected to last <3 days Toileting: Needs assistance Is this a change from baseline?: Change from baseline, expected to last <3 days In/Out Bed: Independent Walks in Home: Independent Does the patient have difficulty walking or climbing stairs?: No Weakness of Legs: None Weakness of Arms/Hands: None  Permission Sought/Granted                  Emotional Assessment Appearance:: Appears stated age Attitude/Demeanor/Rapport: Engaged Affect (typically observed): Accepting Orientation: : Oriented to Self, Oriented to Place, Oriented to Situation   Psych Involvement: No (comment)  Admission diagnosis:  Stroke (cerebrum) (HCC) [I63.9] Cerebrovascular accident (CVA) due to occlusion of right anterior cerebral artery [facility]) [I63.521] Patient Active Problem List   Diagnosis Date Noted  Stroke (cerebrum) ([place]) 05/14/2021   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 ([place]) 05/13/2021   Mixed diabetic hyperlipidemia associated with type 2 diabetes mellitus ([place]) 05/13/2021   Essential hypertension 56/31/4970   Acute metabolic  encephalopathy 26/37/8588   Frequent falls 05/13/2021   Dementia without behavioral disturbance ([place]) 05/13/2021   Mixed hyperlipidemia 04/03/2018   Right carotid bruit 12/16/2013   Hypertensive urgency 07/01/2012   Chest pain 07/01/2012   Dyslipidemia 01/10/2012   Benign hypertensive heart disease without heart failure 12/11/2010   Hypothyroidism 12/11/2010   Aortic valve disease 12/11/2010   Sinusitis acute 12/11/2010   Type 2 diabetes mellitus with hypoglycemia without coma, without long-term current use of insulin ([place]) 04/17/2010   HEMORRHOIDS-EXTERNAL 04/17/2010   PCP:  Leonard Downing, MD Pharmacy:   [facility] [place] [address] Phone: [phone] Fax: [phone]  [facility] [address] [place] [address] Phone: [phone] Fax: [phone]     Social Determinants of Health (SDOH) Interventions    Readmission Risk Interventions No flowsheet data found.

## 2021-05-15 NOTE — Evaluation (Signed)
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 ([place]) 05/13/2021   Mixed diabetic hyperlipidemia associated with type 2 diabetes mellitus ([place]) 05/13/2021   Essential hypertension 28/41/3244   Acute metabolic encephalopathy 05/30/7251   Frequent falls 05/13/2021   Dementia without behavioral disturbance ([place]) 05/13/2021   Mixed hyperlipidemia 04/03/2018   Right carotid bruit 12/16/2013   Hypertensive urgency 07/01/2012   Chest pain 07/01/2012   Dyslipidemia 01/10/2012   Benign hypertensive heart disease without heart failure 12/11/2010   Hypothyroidism 12/11/2010   Aortic valve disease 12/11/2010   Sinusitis acute 12/11/2010   Type 2 diabetes mellitus with hypoglycemia without coma, without long-term current use of insulin ([place]) 04/17/2010   HEMORRHOIDS-EXTERNAL 04/17/2010   Past Medical History:  Past Medical History:  Diagnosis Date   Carotid artery disease ([place])    a. Carotid US 7/15 - bilat ICA 40-59% >> FU 1 year //  b. Carotid US 10/17: bilat ICA 1-39% >> FU PRN (will get repeat in 2 years)    Diabetes mellitus    Heart murmur    History of nuclear stress test    a. Myoview 6/15 - Normal stress nuclear study. LV Ejection Fraction: 77%   HOCM (hypertrophic obstructive cardiomyopathy) ([place])    a. Echo 7/15 - Mild concentric LVH, vigorous LVF, EF 65-70%, LVOT dynamic obstruction at rest with peak gradient 20 mmHg, grade 1 diastolic dysfunction, mild SAM, mild to moderate MR // b. Echo 10/17: EF 55-60%, normal wall motion, grade 2 diastolic dysfunction, MAC, mild to moderate MR, mild LAE (LVOT 9 mmHg)   Hyperlipidemia    Hypertension    Hypothyroidism    Memory changes     Mitral regurgitation    Past Surgical History:  Past Surgical History:  Procedure Laterality Date   ABDOMINAL HYSTERECTOMY     BRAIN SURGERY     BREAST SURGERY     tumors right breast    CARDIOVASCULAR STRESS TEST  03/06/2007   EF 86%   COLONOSCOPY     GOITER REMOVAL  1972   SINUS EXPLORATION     US ECHOCARDIOGRAPHY  10/10/2006   EF 55-60%   HPI:  80 y.o. female who presented with increased confusion and lethargy x 1 day along with decline in function over the last couple weeks. 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 D/C was planned for 12/19 but presented that day with increased physical deficits; imaging pending. PMH significant for DM2, HOCM, HTN, hypothyroidism, memory changes.   Assessment / Plan / Recommendation Clinical Impression  Pt participated in the [facility] Mental Status Exam, achieving a score of 17/30.  Points lost were in areas of delayed recall with interference, registration and digit span, visual spatial construction (clock-drawing: numbers written in reverse order and hands were set inaccurately), executive functioning and extrapolation. Ms. Screws verbalized that she did "pretty good" on the exam.  Language, speech, and fluency are WNL.  Per chart review, pt has had some memory deficits at baseline - will f/u with her daughter, Delrae Rend, to gather more information about baseline cognition. SLP will follow while in acute care; rec f/u post-D/C.    SLP Assessment  SLP Recommendation/Assessment: Patient needs continued [facility] [facility]  SLP Visit Diagnosis: Cognitive communication deficit (R41.841)    Recommendations for follow up therapy are one component of a multi-disciplinary discharge planning process, led by the attending physician.  Recommendations may be updated based on patient status, additional functional criteria and insurance authorization.    Follow Up Recommendations  Other (comment) (tba)    Assistance  Recommended at Discharge  Frequent or constant Supervision/Assistance  Functional Status Assessment Patient has had a recent decline in their functional status and demonstrates the ability to make significant improvements in function in a reasonable and predictable amount of time.  Frequency and Duration min 2x/week  2 weeks      SLP Evaluation Cognition  Overall Cognitive Status: Impaired/Different from baseline Arousal/Alertness: Awake/alert Orientation Level: Oriented to person;Oriented to place;Disoriented to time Attention: Selective Selective Attention: Impaired Selective Attention Impairment: Verbal basic Memory: Impaired Memory Impairment: Retrieval deficit Problem Solving: Impaired Safety/Judgment: Impaired       Comprehension  Auditory Comprehension Overall Auditory Comprehension: Appears within functional limits for tasks assessed    Expression Expression Primary Mode of Expression: Verbal Verbal Expression Overall Verbal Expression: Appears within functional limits for tasks assessed Written Expression Dominant Hand: Right Written Expression: Not tested   Oral / Motor  Oral Motor/Sensory Function Overall Oral Motor/Sensory Function: Within functional limits Motor Speech Overall Motor Speech: Appears within functional limits for tasks assessed           Megan Hayduk L. Tivis Ringer, [place] CCC/SLP Acute Rehabilitation Services Office number [phone] Pager [phone]  Juan Quam Laurice 05/15/2021, 11:41 AM

## 2021-05-15 NOTE — Plan of Care (Signed)
Pt is alert oriented x 3, periods of confusion,reoriented. . Ambulatory, stand by assist. Pt anxious and restless through night. Pt c/o dizziness, BP checked greater than 180 systolic. PRN hydralazine given, with effective results. Prn tylenol given for headache.     Problem: Education: Goal: Knowledge of General Education information will improve Description: Including pain rating scale, medication(s)/side effects and non-pharmacologic comfort measures Outcome: Progressing   Problem: Health Behavior/Discharge Planning: Goal: Ability to manage health-related needs will improve Outcome: Progressing   Problem: Clinical Measurements: Goal: Diagnostic test results will improve Outcome: Progressing   Problem: Activity: Goal: Risk for activity intolerance will decrease Outcome: Progressing   Problem: Nutrition: Goal: Adequate nutrition will be maintained Outcome: Progressing   Problem: Elimination: Goal: Will not experience complications related to bowel motility Outcome: Progressing Goal: Will not experience complications related to urinary retention Outcome: Progressing   Problem: Safety: Goal: Ability to remain free from injury will improve Outcome: Progressing   Problem: Skin Integrity: Goal: Risk for impaired skin integrity will decrease Outcome: Progressing

## 2021-05-15 NOTE — Consult Note (Addendum)
ELECTROPHYSIOLOGY CONSULT NOTE  Patient ID: Tina [name] MRN: [id number], DOB/AGE: 1941/02/18   Admit date: 05/13/2021 Date of Consult: 05/15/2021  Primary Physician: Leonard Downing, MD Primary Cardiologist: Dr. Acie Fredrickson, (last Nov 2021) Reason for Consultation: Cryptogenic stroke ; recommendations regarding Implantable Loop Recorder requested by Dr. Leonie Man  History of Present Illness Tina [name] was admitted on 05/13/2021 with confusion and lethargy and weeks of functional decline, work up out patient included an MRI brain on 05/10/21 as part of her workup which demonstrated a late acute R frontal parasagittal infarct for which she was started on DAPT with workup planned outpatient.  Though with ongoing symptoms family brought her in and she was admitted  PMHx includes: HTN, HLD, DM, PVD (carotid disease), hypothyroidism, HOCM  She last saw cardiology, Dr. 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  she has undergone workup for stroke including echocardiogram and carotid dopplers.  The patient has been monitored on telemetry which has demonstrated sinus rhythm with no arrhythmias.    Neurology has deferred TEE  Echocardiogram this admission demonstrated    IMPRESSIONS   1. Left ventricular ejection fraction, by estimation, is 70 to 75%. The  left ventricle has hyperdynamic function. The left ventricle has no  regional wall motion abnormalities. Left ventricular diastolic parameters  are consistent with Grade I diastolic  dysfunction (impaired relaxation). Elevated left ventricular end-diastolic  pressure.   2. Right ventricular systolic function is normal. The right ventricular  size is normal. There is normal pulmonary artery systolic pressure.    3. The mitral valve is normal in structure. Trivial mitral valve  regurgitation.   4. The aortic valve is grossly normal. Aortic valve regurgitation is not  visualized. Mild aortic valve stenosis.   5. Agitated saline contrast bubble study was negative, with no evidence  of any interatrial shunt.   Lab work is reviewed.  Prior to admission, the patient denies chest pain, shortness of breath, dizziness, palpitations, or syncope.   PT is with the patient and notes she is much weaker generally and particularly L leg, neurology was called to bedside, Dr. Leonie Man is with her, holding discharge.     Past Medical History:  Diagnosis Date   Carotid artery disease ([place])    a. Carotid US 7/15 - bilat ICA 40-59% >> FU 1 year //  b. Carotid US 10/17: bilat ICA 1-39% >> FU PRN (Laquasha Groome get repeat in 2 years)    Diabetes mellitus    Heart murmur    History of nuclear stress test    a. Myoview 6/15 - Normal stress nuclear study. LV Ejection Fraction: 77%   HOCM (hypertrophic obstructive cardiomyopathy) ([place])    a. Echo 7/15 - Mild concentric LVH, vigorous LVF, EF 65-70%, LVOT dynamic obstruction at rest with peak gradient 20 mmHg, grade 1 diastolic dysfunction, mild SAM, mild to moderate MR // b. Echo 10/17: EF 55-60%, normal wall motion, grade 2 diastolic dysfunction, MAC, mild to moderate MR, mild LAE (LVOT 9 mmHg)   Hyperlipidemia    Hypertension    Hypothyroidism    Memory changes    Mitral regurgitation      Surgical History:  Past Surgical History:  Procedure Laterality Date   ABDOMINAL HYSTERECTOMY     BRAIN SURGERY  BREAST SURGERY     tumors right breast    CARDIOVASCULAR STRESS TEST  03/06/2007   EF 86%   COLONOSCOPY     GOITER REMOVAL  1972   SINUS EXPLORATION     US ECHOCARDIOGRAPHY  10/10/2006   EF 55-60%     Medications Prior to Admission  Medication Sig Dispense Refill Last Dose   aspirin EC 81 MG tablet Take 81 mg by mouth every morning. Swallow whole.   05/13/2021 at  am   clopidogrel (PLAVIX) 75 MG tablet Take 1 tablet (75 mg total) by mouth daily. 30 tablet 2 05/13/2021 at am   glipiZIDE (GLUCOTROL XL) 10 MG 24 hr tablet Take 20 mg by mouth every morning.   05/13/2021 at am   losartan (COZAAR) 100 MG tablet Take 100 mg by mouth at bedtime.   05/12/2021 at pm   pioglitazone (ACTOS) 30 MG tablet Take 30 mg by mouth every morning.   05/13/2021 at am   SYNTHROID 100 MCG tablet Take 1 tablet (100 mcg total) by mouth daily. (Patient taking differently: Take 100 mcg by mouth every morning.) 90 tablet 1 05/13/2021 at am   donepezil (ARICEPT) 10 MG tablet Take 10 mg by mouth 2 (two) times daily. (Patient not taking: Reported on 05/13/2021)   Not Taking   ezetimibe (ZETIA) 10 MG tablet Take 1 tablet (10 mg total) by mouth daily. (Patient not taking: Reported on 05/13/2021) 30 tablet 2 Not Taking   memantine (NAMENDA) 10 MG tablet Take 1/2 tab at night for one week. Then take 1/2 tab in the morning and night for one week. Then take 1/2 tab in the morning and 1 tab at night for one week. Then take 1 tab twice a day (Patient not taking: Reported on 05/13/2021) 120 tablet 2 Not Taking    Inpatient Medications:   aspirin EC  81 mg Oral q morning   clopidogrel  75 mg Oral Daily   enoxaparin (LOVENOX) injection  40 mg Subcutaneous Q24H   insulin aspart  0-6 Units Subcutaneous TID AC & HS   levothyroxine  100 mcg Oral Q0600   losartan  100 mg Oral QHS   melatonin  3 mg Oral QHS   multivitamin with minerals  1 tablet Oral Daily   rosuvastatin  5 mg Oral Daily    Allergies:  Allergies  Allergen Reactions   Carvedilol Other (See Comments)    Dizziness   Lipitor [Atorvastatin Calcium] Other (See Comments)    Pt doesn't recall   Loratadine Other (See Comments)    Unknown reaction   Metformin And Related Other (See Comments)    Unknown reaction   Pravachol Other (See Comments)    Unknown reaction   Zocor [Simvastatin] Other (See Comments)    Unknown reaction    Metoprolol Other (See Comments)    Pt states metoprolol made her "very tired."    Social History   Socioeconomic History   Marital status: Married    Spouse name: Not on file   Number of children: Not on file   Years of education: Not on file   Highest education level: Not on file  Occupational History   Not on file  Tobacco Use   Smoking status: Never   Smokeless tobacco: Never  Vaping Use   Vaping Use: Never used  Substance and Sexual Activity   Alcohol use: No   Drug use: No   Sexual activity: Not Currently  Other Topics Concern   Not on  file  Social History Narrative   Not on file   Social Determinants of Health   Financial Resource Strain: Not on file  Food Insecurity: Not on file  Transportation Needs: Not on file  Physical Activity: Not on file  Stress: Not on file  Social Connections: Not on file  Intimate Partner Violence: Not on file     Family History  Problem Relation Age of Onset   Heart disease Mother    Healthy Father    Breast cancer Neg Hx       Review of Systems: All other systems reviewed and are otherwise negative except as noted above.  Physical Exam: Vitals:   05/15/21 0011 05/15/21 0406 05/15/21 0509 05/15/21 0941  BP: (!) 153/67 (!) 182/75 138/77 129/66  Pulse: 70 79 (!) 103 91  Resp: _0 Temp: 98.3 F (36.8 C) 97.7 F (36.5 C)  98.7 F (37.1 C)  TempSrc: Oral Oral  Oral  SpO2: 99% 99% 99% 99%  Weight:      Height:        GEN- The patient is well appearing, alert and oriented x 3 today.   Head- normocephalic, atraumatic Eyes-  Sclera clear, conjunctiva pink Ears- hearing intact Oropharynx- clear Neck- supple Lungs- CTA b/l, normal work of breathing Heart- RRR, no murmurs, rubs or gallops  GI- soft, NT, ND Extremities- no clubbing, cyanosis, or edema MS- no significant deformity or atrophy Skin- no rash or lesion Psych- euthymic mood, full affect   Labs:   Lab Results  Component Value Date   WBC 4.3  05/14/2021   HGB 11.9 (L) 05/14/2021   HCT 37.3 05/14/2021   MCV 79.0 (L) 05/14/2021   PLT 263 05/14/2021    Recent Labs  Lab 05/14/21 0439  NA 138  K 3.8  CL 106  CO2 26  BUN 14  CREATININE 0.72  CALCIUM 8.8*  PROT 5.9*  BILITOT 0.5  ALKPHOS 108  ALT 12  AST 15  GLUCOSE 123*   Lab Results  Component Value Date   TROPONINI 0.01 11/18/2013   Lab Results  Component Value Date   CHOL 195 05/14/2021   CHOL 203 (H) 05/11/2021   CHOL 156 06/30/2018   Lab Results  Component Value Date   HDL 72 05/14/2021   HDL 83 05/11/2021   HDL 83 06/30/2018   Lab Results  Component Value Date   LDLCALC 111 (H) 05/14/2021   LDLCALC 103 (H) 05/11/2021   LDLCALC 59 06/30/2018   Lab Results  Component Value Date   TRIG 62 05/14/2021   TRIG 95 05/11/2021   TRIG 71 06/30/2018   Lab Results  Component Value Date   CHOLHDL 2.7 05/14/2021   CHOLHDL 2.4 05/11/2021   CHOLHDL 1.9 06/30/2018   Lab Results  Component Value Date   LDLDIRECT 120.9 05/01/2013   LDLDIRECT 126.4 06/21/2011   LDLDIRECT 129.5 12/07/2010    No results found for: DDIMER   Radiology/Studies:  CT ANGIO HEAD NECK W WO CM Result Date: 05/13/2021 CLINICAL DATA:  Recent stroke, worsening altered mental status, concern for new stroke EXAM: CT ANGIOGRAPHY HEAD AND NECK TECHNIQUE: Multidetector CT imaging of the head and neck was performed using the [name] protocol during bolus administration of intravenous contrast. Multiplanar CT image reconstructions and MIPs were obtained to evaluate the vascular anatomy. Carotid stenosis measurements (when applicable) are obtained utilizing NASCET criteria, using the distal internal carotid diameter as the denominator. CONTRAST:  42m OMNIPAQUE IOHEXOL 350 MG/ML  SOLN COMPARISON:  Brain MRI 05/10/2021 FINDINGS: CT HEAD FINDINGS Brain: There is hypodensity in the right frontal lobe white matter abutting the superior margin of the right lateral ventricle consistent with evolving  subacute infarct as seen on prior MRI. There is no other evidence of acute infarct. There is no acute intracranial hemorrhage or extra-axial fluid collection. There is mild parenchymal volume loss with prominence of the extra-axial CSF spaces overlying the bilateral cerebral hemispheres. The ventricles are normal in size. There is no mass lesion.  There is no midline shift. Vascular: There is mild calcification of the intracranial ICAs. No dense vessel is seen. Skull: Normal. Negative for fracture or focal lesion. Sinuses: Postsurgical changes are seen in the sinuses. Orbits: Bilateral lens implants are in place. The globes and orbits are otherwise unremarkable. Review of the MIP images confirms the above findings CTA NECK FINDINGS Aortic arch: There is mild calcified atherosclerotic plaque in the aortic arch. The origins of the major branch vessels are patent. Right carotid system: The right common carotid artery is patent. There is calcified atherosclerotic plaque in the proximal right internal carotid artery resulting in up to approximately 40-50% stenosis. The distal right internal carotid artery is patent. The right external carotid artery is patent. There is no evidence of dissection or aneurysm. Left carotid system: The left common carotid artery is patent. There is mild calcified atherosclerotic plaque in the proximal left internal carotid artery without hemodynamically significant stenosis or occlusion. The distal left internal carotid artery is patent. The left external carotid artery is patent. There is no dissection or aneurysm. Vertebral arteries: The vertebral arteries are patent, without hemodynamically significant stenosis, occlusion, dissection, or aneurysm. Skeleton: There is mild multilevel degenerative change in the cervical spine. There is no visible canal hematoma. There is no acute osseous abnormality or aggressive osseous lesion. Other neck: The thyroid is heterogeneous with partially  calcified nodules, the largest measuring up to 1.2 cm. The soft tissues are otherwise unremarkable. Upper chest: The imaged lung apices are clear. Review of the MIP images confirms the above findings CTA HEAD FINDINGS Anterior circulation: There is calcified atherosclerotic plaque in the bilateral intracranial ICAs resulting in up to moderate stenosis of the right ophthalmic segment and moderate stenosis of the left supraclinoid segment. There is a 2 mm inferiorly directed outpouching arising from the left supraclinoid segment (15-152). No definite vessel is seen emanating from the tip of this outpouching. The bilateral A1 segments are patent. The anterior communicating artery is patent. The right A2 segment is occluded proximally without appreciable reconstitution of flow. The left ACA is patent throughout. There is an approximately 4 mm segment of moderate to severe stenosis of the left M1 segment. The distal left MCA branches are patent. The right M1 segment is patent. There is severe stenosis of an inferior M2 branch shortly after the bifurcation. The distal right MCA branches are patent. Posterior circulation: The V4 segments are patent. The basilar artery is patent. There is severe stenosis of the left P1 segment with occlusion at the level of the P1/P2 junction (12-144). There is diminutive reconstitution of flow in the distal left PCA (12-117). There is severe focal stenosis at the right P1/P2 junction just inferior to an adjacent traversing vein (12-142). There is reconstitution of flow after the stenosis with more mild multifocal irregularity of the distal branches. Venous sinuses: As permitted by contrast timing, patent. Anatomic variants: None. Review of the MIP images confirms the above findings IMPRESSION: 1. Evolving subacute infarct  in the right frontal lobe periventricular white matter in the ACA distribution as seen on prior MRI. No evidence of new infarct or other acute intracranial pathology. 2.  Occlusion of the right P2 segment shortly after the anterior communicating artery without appreciable reconstitution of flow. 3. Occlusion of the left PCA at the P1/P2 junction with diminutive reconstitution of flow distally. 4. Other intracranial atherosclerotic disease as above resulting in severe focal stenosis of the right PCA at the P1/P2 junction with reconstitution of flow distally, severe stenosis of an inferior right M2 branch, and moderate stenosis of the intracranial ICAs bilaterally. 5. 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 6. 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 These results were called by telephone at the time of interpretation on 05/13/2021 at 1:39 pm to provider [facility] , who verbally acknowledged these results. Electronically Signed   By: Valetta Mole M.D.   On: 05/13/2021 13:41   MR BRAIN W WO CONTRAST Result Date: 05/10/2021  [facility] [address] [place], [place] [address] [phone] NEUROIMAGING REPORT STUDY DATE: 05/10/2021 PATIENT NAME: SHIAN GOODNOW DOB: March 13, 1941 MRN: [id number] ORDERING CLINICIAN: : Dr Billey Gosling CLINICAL HISTORY: 78 year patient with memory loss COMPARISON FILMS: none EXAM: MRI Brain w/wo TECHNIQUE: Sagittal T1, axial DWI, ADC map, GRE, T1, FLAIR T2, coronal T2 and postcontrast axial and coronal T1 images were obtained. CONTRAST: IV MultiHance IMAGING SITE: [facility] FINDINGS: The brain parenchyma shows area of diffusion positivity with slight impaired ADC signal in the right parasagittal frontal region affecting corpus callosum which likely late acute to early subacute infarct.  There are mild changes of chronic small vessel disease and there are prominent changes of generalized cerebral atrophy which is slightly age disproportionate.  No other structural  lesion or tumor is noted.  Extra-axial brain structures appear normal.  Calvarium shows no abnormalities.  Orbits appear unremarkable paranasal sinuses show mild chronic inflammatory changes with small mucous retention cyst/polyp in the right maxillary antrum.  The pituitary gland and cerebellar tonsils appear normal.  Visualized portion of the cervical spine show mild degenerative changes.  Flow-voids of large vessel intracranial circulation appear to be patent in the right vertebral artery appears to be hypoplastic.Post postcontrast images do not result in abnormal areas of enhancement.  Abnormal MRI scan of the brain showing late acute right frontal parasagittal infarct.  There are moderate changes of chronic small vessel disease as well as generalized cerebral atrophy.  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  Post postcontrast images do not result in abnormal areas of enhancement. INTERPRETING PHYSICIAN: Antony Contras, MD Certified in  Neuroimaging by [place] [employer] and [employer]   12-lead ECG SR, qst degree Avblock All prior EKG's in EPIC reviewed with no documented atrial fibrillation  Telemetry SR/ST   Assessment and Plan:  1. Cryptogenic stroke The patient presents with cryptogenic stroke.     I spoke at length with the patient and her daughter about monitoring for afib with either a 30 day event monitor or an implantable loop recorder.  Risks, benefits, and alteratives to implantable loop recorder were discussed with the patient today.   At this time, the daughter and the patient prefer to start more conservatively with an event monitor.  This AM she has worsened generalized weakness and LLE weakness as well that  PT found new for her, neurology called to bedside and is evaluating her  We Tina [name] follow from afar and plan for/order event monitoring and outpatient follow up as her clinical course progresses and ready  for discharge  Dr. Curt Bears Haillee Johann see later today   Baldwin Jamaica, PA-C 05/15/2021  I have seen and examined this patient with Tina [name].  Agree with above, note added to reflect my findings.  Patient admitted to hospital with cryptogenic stroke.  Unfortunately on evaluation this morning, she was found to have worsening.  GEN: Well nourished, well developed, in no acute distress  HEENT: normal  Neck: no JVD, carotid bruits, or masses Cardiac: RRR; no murmurs, rubs, or gallops,no edema  Respiratory:  clear to auscultation bilaterally, normal work of breathing GI: soft, nontender, nondistended, + BS MS: no deformity or atrophy  Skin: warm and dry Neuro:  Strength and sensation are intact Psych: euthymic mood, full affect   Cryptogenic stroke: Unfortunately has had worsening symptoms.  Has plans for CT scan later today.  She would benefit from Linq monitor implant once her overall disposition has been determined.  We Sidi Dzikowski continue to follow along in the chart for timing.  Tina Nazario M. Manfred Laspina MD 05/15/2021 2:22 PM

## 2021-05-15 NOTE — Progress Notes (Signed)
STROKE TEAM PROGRESS NOTE   INTERVAL HISTORY Her daughter is at the bedside.  Patient seems to not been doing so well today.  She had trouble transferring from the chair to the bed and appears weaker in the left leg.  She has had early been cleared for discharge home by therapist but now seems to be revising the opinion.  Vitals:   05/15/21 0406 05/15/21 0509 05/15/21 0941 05/15/21 1149  BP: (!) 182/75 138/77 129/66 129/66  Pulse: 79 (!) 103 91 88  Resp: 15  20 20   Temp: 97.7 F (36.5 C)  98.7 F (37.1 C) 98.5 F (36.9 C)  TempSrc: Oral  Oral Oral  SpO2: 99% 99% 99% 99%  Weight:      Height:       CBC:  Recent Labs  Lab 05/13/21 1136 05/14/21 0439  WBC 4.4 4.3  NEUTROABS 2.7 2.7  HGB 12.2 11.9*  HCT 39.0 37.3  MCV 80.2 79.0*  PLT 278 263   Basic Metabolic Panel:  Recent Labs  Lab 05/13/21 1136 05/14/21 0439  NA 138 138  K 4.3 3.8  CL 103 106  CO2 31 26  GLUCOSE 139* 123*  BUN 20 14  CREATININE 0.97 0.72  CALCIUM 8.9 8.8*  MG 1.9 1.9   Lipid Panel:  Recent Labs  Lab 05/14/21 0439  CHOL 195  TRIG 62  HDL 72  CHOLHDL 2.7  VLDL 12  LDLCALC 05/16/21*   HgbA1c:  Recent Labs  Lab 05/14/21 0439  HGBA1C 9.2*   Urine Drug Screen: No results for input(s): LABOPIA, COCAINSCRNUR, LABBENZ, AMPHETMU, THCU, LABBARB in the last 168 hours.  Alcohol Level No results for input(s): ETH in the last 168 hours.  IMAGING past 24 hours No results found.  PHYSICAL EXAM  Temp:  [97.7 F (36.5 C)-98.7 F (37.1 C)] 98.5 F (36.9 C) (12/19 1149) Pulse Rate:  [70-103] 88 (12/19 1149) Resp:  [15-20] 20 (12/19 1149) BP: (129-182)/(66-80) 129/66 (12/19 1149) SpO2:  [98 %-99 %] 99 % (12/19 1149)  General - Well nourished, well developed, pleasant elderly Caucasian lady in no apparent distress. Ophthalmologic - fundi not visualized due to noncooperation. Cardiovascular - Regular rhythm and rate.  Mental Status -  Level of arousal and orientation to time, place, and person  were intact. Language including expression, naming, repetition, comprehension was assessed and found intact. Attention span and concentration were normal. Fund of Knowledge was assessed and was intact. Impaired memory  Cranial Nerves II - XII - II - Visual field intact OU. III, IV, VI - Extraocular movements intact. V - Facial sensation intact bilaterally. VII - Facial movement intact bilaterally. VIII - Hearing & vestibular intact bilaterally. X - Palate elevates symmetrically. XI - Chin turning & shoulder shrug intact bilaterally. XII - Tongue protrusion intact.  Motor Strength -  Bulk was normal and fasciculations were absent.  Slight drift in left lower extremity.  Baseline strength in right and left upper extremities and right lower extremity .diminished foot tapping on the left. Motor Tone - Muscle tone was assessed at the neck and appendages and was normal. Reflexes - The patients reflexes were symmetrical in all extremities and she had no pathological reflexes. Sensory - Light touch, temperature/pinprick were assessed and were symmetrical.   Coordination - The patient had normal movements in the hands and feet with no ataxia or dysmetria.  Tremor was absent. Gait and Station - deferred.   ASSESSMENT/PLAN Tina [name] is a 80 y.o. female with  history of progressive memory loss, type 2 diabetes, HCOM, hypertension, hypothyroidism presenting with increased confusion, lethargy, weakness and a general decline in function over the last couple weeks.  She is followed with [facility] neurology for progressive memory loss for 2 years.  Her Namenda and Aricept have been recently changed which is what her family initially attributed her change to.  She went for an MRI on Wednesday, 12/14 and her daughter noticed that Tina [name] was having progressive difficulty getting in and out of the car, which is typically not an issue for her. She has also started using a walker to get around her house.  She did have a fall two weeks ago.  The MRI showed a right frontal parasagittal infarct.   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 Occlusion of the left PCA at the P1/P2 junction with diminutive reconstitution of flow distally.  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  40 to 50% stenosis on the right carotid bulb.  MRI- late acute right frontal parasagittal infarct 2D Echo EF 70 to 75%, hyperdynamic function with a left ventricle.  Bubble study negative LDL 111 HgbA1c 9.2 VTE prophylaxis - Lovenox 40 mg daily aspirin 81 mg daily prior to admission, now on aspirin 81 mg daily and clopidogrel 75 mg daily.  For 3 weeks followed by Plavix alone  therapy recommendations:  pending Disposition:  pending  Hypertension Home meds: None Normotension Hydralazine 10 mg q6hr prn  Hyperlipidemia Home meds:  zetia 10mg , resumed in hospital LDL 111, goal < 70 Add atorvastatin 40mg   High intensity statin  Continue statin at discharge  Diabetes type II Uncontrolled Home meds:   HgbA1c 9.2, goal < 7.0 CBGs Recent Labs    05/15/21 1013 05/15/21 1041 05/15/21 1329  GLUCAP 153* 147* 88    SSI  Other Stroke Risk Factors Advanced Age >/= 65  Coronary artery disease  Other Active Problems Progressive memory loss Namenda 10 mg twice daily and Aricept milligrams twice daily Follows with [facility] day # 1  Patient appears to have shown slight neurological worsening with increased left leg weakness hence recommend canceling the planned  discharge for today bedrest for today given IV fluids and repeat stat CT scan of the head.  Therapy now recommends rehab.  Continue cardiac monitoring and if A. fib is not found recommend loop recorder prior to discharge.  Aspirin and Plavix for 3 weeks followed by Plavix alone.  Aggressive risk factor modification.  Long discussion with patient and daughter at bedside and answered questions.  Discussed with Dr.Dahal.  Greater than 50% time during this 35-minute visit was spent in counseling and coordination of care about her embolic stroke and discussion of stroke prevention and treatment and answering questions.  Antony Contras, MD Medical Director [facility] Pager: [phone] 05/15/2021 2:07 PM    To contact Stroke Continuity provider, please refer to [email or web address] After hours, contact General Neurology

## 2021-05-15 NOTE — Progress Notes (Addendum)
Occupational Therapy Treatment Patient Details Name: Tina [name] MRN: [id number] DOB: 11-30-1940 Today's Date: 05/15/2021   History of present illness Tina [name] is a 80 y.o. female who presented with increased confusion and lethargy x 1 day along with decline in function over the last couple weeks. 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 PMH significant for DM2, HOCM, HTN, hypothyroidism, memory changes.   OT comments  Upon arrival, pt's daughter expressed concern of pt decline and ability to go home safely. Pt also reported dizziness and headache. Overall she required increased assistance for all tasks assessed compared to therapy sessions yesterday 12/18. She needed min A for bed mobility and had L lateral lean upon sitting which required physical assist to correct. She also needed mod A for simple sit<>stand and short ambulation with the RW. Pt was unable to progress functional ambulation beyond ~11ft due to dizziness, saying, "I am going to fall." Her UE strength was comparable to yesterday, but her LLE seemed weaker and less coordinated. RN and MD notified. Pt's daughter does not think she can handle pt's current assist levels and would prefer pt d/c to SNF for further rehab, due to apparent decline in function SNF is appropriate.    Recommendations for follow up therapy are one component of a multi-disciplinary discharge planning process, led by the attending physician.  Recommendations may be updated based on patient status, additional functional criteria and insurance authorization.    Follow Up Recommendations  Skilled nursing-short term rehab (<3 hours/day)    Assistance Recommended at Discharge Frequent or constant Supervision/Assistance  Equipment Recommendations  Other (comment) (pending pt progress)       Precautions / Restrictions Precautions Precautions: Fall Restrictions Weight Bearing Restrictions: No       Mobility Bed Mobility Overal bed mobility:  Needs Assistance Bed Mobility: Supine to Sit;Sit to Supine     Supine to sit: Min assist Sit to supine: Min assist        Transfers Overall transfer level: Needs assistance Equipment used: Rolling walker (2 wheels) Transfers: Sit to/from Stand Sit to Stand: Mod assist           General transfer comment: incrased assist for power up and steadying in standing. Pt with L lateral lean     Balance Overall balance assessment: Needs assistance;History of Falls Sitting-balance support: Feet supported Sitting balance-Leahy Scale: Poor Sitting balance - Comments: L lateral lean   Standing balance support: Bilateral upper extremity supported;Reliant on assistive device for balance Standing balance-Leahy Scale: Poor               ADL either performed or assessed with clinical judgement   ADL Overall ADL's : Needs assistance/impaired Eating/Feeding: Independent;Bed level Eating/Feeding Details (indicate cue type and reason): per daughter, she has been helping pt eat due to general fatigue Grooming: Moderate assistance;Standing Grooming Details (indicate cue type and reason): mod A for standing balance                 Toilet Transfer: Moderate assistance;Rolling walker (2 wheels);Ambulation Toilet Transfer Details (indicate cue type and reason): mod A to power into standing, verbal cues to bilat feet placement. Assist to bring R hand ot RW. step by step cues         Functional mobility during ADLs: Moderate assistance;Rolling walker (2 wheels) General ADL Comments: pt with aparant decline in function today and required incrsaed assistance for all tasks assessed    Extremity/Trunk Assessment Upper Extremity Assessment Upper Extremity Assessment:  RUE deficits/detail;LUE deficits/detail RUE Deficits / Details: strength is generally 4/5 LUE Deficits / Details: strength is geneally 3+/5   Lower Extremity Assessment Lower Extremity Assessment: Defer to PT evaluation         Vision   Additional Comments: pt wtih complaints of dizziness this session          Cognition Arousal/Alertness: Awake/alert Behavior During Therapy: Flat affect Overall Cognitive Status: History of cognitive impairments - at baseline             General Comments: decreased STM, unable to complete 2 step task. required frequent cues. Complaints of headache and dizziness this session. some anxiety noted as pt kept saying "I am going to fall" Functional Status Assessment: Patient has had a recent decline in their functional status and demonstrates the ability to make significant improvements in function in a reasonable and predictable amount of time.              General Comments VSS on RA, pt's daughter reported concern for going home today as the pt is not doing as well today compared to yesterday. RN, cardiologit and neurologist present and aware    Pertinent Vitals/ Pain       Pain Assessment: No/denies pain  Home Living     Available Help at Discharge: Family;Available 24 hours/day Type of Home: House            Lives With: Spouse        Frequency  Min 2X/week        Progress Toward Goals  OT Goals(current goals can now be found in the care plan section)  Progress towards OT goals: Not progressing toward goals - comment (increased assistance this session)  Acute Rehab OT Goals Patient Stated Goal: go to rehab OT Goal Formulation: With patient Time For Goal Achievement: 05/28/21 Potential to Achieve Goals: Good ADL Goals Pt Will Perform Grooming: with modified independence;standing Pt Will Perform Lower Body Bathing: with modified independence;sit to/from stand Pt Will Perform Lower Body Dressing: with modified independence;sit to/from stand Pt Will Transfer to Toilet: with modified independence;ambulating Additional ADL Goal #1: Pt will indep verbalize at least 3 fall prevention strategies to apply in the home setting  Plan Discharge plan  needs to be updated       AM-PAC OT "6 Clicks" Daily Activity     Outcome Measure   Help from another person eating meals?: None Help from another person taking care of personal grooming?: A Little Help from another person toileting, which includes using toliet, bedpan, or urinal?: A Little Help from another person bathing (including washing, rinsing, drying)?: A Little Help from another person to put on and taking off regular upper body clothing?: A Little Help from another person to put on and taking off regular lower body clothing?: A Little 6 Click Score: 19    End of Session Equipment Utilized During Treatment: Gait belt;Rolling walker (2 wheels)  OT Visit Diagnosis: Unsteadiness on feet (R26.81);Other abnormalities of gait and mobility (R26.89);Repeated falls (R29.6);Muscle weakness (generalized) (M62.81);History of falling (Z91.81);Pain;Hemiplegia and hemiparesis Hemiplegia - Right/Left: Left Hemiplegia - dominant/non-dominant: Non-Dominant Hemiplegia - caused by: Cerebral infarction   Activity Tolerance Patient tolerated treatment well   Patient Left in bed;with call bell/phone within reach;with bed alarm set;with family/visitor present   Nurse Communication Mobility status        Time: 0277-4128 OT Time Calculation (min): 32 min  Charges: OT General Charges $OT Visit: 1 Visit OT Treatments $Self Care/Home Management :  23-37 mins   Indiya Izquierdo A Hiroyuki Ozanich 05/15/2021, 12:48 PM

## 2021-05-15 NOTE — Telephone Encounter (Addendum)
Noted, message was sent to Dr. Delena Bali by Dr. Frances Furbish at the time of the daughters call.

## 2021-05-15 NOTE — Progress Notes (Signed)
PT Cancellation Note  Patient Details Name: Tina [name] MRN: [id number] DOB: 08/05/1940   Cancelled Treatment:    Reason Eval/Treat Not Completed: Medical issues which prohibited therapy Pt with worsening left lower extremity weakness and mobility compared to yesterday. Dr. Pearlean Brownie evaluated, recommended bedrest for now and CT scan. Will re-evaluate when appropriate.   Lillia Pauls, PT, DPT Acute Rehabilitation Services Pager [phone] Office [phone]    [name] 05/15/2021, 2:41 PM

## 2021-05-16 DIAGNOSIS — R4182 Altered mental status, unspecified: Secondary | ICD-10-CM | POA: Diagnosis not present

## 2021-05-16 DIAGNOSIS — G9341 Metabolic encephalopathy: Secondary | ICD-10-CM | POA: Diagnosis not present

## 2021-05-16 LAB — RESP PANEL BY RT-PCR (FLU A&B, COVID) ARPGX2
Influenza A by PCR: NEGATIVE
Influenza B by PCR: NEGATIVE
SARS Coronavirus 2 by RT PCR: NEGATIVE

## 2021-05-16 LAB — GLUCOSE, CAPILLARY
Glucose-Capillary: 92 mg/dL (ref 70–99)
Glucose-Capillary: 141 mg/dL — ABNORMAL HIGH (ref 70–99)
Glucose-Capillary: 128 mg/dL — ABNORMAL HIGH (ref 70–99)
Glucose-Capillary: 156 mg/dL — ABNORMAL HIGH (ref 70–99)

## 2021-05-16 LAB — URINALYSIS, ROUTINE W REFLEX MICROSCOPIC
Bacteria, UA: NONE SEEN
Bilirubin Urine: NEGATIVE
Glucose, UA: 50 mg/dL — AB
Hgb urine dipstick: NEGATIVE
Ketones, ur: NEGATIVE mg/dL
Nitrite: NEGATIVE
Protein, ur: NEGATIVE mg/dL
Specific Gravity, Urine: 1.013 (ref 1.005–1.030)
pH: 6 (ref 5.0–8.0)

## 2021-05-16 MED ORDER — SODIUM CHLORIDE 0.9 % IV SOLN
500.0000 mL | INTRAVENOUS | Status: DC
  Administered 2021-05-17 – 2021-05-18 (×2): 500 mL via INTRAVENOUS
  Administered 2021-05-19: 14:00:00 250 mL via INTRAVENOUS
  Administered 2021-05-19 – 2021-05-21 (×4): 500 mL via INTRAVENOUS

## 2021-05-16 MED ORDER — TAMSULOSIN HCL 0.4 MG PO CAPS
0.4000 mg | ORAL_CAPSULE | Freq: Every day | ORAL | Status: DC
  Administered 2021-05-16 – 2021-05-18 (×3): 0.4 mg via ORAL
  Filled 2021-05-16 (×3): qty 1

## 2021-05-16 MED ORDER — PHENAZOPYRIDINE HCL 100 MG PO TABS
100.0000 mg | ORAL_TABLET | Freq: Three times a day (TID) | ORAL | Status: AC
  Administered 2021-05-16 – 2021-05-19 (×9): 100 mg via ORAL
  Filled 2021-05-16 (×9): qty 1

## 2021-05-16 MED ORDER — SODIUM CHLORIDE 0.9 % IV SOLN: 500.0000 mL | INTRAVENOUS | Status: DC

## 2021-05-16 NOTE — Progress Notes (Signed)
Pt HR is 117, note to as high as 127. Pt is restless. On call provider paged to inform.

## 2021-05-16 NOTE — Progress Notes (Signed)
STROKE TEAM PROGRESS NOTE   INTERVAL HISTORY Her daughter is at the bedside.  Patient is sitting up in bed.  She continues to have left leg weakness.  Vital signs stable.  She is now looking and going to rehabilitation in SNF. Repeat CT scan head yesterday had shown no acute change Vitals:   05/15/21 2329 05/16/21 0535 05/16/21 0935 05/16/21 1148  BP: (!) 151/71 (!) 144/59 (!) 141/73 (!) 153/60  Pulse: 79 75 97 (!) 103  Resp: 16 16 17 19   Temp: 97.7 F (36.5 C) 98.3 F (36.8 C) 97.7 F (36.5 C) 97.8 F (36.6 C)  TempSrc: Oral Oral Oral Oral  SpO2: 99% 100% 100% 100%  Weight:      Height:       CBC:  Recent Labs  Lab 05/13/21 1136 05/14/21 0439  WBC 4.4 4.3  NEUTROABS 2.7 2.7  HGB 12.2 11.9*  HCT 39.0 37.3  MCV 80.2 79.0*  PLT 278 [id number]   Basic Metabolic Panel:  Recent Labs  Lab 05/13/21 1136 05/14/21 0439  NA 138 138  K 4.3 3.8  CL 103 106  CO2 31 26  GLUCOSE 139* 123*  BUN 20 14  CREATININE 0.97 0.72  CALCIUM 8.9 8.8*  MG 1.9 1.9   Lipid Panel:  Recent Labs  Lab 05/14/21 0439  CHOL 195  TRIG 62  HDL 72  CHOLHDL 2.7  VLDL 12  LDLCALC 111*   HgbA1c:  Recent Labs  Lab 05/14/21 0439  HGBA1C 9.2*   Urine Drug Screen: No results for input(s): LABOPIA, COCAINSCRNUR, LABBENZ, AMPHETMU, THCU, LABBARB in the last 168 hours.  Alcohol Level No results for input(s): ETH in the last 168 hours.  IMAGING past 24 hours CT HEAD WO CONTRAST (5MM)  Result Date: 05/15/2021 CLINICAL DATA:  Stroke, follow-up.  Altered mental status. EXAM: CT HEAD WITHOUT CONTRAST TECHNIQUE: Contiguous axial images were obtained from the base of the skull through the vertex without intravenous contrast. COMPARISON:  CT angiogram head/neck 05/13/2021. Brain MRI 05/10/2021. FINDINGS: Brain: Mild generalized cerebral atrophy. Redemonstrated subacute infarct within the medial right frontal lobe white matter and callosal body (ACA vascular territory). No significant mass effect. No evidence  of hemorrhagic conversion. Background mild patchy and ill-defined hypoattenuation within the cerebral white matter, nonspecific but compatible with chronic small vessel ischemic disease. Redemonstrated chronic lacunar infarct within the left caudate head. No interval acute infarct is identified. No extra-axial fluid collection. No evidence of an intracranial mass. No midline shift. Vascular: No hyperdense vessel.  Atherosclerotic calcifications. Skull: Normal. Negative for fracture or focal lesion. Sinuses/Orbits: Visualized orbits show no acute finding. Scattered mild mucosal thickening and small volume fluid within the bilateral frontal, ethmoid and sphenoid sinuses. Mild mucosal thickening within the visualized bilateral maxillary sinuses. Superimposed small right maxillary sinus mucous retention cyst. IMPRESSION: Stable size of a known subacute white matter infarct within the medial right frontal lobe and callosal body (ACA vascular territory). No significant mass effect or evidence of hemorrhagic conversion. No CT evidence of an interval acute infarct. Redemonstrated chronic lacunar infarct within the left caudate head. Background mild chronic small vessel ischemic changes within the cerebral white matter. Mild generalized cerebral atrophy. Paranasal sinus disease at the imaged levels, as described Electronically Signed   By: Kellie Simmering D.O.   On: 05/15/2021 17:21    PHYSICAL EXAM  Temp:  [97.6 F (36.4 C)-98.3 F (36.8 C)] 97.8 F (36.6 C) (12/20 1148) Pulse Rate:  [75-103] 103 (12/20 1148) Resp:  [16-20]  19 (12/20 1148) BP: (124-162)/(49-73) 153/60 (12/20 1148) SpO2:  [98 %-100 %] 100 % (12/20 1148)  General - Well nourished, well developed, pleasant elderly Caucasian lady in no apparent distress. Ophthalmologic - fundi not visualized due to noncooperation. Cardiovascular - Regular rhythm and rate.  Mental Status -  Level of arousal and orientation to time, place, and person were  intact. Language including expression, naming, repetition, comprehension was assessed and found intact. Attention span and concentration were normal. Fund of Knowledge was assessed and was intact. Impaired memory  Cranial Nerves II - XII - II - Visual field intact OU. III, IV, VI - Extraocular movements intact. V - Facial sensation intact bilaterally. VII - Facial movement intact bilaterally. VIII - Hearing & vestibular intact bilaterally. X - Palate elevates symmetrically. XI - Chin turning & shoulder shrug intact bilaterally. XII - Tongue protrusion intact.  Motor Strength -  Bulk was normal and fasciculations were absent.  Slight drift in left lower extremity.  Baseline strength in right and left upper extremities and right lower extremity .diminished foot tapping on the left. Motor Tone - Muscle tone was assessed at the neck and appendages and was normal. Reflexes - The patients reflexes were symmetrical in all extremities and she had no pathological reflexes. Sensory - Light touch, temperature/pinprick were assessed and were symmetrical.   Coordination - The patient had normal movements in the hands and feet with no ataxia or dysmetria.  Tremor was absent. Gait and Station - deferred.   ASSESSMENT/PLAN [name] is a 80 y.o. female with history of progressive memory loss, type 2 diabetes, HCOM, hypertension, hypothyroidism presenting with increased confusion, lethargy, weakness and a general decline in function over the last couple weeks.  She is followed with [facility] neurology for progressive memory loss for 2 years.  Her Namenda and Aricept have been recently changed which is what her family initially attributed her change to.  She went for an MRI on Wednesday, 12/14 and her daughter noticed that Tina [name] was having progressive difficulty getting in and out of the car, which is typically not an issue for her. She has also started using a walker to get around her house. She  did have a fall two weeks ago.  The MRI showed a right frontal parasagittal infarct.   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 Occlusion of the left PCA at the P1/P2 junction with diminutive reconstitution of flow distally.  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  40 to 50% stenosis on the right carotid bulb.  MRI- late acute right frontal parasagittal infarct 2D Echo EF 70 to 75%, hyperdynamic function with a left ventricle.  Bubble study negative LDL 111 HgbA1c 9.2 VTE prophylaxis - Lovenox 40 mg daily aspirin 81 mg daily prior to admission, now on aspirin 81 mg daily and clopidogrel 75 mg daily.  For 3 weeks followed by Plavix alone  therapy recommendations:  SNF Disposition:  SNF  Hypertension Home meds: None Normotension Hydralazine 10 mg q6hr prn  Hyperlipidemia Home meds:  zetia 10mg , resumed in hospital LDL 111, goal < 70  Add atorvastatin 40mg   High intensity statin  Continue statin at discharge  Diabetes type II Uncontrolled Home meds:   HgbA1c 9.2, goal < 7.0 CBGs Recent Labs    05/15/21 2154 05/16/21 0538 05/16/21 1013  GLUCAP 236* 92 141*    SSI  Other Stroke Risk Factors Advanced Age >/= 65  Coronary artery disease  Other Active Problems Progressive memory loss Namenda 10 mg twice daily and Aricept milligrams twice daily Follows with [facility] day # 2  Patient continues to have left leg weakness hence would benefit to be going to SNF for rehabilitation.  Continue cardiac monitoring and if A. fib  is not found recommend loop recorder prior to discharge.  Aspirin and Plavix for 3 weeks followed by Plavix alone.  Aggressive risk factor modification.  Long discussion with patient and daughter at bedside and answered questions.  Discussed with Dr.Dahal and patient's daughter.  Stroke team will sign off.  Kindly call for questions greater than 50% time during this 25-minute visit was spent in counseling and coordination of care about her embolic stroke and discussion of stroke prevention and treatment and answering questions.  [facility], MD Medical Director [facility] Pager: [phone] 05/16/2021 3:12 PM    To contact Stroke Continuity provider, please refer to 05/18/2021. After hours, contact General Neurology

## 2021-05-16 NOTE — Progress Notes (Signed)
PROGRESS NOTE  Tina [name]  DOB: [birth year]  PCP: Leonard Downing, MD [id number]  DOA: 05/13/2021  LOS: 2 days  Hospital Day: 4  Chief Complaint  Patient presents with   Altered Mental Status   Brief narrative: Tina [name] is a 80 y.o. female with PMH significant for DM2, HOCM, HTN, hypothyroidism, dementia Patient presented at [facility] ED on 12/17 for increased confusion and lethargy for a day along with decline in function over the last couple of weeks.   For the last 2 years, patient has been following up with [facility] neurology for progressive memory loss.  As part of the work-up, she had an MRI brain done on 12/14 which showed a late acute R frontal parasagittal infarct for which she was started on DAPT with workup planned outpatient.   In the ED, patient was afebrile, hemodynamically stable CT angio of head and neck showed 1. Evolving subacute infarct in the right frontal lobe periventricular white matter in the ACA distribution as seen on prior MRI from 12/14. No evidence of new infarct or other acute intracranial pathology. 2. Multifocal bilateral intracranial small vessel occlusions, stenosis  3. 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  Patient admitted to hospitalist service. Neurology consultation was obtained.  Subjective: Patient was seen and examined this morning.  Sitting up in chair.  Feels weak.  Pending repeat PT eval She was planned for discharged home yesterday but she started having worsening weakness of her left extremities.  Neurology order for repeat CT scan of head which did not show any new abnormality. Reevaluated by physical therapy.  SNF recommended.  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 -Repeat CT scan of abdomen/19 for recurrent symptoms did not show any new finding. -Stroke work-up was initiated. -A1c 9.2, HDL 72, LDL 111 -Neurology consult appreciated.  PT/OT eval pending. -Echo with EF 70 to [id number], grade 1 diastolic dysfunction, bubble study negative. -On 12/14, she was started on aspirin and Plavix which should be continued along with statin. -PT evaluation was obtained.  SNF recommended  Acute metabolic encephalopathy Progressively worsening dementia. -Per history, patient has been showing progressive mental decline over the past 1 to 2 years.  Follows up with [facility] as an outpatient.   -Recently initiated on [place] in November.   -Patient reportedly has poor sleep quality, unable to fall or stay asleep. Gets up several times in the middle of night, remains exhausted all day.  She takes care of 2 old dogs and taking them out multiple times in the middle of night. Daughter also reports poor eating habits and patient not taking medications as she should be. Daughter took over meds few weeks ago. Daughter also reports hoarding behavior over the last few months and that patient sleeps on the couch. Daughter also reports that patient rarely drinks water.    Frequent falls -Likely due to progressive physical decline with ongoing dementia.   -PT eval appreciated  Urinary frequency -Patient is infrequently nursing to go to the bathroom.  Denies any dysuria, has no fever.  Bladder scan showed only retention of about 250 mL. -Start on Flomax including.  Also ordered for urinalysis.  Uncontrolled type 2 diabetes mellitus  Hypoglycemia -A1c 9.2 on 05/14/2021.  -Home meds include glipizide and Actos  -Currently on sliding scale insulin with Accu-Cheks only.  Continue monitor. Recent Labs  Lab 05/15/21 1329 05/15/21 1610 05/15/21 2154  05/16/21 0538 05/16/21 1013  GLUCAP 88 133* 236* 92 141*   Hyperlipidemia -Apparently noncompliant to Zetia at home.    Essential hypertension -Losartan    Hypothyroidism Resume home regimen of Synthroid    Mobility: PT eval appreciated Living condition: Was living at home with husband Goals of care:   Code Status: Full Code  Nutritional status: Body mass index is 24.6 kg/m.      Diet:  Diet Order             Diet Carb Modified           Diet - low sodium heart healthy           Diet heart healthy/carb modified Room service appropriate? Yes; Fluid consistency: Thin  Diet effective now                  DVT prophylaxis:  enoxaparin (LOVENOX) injection 40 mg Start: 05/13/21 2145   Antimicrobials: None Fluid: None Consultants: Neurology Family Communication: Discussed with daughter at bedside  Status is: Observation  Continue in-hospital care because: Pending SNF Level of care: Telemetry Medical   Dispo: The patient is from: Home              Anticipated d/c is to: Pending SNF              Patient currently is not medically stable to d/c.   Difficult to place patient No     Infusions:   sodium chloride 500 mL (05/15/21 1459)    Scheduled Meds:  aspirin EC  81 mg Oral q morning   clopidogrel  75 mg Oral Daily   enoxaparin (LOVENOX) injection  40 mg Subcutaneous Q24H   insulin aspart  0-6 Units Subcutaneous TID AC & HS   levothyroxine  100 mcg Oral Q0600   losartan  100 mg Oral QHS   melatonin  3 mg Oral QHS   multivitamin with minerals  1 tablet Oral Daily   phenazopyridine  100 mg Oral TID WC   rosuvastatin  5 mg Oral Daily   tamsulosin  0.4 mg Oral Daily    PRN meds: acetaminophen **OR** acetaminophen, hydrALAZINE, ondansetron **OR** ondansetron (ZOFRAN) IV, polyethylene glycol   Antimicrobials: Anti-infectives (From admission, onward)    None       Objective: Vitals:   05/16/21 0935 05/16/21 1148  BP: (!) 141/73 (!) 153/60   Pulse: 97 (!) 103  Resp: 17 19  Temp: 97.7 F (36.5 C) 97.8 F (36.6 C)  SpO2: 100% 100%    Intake/Output Summary (Last 24 hours) at 05/16/2021 1341 Last data filed at 05/16/2021 1215 Gross per 24 hour  Intake 252.97 ml  Output --  Net 252.97 ml   Filed Weights   05/13/21 1941  Weight: 61 kg   Weight change:  Body mass index is 24.6 kg/m.   Physical Exam: General exam: Pleasant elderly Caucasian female.  Sitting up in chair.  Not in distress.  Daughter at bedside  skin: No rashes, lesions or ulcers. HEENT: Atraumatic, normocephalic, no obvious bleeding Lungs: Clear to auscultation bilaterally CVS: Regular rate and rhythm, no murmur GI/Abd soft, nontender, nondistended, bowel sound present CNS: Alert, awake, oriented to place, person and with difficulty to time Psychiatry: Mood appropriate Extremities: No pedal edema, no calf  tenderness  Data Review: I have personally reviewed the laboratory data and studies available.  F/u labs  Unresulted Labs (From admission, onward)     Start     Ordered   05/16/21 0816  Urinalysis, Routine w reflex microscopic  Once,   R        05/16/21 0815   05/13/21 2210  Urine Culture  Add-on,   AD       Question:  Indication  Answer:  Altered mental status (if no other cause identified)   05/13/21 2209            Signed, Terrilee Croak, MD [facility] 05/16/2021

## 2021-05-16 NOTE — TOC Progression Note (Signed)
Transition of Care [facility]) - Progression Note    Patient Details  Name: Tina [name] MRN: [id number] Date of Birth: [birth year]  [facility] CM/SW Contact  Pollie Friar, RN Phone Number: 05/16/2021, 1:59 PM  Clinical Narrative:    Patient has had a decrease in her activity and PT/OT are now recommending SNF rehab. CM met with the patient and her daughter and they prefer [facility]. CM has sent the referral out with daughters permission. [facility] has offered a bed. Daughter accepted. Pt will dc to [facility] tomorrow. MD updated.    Expected Discharge Plan: [place] Barriers to Discharge: Continued Medical Work up  Expected Discharge Plan and Services Expected Discharge Plan: [place]   Discharge Planning Services: CM Consult Post Acute Care Choice: [place] arrangements for the past 2 months: Single Family Home Expected Discharge Date: 05/15/21                                     Social Determinants of Health (SDOH) Interventions    Readmission Risk Interventions No flowsheet data found.

## 2021-05-16 NOTE — Progress Notes (Signed)
05/16/21 1853  What Happened  Was fall witnessed? Yes  Who witnessed fall? Elaina Hoops, NT  Patients activity before fall bathroom-assisted  Point of contact buttocks  Was patient injured? No  Follow Up  MD notified yes  Time MD notified 76  Family notified No - patient refusal  Progress note created (see row info) Yes  Adult Fall Risk Assessment  Risk Factor Category (scoring not indicated) High fall risk per protocol (document High fall risk)  Age 80  Fall History: Fall within 6 months prior to admission 5  Elimination; Bowel and/or Urine Incontinence 0  Elimination; Bowel and/or Urine Urgency/Frequency 2  Medications: includes PCA/Opiates, Anti-convulsants, Anti-hypertensives, Diuretics, Hypnotics, Laxatives, Sedatives, and Psychotropics 3  Patient Care Equipment 1  Mobility-Assistance 2  Mobility-Gait 2  Mobility-Sensory Deficit 0  Altered awareness of immediate physical environment 0  Impulsiveness 0  Lack of understanding of one's physical/cognitive limitations 0  Total Score 18  Patient Fall Risk Level High fall risk  Adult Fall Risk Interventions  Required Bundle Interventions *See Row Information* High fall risk - low, moderate, and high requirements implemented  Additional Interventions PT/OT need assessed if change in mobility from baseline  Screening for Fall Injury Risk (To be completed on HIGH fall risk patients) - Assessing Need for Floor Mats  Risk For Fall Injury- Criteria for Floor Mats Admitted as a result of a fall  Will Implement Floor Mats Yes  Vitals  Temp 97.8 F (36.6 C)  Temp Source Oral  BP 130/60  BP Location Left Arm  BP Method Automatic  Patient Position (if appropriate) Lying  Pulse Rate 96  Resp 18  Oxygen Therapy  SpO2 99 %  Pain Assessment  Pain Score 0  Neurological  Neuro (WDL) X  Level of Consciousness Alert  Orientation Level Oriented X4  Cognition Appropriate at baseline  Speech Clear  Pupil Assessment  Yes  R Pupil Size  (mm) 2  R Pupil Shape Round  R Pupil Reaction Brisk  L Pupil Size (mm) 2  L Pupil Shape Round  L Pupil Reaction Brisk  Additional Pupil Assessments No  Motor Function/Sensation Assessment Grip;Dorsiflexion;Plantar flexion;Motor response;Sensation  Facial Symmetry Symmetrical  R Hand Grip Present  L Hand Grip Present  R Foot Dorsiflexion Present  L Foot Dorsiflexion Present  R Foot Plantar Flexion Present  L Foot Plantar Flexion Present  RUE Motor Response Purposeful movement  RUE Sensation Full sensation  RUE Motor Strength 4  LUE Motor Response Purposeful movement  LUE Sensation Full sensation  LUE Motor Strength 4  RLE Motor Response Purposeful movement  RLE Sensation Full sensation  RLE Motor Strength 4  LLE Motor Response Purposeful movement  LLE Sensation Full sensation  LLE Motor Strength 4  Glasgow Coma Scale  Eye Opening 4  Best Verbal Response (NON-intubated) 5  Best Motor Response 6  Glasgow Coma Scale Score 15  NIH Stroke Scale ( + Modified Stroke Scale Criteria)   LOC Questions (1b. )   + 0  LOC Commands (1c. )   +  0  Best Gaze (2. )  + 0  Visual (3. )  + 0  Motor Arm, Left (5a. )   + 0  Motor Arm, Right (5b. )   + 0  Motor Leg, Left (6a. )   + 0  Motor Leg, Right (6b. )   + 0  Sensory (8. )   + 0  Best Language (9. )   + 0  Extinction/Inattention (  11.)   + 0  Modified SS Total  + 0  Musculoskeletal  Musculoskeletal (WDL) X  Assistive Device BSC  Generalized Weakness Yes  Weight Bearing Restrictions No  Integumentary  Integumentary (WDL) X  Skin Color Appropriate for ethnicity  Skin Condition Dry  Skin Integrity Ecchymosis  Ecchymosis Location Arm;Hand  Ecchymosis Location Orientation Bilateral

## 2021-05-16 NOTE — Plan of Care (Signed)
Pt is alert oriented x 3. Forgetful Pt has had feeling of wanting to pee but isn't always able to get it out. Pt has voided x 3. No distress noted.   Problem: Education: Goal: Knowledge of General Education information will improve Description: Including pain rating scale, medication(s)/side effects and non-pharmacologic comfort measures 05/16/2021 0805 by Vergie Living, RN Outcome: Progressing 05/15/2021 2002 by Vergie Living, RN Outcome: Progressing   Problem: Health Behavior/Discharge Planning: Goal: Ability to manage health-related needs will improve 05/16/2021 0805 by Vergie Living, RN Outcome: Progressing 05/15/2021 2002 by Vergie Living, RN Outcome: Progressing   Problem: Clinical Measurements: Goal: Diagnostic test results will improve 05/16/2021 0805 by Vergie Living, RN Outcome: Progressing 05/15/2021 2002 by Vergie Living, RN Outcome: Progressing   Problem: Activity: Goal: Risk for activity intolerance will decrease 05/16/2021 0805 by Vergie Living, RN Outcome: Progressing 05/15/2021 2002 by Vergie Living, RN Outcome: Progressing   Problem: Nutrition: Goal: Adequate nutrition will be maintained 05/16/2021 0805 by Vergie Living, RN Outcome: Progressing 05/15/2021 2002 by Vergie Living, RN Outcome: Progressing   Problem: Elimination: Goal: Will not experience complications related to bowel motility 05/16/2021 0805 by Vergie Living, RN Outcome: Progressing 05/15/2021 2002 by Vergie Living, RN Outcome: Progressing Goal: Will not experience complications related to urinary retention 05/16/2021 0805 by Vergie Living, RN Outcome: Progressing 05/15/2021 2002 by Vergie Living, RN Outcome: Progressing   Problem: Safety: Goal: Ability to remain free from injury will improve 05/16/2021 0805 by Vergie Living, RN Outcome: Progressing 05/15/2021 2002 by Vergie Living, RN Outcome:  Progressing   Problem: Skin Integrity: Goal: Risk for impaired skin integrity will decrease 05/16/2021 0805 by Vergie Living, RN Outcome: Progressing 05/15/2021 2002 by Vergie Living, RN Outcome: Progressing

## 2021-05-16 NOTE — NC FL2 (Signed)
[place] LEVEL OF CARE SCREENING TOOL     IDENTIFICATION  Patient Name: Tina [name] Birthdate: December 19, 1940 Sex: female Admission Date (Current Location): 05/13/2021  [facility] [place] Number:  Herbalist and Address:  [facility] [place], [place], [place] [address]      Provider Number: [id number]  Attending Physician Name and Address:  Terrilee Croak, MD  Relative Name and Phone Number:       Current Level of Care: Hospital Recommended Level of Care: [place] Prior Approval Number:    Date Approved/Denied:   PASRR Number: [id number]  Discharge Plan: SNF    Current Diagnoses: Patient Active Problem List   Diagnosis Date Noted   Stroke (cerebrum) ([place]) 05/14/2021   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 ([place]) 05/13/2021   Mixed diabetic hyperlipidemia associated with type 2 diabetes mellitus ([place]) 05/13/2021   Essential hypertension 0000000   Acute metabolic encephalopathy 0000000   Frequent falls 05/13/2021   Dementia without behavioral disturbance ([place]) 05/13/2021   Mixed hyperlipidemia 04/03/2018   Right carotid bruit 12/16/2013   Hypertensive urgency 07/01/2012   Chest pain 07/01/2012   Dyslipidemia 01/10/2012   Benign hypertensive heart disease without heart failure 12/11/2010   Hypothyroidism 12/11/2010   Aortic valve disease 12/11/2010   Sinusitis acute 12/11/2010   Type 2 diabetes mellitus with hypoglycemia without coma, without long-term current use of insulin ([place]) 04/17/2010   HEMORRHOIDS-EXTERNAL 04/17/2010    Orientation RESPIRATION BLADDER Height & Weight     Self, Place  Normal Continent Weight: 61 kg Height:  5\' 2"  (157.5 cm)  BEHAVIORAL SYMPTOMS/MOOD NEUROLOGICAL BOWEL NUTRITION STATUS      Continent Diet (see DC summary)  AMBULATORY STATUS COMMUNICATION OF NEEDS Skin   Limited Assist Verbally Normal                       Personal  Care Assistance Level of Assistance  Bathing, Feeding, Dressing Bathing Assistance: Limited assistance Feeding assistance: Limited assistance Dressing Assistance: Limited assistance     Functional Limitations [place]  PT (By licensed PT), OT (By licensed OT), Speech therapy     PT Frequency: 5x/wk OT Frequency: 5x/wk     Speech Therapy Frequency: 5x/wk      Contractures Contractures Info: Not present    Additional Factors Info  Code Status, Allergies, Insulin Sliding Scale Code Status Info: Full Allergies Info: Carvedilol, Lipitor (Atorvastatin Calcium), Loratadine, Metformin And Related, Pravachol, Zocor (Simvastatin), Metoprolol   Insulin Sliding Scale Info: see DC summary       Current Medications (05/16/2021):  This is the current hospital active medication list Current Facility-Administered Medications  Medication Dose Route Frequency Provider Last Rate Last Admin   0.9 %  sodium chloride infusion  500 mL Intravenous Continuous Garvin Fila, MD 50 mL/hr at 05/15/21 1459 500 mL at 05/15/21 1459   acetaminophen (TYLENOL) tablet 650 mg  650 mg Oral Q6H PRN Vernelle Emerald, MD   650 mg at 05/16/21 1019   Or   acetaminophen (TYLENOL) suppository 650 mg  650 mg Rectal Q6H PRN Shalhoub, Sherryll Burger, MD       aspirin EC tablet 81 mg  81 mg Oral q morning Vernelle Emerald, MD   81 mg at 05/16/21 1019   clopidogrel (PLAVIX) tablet 75 mg  75 mg  Oral Daily Shalhoub, Deno Lunger, MD   75 mg at 05/16/21 1019   enoxaparin (LOVENOX) injection 40 mg  40 mg Subcutaneous Q24H Marinda Elk, MD   40 mg at 05/15/21 2319   hydrALAZINE (APRESOLINE) injection 10 mg  10 mg Intravenous Q6H PRN Marinda Elk, MD   10 mg at 05/15/21 0404   insulin aspart (novoLOG) injection 0-6 Units  0-6 Units Subcutaneous TID AC & HS Shalhoub, Deno Lunger, MD   2 Units at 05/15/21 2318   levothyroxine (SYNTHROID) tablet 100 mcg  100 mcg Oral Q0600 Marinda Elk, MD   100 mcg at 05/16/21 0535   losartan (COZAAR) tablet 100 mg  100 mg Oral QHS Marinda Elk, MD   100 mg at 05/15/21 2319   melatonin tablet 3 mg  3 mg Oral QHS Dahal, Melina Schools, MD   3 mg at 05/15/21 2319   multivitamin with minerals tablet 1 tablet  1 tablet Oral Daily Erick Blinks, MD   1 tablet at 05/16/21 1019   ondansetron (ZOFRAN) tablet 4 mg  4 mg Oral Q6H PRN Shalhoub, Deno Lunger, MD       Or   ondansetron [facility]) injection 4 mg  4 mg Intravenous Q6H PRN Shalhoub, Deno Lunger, MD       polyethylene glycol (MIRALAX / GLYCOLAX) packet 17 g  17 g Oral Daily PRN Shalhoub, Deno Lunger, MD       rosuvastatin (CRESTOR) tablet 5 mg  5 mg Oral Daily Dahal, Binaya, MD   5 mg at 05/16/21 1019     Discharge Medications: Please see discharge summary for a list of discharge medications.  Relevant Imaging Results:  Relevant Lab Results:   Additional Information SS#: [id number]  Kermit Balo, RN

## 2021-05-16 NOTE — Evaluation (Addendum)
Physical Therapy Re-Evaluation Patient Details Name: Tina [name] MRN: [id number] DOB: 18-Oct-1940 Today's Date: 05/16/2021  History of Present Illness  Tina [name] is a 80 y.o. female who presented with increased confusion and lethargy x 1 day along with decline in function over the last couple weeks. 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 CT head 12/20 showing stable size of known subacute white matter infarct. PMH significant for DM2, HOCM, HTN, hypothyroidism, memory changes.  Clinical Impression  Pt with regression since initial PT evaluation 05/14/2021 with acute deficits noted in left sided strength and balance. Pt requiring min assist to ambulate room distances using a walker. She experienced several episodes of lateral loss of balance. She presents as a high fall risk based on decreased gait speed, safety awareness and history of falls. In light of above, updating recommending to SNF to address deficits and maximize functional mobility. Suspect pt will progress well.      Recommendations for follow up therapy are one component of a multi-disciplinary discharge planning process, led by the attending physician.  Recommendations may be updated based on patient status, additional functional criteria and insurance authorization.  Follow Up Recommendations Skilled nursing-short term rehab (<3 hours/day)    Assistance Recommended at Discharge Frequent or constant Supervision/Assistance  Functional Status Assessment Patient has had a recent decline in their functional status and demonstrates the ability to make significant improvements in function in a reasonable and predictable amount of time.  Equipment Recommendations  None recommended by PT    Recommendations for Other Services       Precautions / Restrictions Precautions Precautions: Fall Restrictions Weight Bearing Restrictions: No      Mobility  Bed Mobility Overal bed mobility: Needs Assistance Bed  Mobility: Supine to Sit     Supine to sit: Min assist     General bed mobility comments: Pt initiating, but ultimately, requiring minA at trunk to execute    Transfers Overall transfer level: Needs assistance Equipment used: Rolling walker (2 wheels) Transfers: Sit to/from Stand Sit to Stand: Min assist           General transfer comment: Cues for hand placement, minA to rise and steady from edge of bed and toilet    Ambulation/Gait Ambulation/Gait assistance: Min assist Gait Distance (Feet): 25 Feet Assistive device: Rolling walker (2 wheels);None Gait Pattern/deviations: Step-through pattern;Trunk flexed Gait velocity: decreased Gait velocity interpretation: <1.31 ft/sec, indicative of household ambulator   General Gait Details: Pt with dynamic instability, requiring minA, 2 episodes of lateral LOB. cues for walker proximity and keeping it on the ground  Stairs            Wheelchair Mobility    Modified Rankin (Stroke Patients Only) Modified Rankin (Stroke Patients Only) Pre-Morbid Rankin Score: No significant disability Modified Rankin: Moderately severe disability     Balance Overall balance assessment: Needs assistance;History of Falls Sitting-balance support: Feet supported Sitting balance-Leahy Scale: Poor Sitting balance - Comments: L lateral lean, supervision-min guard assist   Standing balance support: Bilateral upper extremity supported;Reliant on assistive device for balance Standing balance-Leahy Scale: Poor Standing balance comment: washing hands at sink with min guard-minA                             Pertinent Vitals/Pain Pain Assessment: Faces Faces Pain Scale: No hurt    Home Living Family/patient expects to be discharged to:: Private residence Living Arrangements: Spouse/significant other Available Help at Discharge:  Family;Available 24 hours/day Type of Home: House Home Access: Stairs to enter Entrance Stairs-Rails:  Lawyer of Steps: 4 Alternate Level Stairs-Number of Steps: flight down to basement Home Layout: Two level;Able to live on main level with bedroom/bathroom Home Equipment: Standard Walker;Cane - single point;Shower seat;Grab bars - tub/shower;Hand held shower head Additional Comments: pt's husband is "disabled" wtih 2 bad knees and liver disease; pt dos not have to physically lift him but she does "wait on him for every need" per pt's daughter.    Prior Function Prior Level of Function : Needs assist  Cognitive Assist : ADLs (cognitive)   ADLs (Cognitive): Intermittent cues Physical Assist : Mobility (physical);ADLs (physical) Mobility (physical): Gait ADLs (physical): Grooming;Bathing;Dressing;Toileting;IADLs Mobility Comments: uses SPC vs walker, has had multiple falls ADLs Comments: no physical assist needed, but has had multiple falls. daughter assists as needed     Hand Dominance   Dominant Hand: Right    Extremity/Trunk Assessment   Upper Extremity Assessment Upper Extremity Assessment: Defer to OT evaluation    Lower Extremity Assessment Lower Extremity Assessment: RLE deficits/detail;LLE deficits/detail RLE Deficits / Details: Strength 5/5 LLE Deficits / Details: Hip flexion 4-/5, knee extension 5/5, ankle dorsiflexion 5/5    Cervical / Trunk Assessment Cervical / Trunk Assessment: Kyphotic  Communication   Communication: No difficulties  Cognition Arousal/Alertness: Awake/alert Behavior During Therapy: Flat affect Overall Cognitive Status: History of cognitive impairments - at baseline                                 General Comments: decreased STM, follows 1 step commands        General Comments      Exercises     Assessment/Plan    PT Assessment Patient needs continued PT services  PT Problem List Decreased strength;Decreased activity tolerance;Decreased mobility;Decreased cognition;Decreased balance;Decreased  safety awareness       PT Treatment Interventions DME instruction;Gait training;Stair training;Functional mobility training;Therapeutic activities;Therapeutic exercise;Balance training;Patient/family education    PT Goals (Current goals can be found in the Care Plan section)  Acute Rehab PT Goals Patient Stated Goal: pt daughter would like her to go to rehab PT Goal Formulation: With patient Time For Goal Achievement: 05/30/21 Potential to Achieve Goals: Good    Frequency Min 3X/week   Barriers to discharge        Co-evaluation               AM-PAC PT "6 Clicks" Mobility  Outcome Measure Help needed turning from your back to your side while in a flat bed without using bedrails?: None Help needed moving from lying on your back to sitting on the side of a flat bed without using bedrails?: A Little Help needed moving to and from a bed to a chair (including a wheelchair)?: A Little Help needed standing up from a chair using your arms (e.g., wheelchair or bedside chair)?: A Little Help needed to walk in hospital room?: A Little Help needed climbing 3-5 steps with a railing? : A Lot 6 Click Score: 18    End of Session Equipment Utilized During Treatment: Gait belt Activity Tolerance: Patient tolerated treatment well Patient left: in chair;with call bell/phone within reach;with chair alarm set Nurse Communication: Mobility status PT Visit Diagnosis: History of falling (Z91.81);Unsteadiness on feet (R26.81)    Time: 4332-9518 PT Time Calculation (min) (ACUTE ONLY): 28 min   Charges:   PT Evaluation $PT Re-evaluation: 1  Re-eval PT Treatments $Therapeutic Activity: 8-22 mins        Lillia Pauls, PT, DPT Acute Rehabilitation Services Pager [phone] Office [phone]   [name] 05/16/2021, 9:46 AM

## 2021-05-17 ENCOUNTER — Other Ambulatory Visit: Payer: Medicare Other

## 2021-05-17 ENCOUNTER — Encounter (HOSPITAL_COMMUNITY)
Admission: EM | Disposition: A | Discharge status: Skilled Nursing Facility | Payer: Self-pay | Source: Home / Self Care | Attending: Internal Medicine

## 2021-05-17 ENCOUNTER — Other Ambulatory Visit (HOSPITAL_COMMUNITY): Payer: Self-pay

## 2021-05-17 ENCOUNTER — Encounter (HOSPITAL_COMMUNITY): Payer: Self-pay | Admitting: Cardiology

## 2021-05-17 DIAGNOSIS — R4182 Altered mental status, unspecified: Secondary | ICD-10-CM | POA: Diagnosis not present

## 2021-05-17 DIAGNOSIS — G9341 Metabolic encephalopathy: Secondary | ICD-10-CM | POA: Diagnosis not present

## 2021-05-17 HISTORY — PX: LOOP RECORDER INSERTION: EP1214

## 2021-05-17 LAB — GLUCOSE, CAPILLARY
Glucose-Capillary: 127 mg/dL — ABNORMAL HIGH (ref 70–99)
Glucose-Capillary: 168 mg/dL — ABNORMAL HIGH (ref 70–99)
Glucose-Capillary: 160 mg/dL — ABNORMAL HIGH (ref 70–99)
Glucose-Capillary: 135 mg/dL — ABNORMAL HIGH (ref 70–99)
Glucose-Capillary: 157 mg/dL — ABNORMAL HIGH (ref 70–99)
Glucose-Capillary: 144 mg/dL — ABNORMAL HIGH (ref 70–99)

## 2021-05-17 LAB — URINALYSIS, ROUTINE W REFLEX MICROSCOPIC
Bacteria, UA: NONE SEEN
Bilirubin Urine: NEGATIVE
Glucose, UA: NEGATIVE mg/dL
Ketones, ur: NEGATIVE mg/dL
Leukocytes,Ua: NEGATIVE
Nitrite: POSITIVE — AB
Protein, ur: NEGATIVE mg/dL
Specific Gravity, Urine: 1.017 (ref 1.005–1.030)
pH: 5 (ref 5.0–8.0)

## 2021-05-17 SURGERY — LOOP RECORDER INSERTION

## 2021-05-17 MED ORDER — CHLORHEXIDINE GLUCONATE CLOTH 2 % EX PADS
6.0000 | MEDICATED_PAD | Freq: Every day | CUTANEOUS | Status: DC
  Administered 2021-05-17 – 2021-05-31 (×16): 6 via TOPICAL

## 2021-05-17 MED ORDER — HALOPERIDOL LACTATE 5 MG/ML IJ SOLN
2.0000 mg | Freq: Four times a day (QID) | INTRAMUSCULAR | Status: DC | PRN
Start: 2021-05-17 — End: 2021-05-18
  Administered 2021-05-17: 4 mg via INTRAVENOUS
  Filled 2021-05-17: qty 1

## 2021-05-17 MED ORDER — LIDOCAINE-EPINEPHRINE 1 %-1:100000 IJ SOLN
INTRAMUSCULAR | Status: DC | PRN
  Administered 2021-05-17: 10 mL

## 2021-05-17 MED ORDER — TAMSULOSIN HCL 0.4 MG PO CAPS: 0.4000 mg | ORAL_CAPSULE | Freq: Every day | ORAL | Status: AC

## 2021-05-17 MED ORDER — INSULIN ASPART 100 UNIT/ML IJ SOLN
0.0000 [IU] | Freq: Three times a day (TID) | INTRAMUSCULAR | 11 refills | Status: DC
Start: 2021-05-17 — End: 2021-05-31

## 2021-05-17 MED ORDER — LIDOCAINE-EPINEPHRINE 1 %-1:100000 IJ SOLN
INTRAMUSCULAR | Status: AC
  Filled 2021-05-17: qty 1

## 2021-05-17 SURGICAL SUPPLY — 2 items
PACK LOOP INSERTION (CUSTOM PROCEDURE TRAY) ×2 IMPLANT
SYSTEM MONITOR REVEAL LINQ II (Prosthesis & Implant Heart) ×1 IMPLANT

## 2021-05-17 NOTE — Discharge Instructions (Addendum)
Post procedure wound care instructions Keep incision clean and dry for 3 days. Remove outer dressing tomorrow. Leave steri-strips (little pieces of tape) on until seen in the office for wound check appointment. Call the office [phone]) for redness, drainage, swelling, or fever.  Information on my medicine - Paxlovid (nirmatrelvir/ritonavir)  This medication education was reviewed with me or my healthcare representative as part of my discharge preparation.    Why was PAXLOVID prescribed for you? It treats mild to moderate COVID-19.  It may help people who are at high risk of developing severe illness.  The medication works by limiting the spread of the virus in your body.  The FDA has allowed emergency use of this medication.  What do You need to know about PAXLOVID ? Take with or without food.  If it upsets your stomach take with food. This drug comes in a blister card that has both the morning and evening dose in it.  Be sure you know how many tablets to take for each dose and which tablets go together.  If you take this drug the wrong way, it may not work as well or you could have more side effects.  Do not take tablets out of the blister card until you are ready to take your dose. Do not take this drug for longer than you were told by your doctor or pharmacist. This drug interacts with many other drugs.  Check with your doctor or pharmacist to make sure that it is safe for you to add new medications including OTC, natural products and vitamins while you are completing your course of Paxlovid. After getting this drug, you must continue to isolate and do other things to prevent spreading infection to others.  Wear a mask, social distance, do not share personal items, clean and disinfect high touch surfaces, and wash hands often. Tell your doctor if you are pregnant, plan on getting pregnant, or are breast-feeding.  You will need to talk about benefits and risks to you and your baby. Birth  control pills and other hormone-based birth control may not work as well to prevent pregnancy.  Use some other kind of birth control also like a condom when taking this drug.  What do you do if you miss a dose? If you miss a dose, take it as soon as you remember unless it is more than 8 hours late.  If it is more than 8 hours late, skip the missed dose.  Take the next dose at the normal time.  DO NOT TAKE EXTRA or 2 doses at the same time to make up the missed dose.  Important Safety Information Side effects that may happen but usually do not require medical attention - report if they continue:  Change in taste, diarrhea, muscle pain, stomach pain or nausea.  Tell your doctor or get medical help right away if you have any of the following symptoms: Signs of allergic reaction, like rash, hives, itching, red, swollen, blistered, or peeling skin with or without fever, tightness of the chest or throat, trouble breathing. Signs of liver problems like dark urine, tiredness, decreased appetite, light colored stools, throwing up or yellowing of skin or eyes. Sign of high blood pressure like very bad headache or dizziness, passing out, eyesight changes.  This website has more information on Paxlovid (nirmatrelvir & ritonavir) : [email or web address]

## 2021-05-17 NOTE — Progress Notes (Signed)
STROKE TEAM PROGRESS NOTE   INTERVAL HISTORY Her daughter is at the bedside.  Patient is lying comfortably in bed.  She continues to have left leg weakness.  Vital signs stable.  She is   going to rehabilitation in SNF. She is having some urinary retention.  Vital signs stable.  Neuro exam unchanged. Vitals:   05/16/21 2315 05/17/21 0427 05/17/21 0812 05/17/21 1148  BP: (!) 164/70 (!) 144/70 (!) 145/66 (!) 135/47  Pulse: (!) 116 95 100 94  Resp: 19 16 14 14   Temp: 97.8 F (36.6 C) 98 F (36.7 C) 98.5 F (36.9 C) 98.6 F (37 C)  TempSrc: Oral Oral Oral Oral  SpO2: 100% 100% 99% 98%  Weight:      Height:       CBC:  Recent Labs  Lab 05/13/21 1136 05/14/21 0439  WBC 4.4 4.3  NEUTROABS 2.7 2.7  HGB 12.2 11.9*  HCT 39.0 37.3  MCV 80.2 79.0*  PLT 278 [id number]   Basic Metabolic Panel:  Recent Labs  Lab 05/13/21 1136 05/14/21 0439  NA 138 138  K 4.3 3.8  CL 103 106  CO2 31 26  GLUCOSE 139* 123*  BUN 20 14  CREATININE 0.97 0.72  CALCIUM 8.9 8.8*  MG 1.9 1.9   Lipid Panel:  Recent Labs  Lab 05/14/21 0439  CHOL 195  TRIG 62  HDL 72  CHOLHDL 2.7  VLDL 12  LDLCALC 111*   HgbA1c:  Recent Labs  Lab 05/14/21 0439  HGBA1C 9.2*   Urine Drug Screen: No results for input(s): LABOPIA, COCAINSCRNUR, LABBENZ, AMPHETMU, THCU, LABBARB in the last 168 hours.  Alcohol Level No results for input(s): ETH in the last 168 hours.  IMAGING past 24 hours EP PPM/ICD IMPLANT  Result Date: 05/17/2021 SURGEON:  Allegra Lai, MD   PREPROCEDURE DIAGNOSIS:  Cryptogenic Stroke   POSTPROCEDURE DIAGNOSIS:  Cryptogenic Stroke    PROCEDURES:  1. Implantable loop recorder implantation   INTRODUCTION:  Tina [name] is a 80 y.o. female with a history of unexplained stroke who presents today for implantable loop implantation.  The patient has had a cryptogenic stroke.  Despite an extensive workup by neurology, no reversible causes have been identified.  she has worn telemetry during which she did  not have arrhythmias.  There is significant concern for possible atrial fibrillation as the cause for the patients stroke.  The patient therefore presents today for implantable loop implantation.   DESCRIPTION OF PROCEDURE:  Informed written consent was obtained, and the patient was brought to the electrophysiology lab in a fasting state.  The patient required no sedation for the procedure today.  Mapping over the patient's chest was performed by the EP lab staff to identify the area where electrograms were most prominent for ILR recording.  This area was found to be the left parasternal region over the 3rd-4th intercostal space. The patients left chest was therefore prepped and draped in the usual sterile fashion by the EP lab staff. The skin overlying the left parasternal region was infiltrated with lidocaine for local analgesia.  A 0.5-cm incision was made over the left parasternal region over the 3rd intercostal space.  A subcutaneous ILR pocket was fashioned using a combination of sharp and blunt dissection.  A Medtronic Reveal Linq model [place] [id number] implantable loop recorder was then placed into the pocket  R waves were very prominent and measured 0.72mV. EBL<1 ml.  Steri- Strips and a sterile dressing were then applied.  There  were no early apparent complications.   CONCLUSIONS:  1. Successful implantation of a Medtronic Reveal LINQ implantable loop recorder for cryptogenic stroke  2. No early apparent complications.    PHYSICAL EXAM  Temp:  [97.8 F (36.6 C)-99 F (37.2 C)] 98.6 F (37 C) (12/21 1148) Pulse Rate:  [88-116] 94 (12/21 1148) Resp:  [14-19] 14 (12/21 1148) BP: (130-164)/(47-70) 135/47 (12/21 1148) SpO2:  [98 %-100 %] 98 % (12/21 1148)  General - Well nourished, well developed, pleasant elderly Caucasian lady in no apparent distress. Ophthalmologic - fundi not visualized due to noncooperation. Cardiovascular - Regular rhythm and rate.  Mental Status -  Level of arousal  and orientation to time, place, and person were intact. Language including expression, naming, repetition, comprehension was assessed and found intact. Attention span and concentration were normal. Fund of Knowledge was assessed and was intact. Impaired memory  Cranial Nerves II - XII - II - Visual field intact OU. III, IV, VI - Extraocular movements intact. V - Facial sensation intact bilaterally. VII - Facial movement intact bilaterally. VIII - Hearing & vestibular intact bilaterally. X - Palate elevates symmetrically. XI - Chin turning & shoulder shrug intact bilaterally. XII - Tongue protrusion intact.  Motor Strength -  Bulk was normal and fasciculations were absent.  Slight drift in left lower extremity.  Baseline strength in right and left upper extremities and right lower extremity .diminished foot tapping on the left. Motor Tone - Muscle tone was assessed at the neck and appendages and was normal. Reflexes - The patients reflexes were symmetrical in all extremities and she had no pathological reflexes. Sensory - Light touch, temperature/pinprick were assessed and were symmetrical.   Coordination - The patient had normal movements in the hands and feet with no ataxia or dysmetria.  Tremor was absent. Gait and Station - deferred.   ASSESSMENT/PLAN Tina [name] is a 80 y.o. female with history of progressive memory loss, type 2 diabetes, HCOM, hypertension, hypothyroidism presenting with increased confusion, lethargy, weakness and a general decline in function over the last couple weeks.  She is followed with [facility] neurology for progressive memory loss for 2 years.  Her Namenda and Aricept have been recently changed which is what her family initially attributed her change to.  She went for an MRI on Wednesday, 12/14 and her daughter noticed that Tina [name] was having progressive difficulty getting in and out of the car, which is typically not an issue for her. She has also  started using a walker to get around her house. She did have a fall two weeks ago.  The MRI showed a right frontal parasagittal infarct.   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 Occlusion of the left PCA at the P1/P2 junction with diminutive reconstitution of flow distally.  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  40 to 50% stenosis on the right carotid bulb.  MRI- late acute right frontal parasagittal infarct 2D Echo EF 70 to 75%, hyperdynamic function with a left ventricle.  Bubble study negative LDL 111 HgbA1c 9.2 VTE prophylaxis - Lovenox 40 mg daily  aspirin 81 mg daily prior to admission, now on aspirin 81 mg daily and clopidogrel 75 mg daily.  For 3 weeks followed by Plavix alone  therapy recommendations:  SNF Disposition:  SNF  Hypertension Home meds: None Normotension Hydralazine 10 mg q6hr prn  Hyperlipidemia Home meds:  zetia 10mg , resumed in hospital LDL 111, goal < 70 Add atorvastatin 40mg   High intensity statin  Continue statin at discharge  Diabetes type II Uncontrolled Home meds:   HgbA1c 9.2, goal < 7.0 CBGs Recent Labs    05/17/21 0528 05/17/21 1050 05/17/21 1151  GLUCAP 127* 168* 160*    SSI  Other Stroke Risk Factors Advanced Age >/= 65  Coronary artery disease  Other Active Problems Progressive memory loss Namenda 10 mg twice daily and Aricept milligrams twice daily Follows with [facility] day # 3  Patient continues to have left leg weakness hence would benefit to be going to SNF for  rehabilitation.    recommend loop recorder prior to discharge.  Aspirin and Plavix for 3 weeks followed by Plavix alone.  Aggressive risk factor modification.  Long discussion with patient and daughter at bedside and answered questions.  Discussed with Dr.Dahal and patient's daughter.  Stroke team will sign off.  Kindly call for questions greater than 50% time during this 25-minute visit was spent in counseling and coordination of care about her embolic stroke and discussion of stroke prevention and treatment and answering questions.  Antony Contras, MD Medical Director [facility] Pager: [phone] 05/17/2021 2:26 PM    To contact Stroke Continuity provider, please refer to [email or web address] After hours, contact General Neurology

## 2021-05-17 NOTE — Progress Notes (Addendum)
Progress Note  Patient Name: Tina [name] Date of Encounter: 05/17/2021  [facility] Cardiologist: Dr. Acie Fredrickson  Subjective   Looking forward to getting to rehab  Inpatient Medications    Scheduled Meds:  aspirin EC  81 mg Oral q morning   clopidogrel  75 mg Oral Daily   enoxaparin (LOVENOX) injection  40 mg Subcutaneous Q24H   insulin aspart  0-6 Units Subcutaneous TID AC & HS   levothyroxine  100 mcg Oral Q0600   losartan  100 mg Oral QHS   melatonin  3 mg Oral QHS   multivitamin with minerals  1 tablet Oral Daily   phenazopyridine  100 mg Oral TID WC   rosuvastatin  5 mg Oral Daily   tamsulosin  0.4 mg Oral Daily   Continuous Infusions:  sodium chloride 500 mL (05/17/21 0100)   PRN Meds: acetaminophen **OR** acetaminophen, ondansetron **OR** ondansetron (ZOFRAN) IV, polyethylene glycol   Vital Signs    Vitals:   05/16/21 1853 05/16/21 1954 05/16/21 2315 05/17/21 0427  BP: 130/60 140/62 (!) 164/70 (!) 144/70  Pulse: 96 (!) 105 (!) 116 95  Resp: 18 14 19 16   Temp: 97.8 F (36.6 C) 97.9 F (36.6 C) 97.8 F (36.6 C) 98 F (36.7 C)  TempSrc: Oral Oral Oral Oral  SpO2: 99% 100% 100% 100%  Weight:      Height:        Intake/Output Summary (Last 24 hours) at 05/17/2021 [id number] Last data filed at 05/17/2021 0300 Gross per 24 hour  Intake 277 ml  Output --  Net 277 ml   Last 3 Weights 05/13/2021 04/18/2021 04/25/2020  Weight (lbs) 134 lb 7.7 oz 133 lb 143 lb  Weight (kg) 61 kg 60.328 kg 64.864 kg      Telemetry    SR, one very brief wide complex rhythm, slow, slightly irregular, not felt to be clearly AFib - Personally Reviewed with Dr. Curt Bears  ECG    No new EKGs - Personally Reviewed  Physical Exam   GEN: No acute distress.   Neck: No JVD Cardiac: RRR, no murmurs, rubs, or gallops.  Respiratory: CTA b/l. GI: Soft, nontender, non-distended  MS: No edema; No deformity. Neuro:  Nonfocal  Psych: Normal affect   Labs    High Sensitivity  Troponin:  No results for input(s): TROPONINIHS in the last 720 hours.   Chemistry Recent Labs  Lab 05/13/21 1136 05/14/21 0439  NA 138 138  K 4.3 3.8  CL 103 106  CO2 31 26  GLUCOSE 139* 123*  BUN 20 14  CREATININE 0.97 0.72  CALCIUM 8.9 8.8*  MG 1.9 1.9  PROT 6.6 5.9*  ALBUMIN 3.9 3.2*  AST 12* 15  ALT 11 12  ALKPHOS 120 108  BILITOT 0.3 0.5  GFRNONAA 58* >60  ANIONGAP 4* 6    Lipids  Recent Labs  Lab 05/11/21 1104 05/14/21 0439  CHOL 203* 195  TRIG 95 62  HDL 83 72  LABVLDL 17  --   LDLCALC 103* 111*  CHOLHDL 2.4 2.7    Hematology Recent Labs  Lab 05/13/21 1136 05/14/21 0439  WBC 4.4 4.3  RBC 4.86 4.72  HGB 12.2 11.9*  HCT 39.0 37.3  MCV 80.2 79.0*  MCH 25.1* 25.2*  MCHC 31.3 31.9  RDW 16.2* 16.2*  PLT 278 263   Thyroid No results for input(s): TSH, FREET4 in the last 168 hours.  BNPNo results for input(s): BNP, PROBNP in the last 168 hours.  DDimer No results for input(s): DDIMER in the last 168 hours.   Radiology    CT HEAD WO CONTRAST ( ) Result Date: 05/15/2021 CLINICAL DATA:  Stroke, follow-up.  Altered mental status. EXAM: CT HEAD WITHOUT CONTRAST TECHNIQUE: Contiguous axial images were obtained from the base of the skull through the vertex without intravenous contrast. COMPARISON:  CT angiogram head/neck 05/13/2021. Brain MRI 05/10/2021. FINDINGS: Brain: Mild generalized cerebral atrophy. Redemonstrated subacute infarct within the medial right frontal lobe white matter and callosal body (ACA vascular territory). No significant mass effect. No evidence of hemorrhagic conversion. Background mild patchy and ill-defined hypoattenuation within the cerebral white matter, nonspecific but compatible with chronic small vessel ischemic disease. Redemonstrated chronic lacunar infarct within the left caudate head. No interval acute infarct is identified. No extra-axial fluid collection. No evidence of an intracranial mass. No midline shift. Vascular: No  hyperdense vessel.  Atherosclerotic calcifications. Skull: Normal. Negative for fracture or focal lesion. Sinuses/Orbits: Visualized orbits show no acute finding. Scattered mild mucosal thickening and small volume fluid within the bilateral frontal, ethmoid and sphenoid sinuses. Mild mucosal thickening within the visualized bilateral maxillary sinuses. Superimposed small right maxillary sinus mucous retention cyst. IMPRESSION: Stable size of a known subacute white matter infarct within the medial right frontal lobe and callosal body (ACA vascular territory). No significant mass effect or evidence of hemorrhagic conversion. No CT evidence of an interval acute infarct. Redemonstrated chronic lacunar infarct within the left caudate head. Background mild chronic small vessel ischemic changes within the cerebral white matter. Mild generalized cerebral atrophy. Paranasal sinus disease at the imaged levels, as described Electronically Signed   By: Jackey Loge D.O.   On: 05/15/2021 17:21    Cardiac Studies   IMPRESSIONS   1. Left ventricular ejection fraction, by estimation, is 70 to 75%. The  left ventricle has hyperdynamic function. The left ventricle has no  regional wall motion abnormalities. Left ventricular diastolic parameters  are consistent with Grade I diastolic  dysfunction (impaired relaxation). Elevated left ventricular end-diastolic  pressure.   2. Right ventricular systolic function is normal. The right ventricular  size is normal. There is normal pulmonary artery systolic pressure.   3. The mitral valve is normal in structure. Trivial mitral valve  regurgitation.   4. The aortic valve is grossly normal. Aortic valve regurgitation is not  visualized. Mild aortic valve stenosis.   5. Agitated saline contrast bubble study was negative, with no evidence  of any interatrial shunt.   Patient Profile     80 y.o. 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  Agree with above, note added to reflect my findings.  On exam, RRR,  no murmurs, lungs clear, no JVD, no edema.  Patient presented to the hospital with cryptogenic stroke. To date, no cause has been found. Ansen Sayegh plan for LINQ monitor to look for atrial fibrillation. Risks and benefits discussed. Risks include but not limited to bleeding and infection. The patient understands the risks and has agreed to the procedure.  Shakela Donati M. Eugenio Dollins MD 05/17/2021 8:16 AM

## 2021-05-17 NOTE — TOC Transition Note (Addendum)
Transition of Care [facility]) - CM/SW Discharge Note   Patient Details  Name: Tina [name] MRN: [id number] Date of Birth: Apr 06, 1941  Transition of Care [facility] CM/SW Contact:  Kermit Balo, RN Phone Number: 05/17/2021, 2:08 PM   Clinical Narrative:    Patient is discharging to [facility] [place] for rehab. Daughter at the bedside and agreeable. Pt to transport via [facility]. Bedside RN updated and d/c packet is at the desk.  Number for report: [phone]  1600: Discharge held due to hematuria. [facility] cancelled and facility updated.    Final next level of care: Skilled Nursing Facility Barriers to Discharge: No Barriers Identified   Patient Goals and CMS Choice Patient states their goals for this hospitalization and ongoing recovery are:: return home CMS Medicare.gov Compare Post Acute Care list provided to:: Patient Represenative (must comment) Choice offered to / list presented to : Adult Children  Discharge Placement              Patient chooses bed at: [facility] [place] Patient to be transferred to facility by: [facility] Name of family member notified: daughter at the bedside Patient and family notified of of transfer: 05/17/21  Discharge Plan and Services   Discharge Planning Services: CM Consult Post Acute Care Choice: Skilled Nursing Facility                               Social Determinants of Health (SDOH) Interventions     Readmission Risk Interventions No flowsheet data found.

## 2021-05-17 NOTE — Progress Notes (Signed)
Pt has not voided but has attempted to pee multiple times. Bladder scan has shown . On call provider paged to inform

## 2021-05-17 NOTE — Discharge Summary (Signed)
Physician Discharge Summary  IVAL MCCULLOGH RXY:[id number] DOB: [birth year] DOA: 05/13/2021  PCP: Kaleen Mask, MD  Admit date: 05/13/2021 Discharge date: 05/17/2021  Admitted From: Home Discharge disposition: SNF   Code Status: Full Code   Discharge Diagnosis:   Principal Problem:   Acute metabolic encephalopathy Active Problems:   Type 2 diabetes mellitus with hypoglycemia without coma, without long-term current use of insulin (HCC)   Hypothyroidism   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 (HCC)   Mixed diabetic hyperlipidemia associated with type 2 diabetes mellitus (HCC)   Essential hypertension   Frequent falls   Dementia without behavioral disturbance (HCC)   Stroke (cerebrum) [facility])    Chief Complaint  Patient presents with   Altered Mental Status   Brief narrative: DAYSI CAYCE is a 80 y.o. female with PMH significant for DM2, HOCM, HTN, hypothyroidism, dementia Patient presented at [facility] ED on 12/17 for increased confusion and lethargy for a day along with decline in function over the last couple of weeks.   For the last 2 years, patient has been following up with [facility] neurology for progressive memory loss.  As part of the work-up, she had an MRI brain done on 12/14 which showed a late acute R frontal parasagittal infarct for which she was started on DAPT with workup planned outpatient.   In the ED, patient was afebrile, hemodynamically stable CT angio of head and neck showed 1. Evolving subacute infarct in the right frontal lobe periventricular white matter in the ACA distribution as seen on prior MRI from 12/14. No evidence of new infarct or other acute intracranial pathology. 2. Multifocal bilateral intracranial small vessel occlusions, stenosis  3. 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  Patient admitted to  hospitalist service. Neurology consultation was obtained.  Subjective: Patient was seen and examined this morning.  Lying down in bed.  Not in distress.  No new symptoms. Event from last night noted.  Patient retained urine.  Needed INO catheter this morning with release of 650 mill of urine. She was not able to void later this morning again.  Foley catheter was inserted. Explained to daughter at bedside. Underwent loop recorder placement this morning. Stable for discharge to SNF today.  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 -Repeat CT scan of abdomen/19 for recurrent symptoms did not show any new finding. -Stroke work-up was initiated. -A1c 9.2, HDL 72, LDL 111 -Neurology consult appreciated.  PT/OT eval pending. -Echo with EF 70 to 75%, grade 1 diastolic dysfunction, bubble study negative. -Prior to admission, patient was on aspirin 81 mg daily.  Per neurology recommendation, patient will continue dual antiplatelet therapy with aspirin 81 mg daily and Plavix 75 mg daily for 3 weeks followed by Plavix alone.  Continue statin. -PT evaluation was obtained.  SNF recommended -Loop recorder placement by cardiology today.  Acute metabolic encephalopathy Progressively worsening dementia. -Per history, patient has been showing progressive mental decline over the past 1 to 2 years.  Follows up with [facility] as an outpatient.   -Recently initiated on Namenda in November.   -Patient reportedly has poor sleep quality, unable to fall or stay asleep. Gets up several times  in the middle of night, remains exhausted all day.  She takes care of 2 old dogs and taking them out multiple times in the middle of night. Daughter also reports poor  eating habits and patient not taking medications as she should be. Daughter took over meds few weeks ago. Daughter also reports hoarding behavior over the last few months and that patient sleeps on the couch. Daughter also reports that patient rarely drinks water.    Frequent falls -Likely due to progressive physical decline with ongoing dementia.   -PT eval appreciated to continue therapy at SNF.  Urinary retention -Since 12/20, patient complained of urinary frequency, bladder fullness.  Later she was noted to be retaining more than 700 mL of urine.  Needed in and out catheterization.  Unable to void again this morning.  -Foley catheter inserted.  -Can do voiding trial at SNF in a week or follow-up with urologist at [facility] urology for voiding trial. -I have started her on Flomax 0.4 mg daily.  Uncontrolled type 2 diabetes mellitus Hypoglycemia -A1c 9.2 on 05/14/2021.  -Home meds include glipizide and Actos.  Can continue the same post discharge. Recent Labs  Lab 05/16/21 1641 05/16/21 2156 05/17/21 0528 05/17/21 1050 05/17/21 1151  GLUCAP 128* 156* 127* 168* 160*   Hyperlipidemia -Apparently noncompliant to Zetia at home.    Essential hypertension -Losartan    Hypothyroidism Resume home regimen of Synthroid    Mobility: PT eval appreciated Living condition: Was living at home with husband Goals of care:   Code Status: Full Code  Nutritional status: Body mass index is 24.6 kg/m.       Discharge Medications:   Allergies as of 05/17/2021       Reactions   Carvedilol Other (See Comments)   Dizziness   Lipitor [atorvastatin Calcium] Other (See Comments)   Pt doesn't recall   Loratadine Other (See Comments)   Unknown reaction   Metformin And Related Other (See Comments)   Unknown reaction   Pravachol Other (See Comments)   Unknown reaction   Zocor [simvastatin] Other (See Comments)   Unknown reaction   Metoprolol Other (See Comments)   Pt states metoprolol  made her "very tired."        Medication List     TAKE these medications    aspirin 81 MG EC tablet Take 1 tablet (81 mg total) by mouth every morning for 21 days. What changed: additional instructions   clopidogrel 75 MG tablet Commonly known as: Plavix Take 1 tablet (75 mg total) by mouth daily.   donepezil 10 MG tablet Commonly known as: ARICEPT Take 10 mg by mouth 2 (two) times daily.   ezetimibe 10 MG tablet Commonly known as: Zetia Take 1 tablet (10 mg total) by mouth daily.   glipiZIDE 10 MG 24 hr tablet Commonly known as: GLUCOTROL XL Take 20 mg by mouth every morning.   insulin aspart 100 UNIT/ML injection Commonly known as: novoLOG Inject 0-6 Units into the skin 4 (four) times daily -  before meals and at bedtime.   losartan 100 MG tablet Commonly known as: COZAAR Take 100 mg by mouth at bedtime.   melatonin 3 MG Tabs tablet Take 1 tablet (3 mg total) by mouth at bedtime.   memantine 10 MG tablet Commonly known as: Namenda Take 1/2 tab at night for one week. Then take 1/2 tab in the morning and night for one week. Then take 1/2 tab in the morning and 1 tab at night for  one week. Then take 1 tab twice a day   pioglitazone 30 MG tablet Commonly known as: ACTOS Take 30 mg by mouth every morning.   rosuvastatin 5 MG tablet Commonly known as: CRESTOR Take 1 tablet (5 mg total) by mouth daily.   Synthroid 100 MCG tablet Generic drug: levothyroxine Take 1 tablet (100 mcg total) by mouth daily. What changed: when to take this   tamsulosin 0.4 MG Caps capsule Commonly known as: FLOMAX Take 1 capsule (0.4 mg total) by mouth daily. Start taking on: May 18, 2021        Wound care:     Discharge Instructions:   Discharge Instructions     Call MD for:  difficulty breathing, headache or visual disturbances   Complete by: As directed    Call MD for:  extreme fatigue   Complete by: As directed    Call MD for:  hives   Complete by: As  directed    Call MD for:  persistant dizziness or light-headedness   Complete by: As directed    Call MD for:  persistant nausea and vomiting   Complete by: As directed    Call MD for:  severe uncontrolled pain   Complete by: As directed    Call MD for:  temperature >100.4   Complete by: As directed    Diet - low sodium heart healthy   Complete by: As directed    Diet Carb Modified   Complete by: As directed    Discharge instructions   Complete by: As directed    Discharge instructions for diabetes mellitus: Check blood sugar 3 times a day and bedtime at home. If blood sugar running above 200 or less than 70 please call your MD to adjust insulin. If you notice signs and symptoms of hypoglycemia (low blood sugar) like jitteriness, confusion, thirst, tremor and sweating, please check blood sugar, drink sugary drink/biscuits/sweets to increase sugar level and call MD or return to ER.    Discharge instructions for CHF Check weight daily -preferably same time every day. Restrict fluid intake to 1200 ml daily Restrict salt intake to less than 2 g daily. Call MD if you have one of the following symptoms 1) 3 pound weight gain in 24 hours or 5 pounds in 1 week  2) swelling in the hands, feet or stomach  3) progressive shortness of breath 4) if you have to sleep on extra pillows at night in order to breathe     General discharge instructions:  Follow with Primary MD Leonard Downing, MD in 7 days   Get CBC/BMP checked in next visit within 1 week by PCP or SNF MD. (We routinely change or add medications that can affect your baseline labs and fluid status, therefore we recommend that you get the mentioned basic workup next visit with your PCP, your PCP may decide not to get them or add new tests based on their clinical decision)  On your next visit with your PCP, please get your medicines reviewed and adjusted.  Please request your PCP  to go over all hospital tests,  procedures, radiology results at the follow up, please get all Hospital records sent to your PCP by signing hospital release before you go home.  Activity: As tolerated with Full fall precautions use walker/cane & assistance as needed  Avoid using any recreational substances like cigarette, tobacco, alcohol, or non-prescribed drug.  If you experience worsening of your admission symptoms, develop shortness of breath, life threatening  emergency, suicidal or homicidal thoughts you must seek medical attention immediately by calling 911 or calling your MD immediately  if symptoms less severe.  You must read complete instructions/literature along with all the possible adverse reactions/side effects for all the medicines you take and that have been prescribed to you. Take any new medicine only after you have completely understood and accepted all the possible adverse reactions/side effects.   Do not drive, operate heavy machinery, perform activities at heights, swimming or participation in water activities or provide baby sitting services if your were admitted for syncope or siezures until you have seen by Primary MD or a Neurologist and advised to do so again.  Do not drive when taking Pain medications.  Do not take more than prescribed Pain, Sleep and Anxiety Medications  Wear Seat belts while driving.  Please note You were cared for by a hospitalist during your hospital stay. If you have any questions about your discharge medications or the care you received while you were in the hospital after you are discharged, you can call the unit and asked to speak with the hospitalist on call if the hospitalist that took care of you is not available. Once you are discharged, your primary care physician will handle any further medical issues. Please note that NO REFILLS for any discharge medications will be authorized once you are discharged, as it is imperative that you return to your primary care physician (or  establish a relationship with a primary care physician if you do not have one) for your aftercare needs so that they can reassess your need for medications and monitor your lab values.   Increase activity slowly   Complete by: As directed        Follow ups:    Contact information for follow-up providers     [facility]. Schedule an appointment as soon as possible for a visit in 1 week(s).   Specialty: Rehabilitation Contact information: [place] [address] [place], [address] [place] [id number] mc [place], Wilson Oliver, MD Follow up.   Specialty: Family Medicine Contact information: [place] [address] [phone]         Darlin Coco, MD .   Specialty: Cardiology        [facility] Follow up.   Why: In a week for voiding trial Contact information: [place] [phone]             Contact information for after-discharge care     Destination     [facility] Preferred SNF .   Service: Skilled Nursing Contact information: [place] [phone]                     Discharge Exam:   Vitals:   05/16/21 2315 05/17/21 0427 05/17/21 0812 05/17/21 1148  BP: (!) 164/70 (!) 144/70 (!) 145/66 (!) 135/47  Pulse: (!) 116 95 100 94  Resp: 19 16 14 14   Temp: 97.8 F (36.6 C) 98 F (36.7 C) 98.5 F (36.9 C) 98.6 F (37 C)  TempSrc: Oral Oral Oral Oral  SpO2: 100% 100% 99% 98%  Weight:      Height:        Body mass index is 24.6 kg/m.  General exam: Pleasant elderly Caucasian female.  Lying down in bed.  Not in distress.  Daughter at bedside. skin: No rashes, lesions or ulcers. HEENT: Atraumatic, normocephalic, no obvious bleeding Lungs: Clear to auscultation bilaterally CVS: Regular rate and rhythm, no murmur GI/Abd soft,  nontender, nondistended, bowel sound present CNS: Alert, awake, oriented to place, person and with difficulty to time Psychiatry: Mood appropriate Extremities: No pedal edema, no calf tenderness  Time coordinating discharge: 35 minutes   The results of significant diagnostics from this hospitalization (including imaging, microbiology, ancillary and laboratory) are listed below for reference.    Procedures and Diagnostic Studies:   CT ANGIO HEAD NECK W WO CM  Result Date: 05/13/2021 CLINICAL DATA:  Recent stroke, worsening altered mental status, concern for new stroke EXAM: CT ANGIOGRAPHY HEAD AND NECK TECHNIQUE: Multidetector CT imaging of the head and neck was performed using the standard protocol during bolus administration of intravenous contrast. Multiplanar CT image reconstructions and MIPs were obtained to evaluate the vascular anatomy. Carotid stenosis measurements (when applicable) are obtained utilizing NASCET criteria, using the distal internal carotid diameter as the denominator. CONTRAST:  79mL OMNIPAQUE IOHEXOL 350 MG/ML SOLN COMPARISON:  Brain MRI 05/10/2021 FINDINGS: CT HEAD FINDINGS Brain: There is hypodensity in the right frontal lobe white matter abutting the superior margin of the right lateral ventricle consistent with evolving subacute infarct as seen on prior MRI. There is no other evidence of acute infarct. There is no acute intracranial hemorrhage or extra-axial fluid collection. There is mild parenchymal volume loss with prominence of the extra-axial CSF spaces overlying the bilateral cerebral hemispheres. The ventricles are normal in size. There is no mass lesion.  There is no midline shift. Vascular: There is mild calcification of the intracranial ICAs. No dense vessel is seen. Skull: Normal. Negative for fracture or focal lesion. Sinuses: Postsurgical changes are seen in the sinuses. Orbits: Bilateral lens implants are in place. The globes and orbits are otherwise  unremarkable. Review of the MIP images confirms the above findings CTA NECK FINDINGS Aortic arch: There is mild calcified atherosclerotic plaque in the aortic arch. The origins of the major branch vessels are patent. Right carotid system: The right common carotid artery is patent. There is calcified atherosclerotic plaque in the proximal right internal carotid artery resulting in up to approximately 40-50% stenosis. The distal right internal carotid artery is patent. The right external carotid artery is patent. There is no evidence of dissection or aneurysm. Left carotid system: The left common carotid artery is patent. There is mild calcified atherosclerotic plaque in the proximal left internal carotid artery without hemodynamically significant stenosis or occlusion. The distal left internal carotid artery is patent. The left external carotid artery is patent. There is no dissection or aneurysm. Vertebral arteries: The vertebral arteries are patent, without hemodynamically significant stenosis, occlusion, dissection, or aneurysm. Skeleton: There is mild multilevel degenerative change in the cervical spine. There is no visible canal hematoma. There is no acute osseous abnormality or aggressive osseous lesion. Other neck: The thyroid is heterogeneous with partially calcified nodules, the largest measuring up to 1.2 cm. The soft tissues are otherwise unremarkable. Upper chest: The imaged lung apices are clear. Review of the MIP images confirms the above findings CTA HEAD FINDINGS Anterior circulation: There is calcified atherosclerotic plaque in the bilateral intracranial ICAs resulting in up to moderate stenosis of the right ophthalmic segment and moderate stenosis of the left supraclinoid segment. There is a 2 mm inferiorly directed outpouching arising from the left supraclinoid segment (15-152). No definite vessel is seen emanating from the tip of this outpouching. The  bilateral A1 segments are patent. The anterior  communicating artery is patent. The right A2 segment is occluded proximally without appreciable reconstitution of flow. The left ACA is patent throughout. There is an approximately 4 mm segment of moderate to severe stenosis of the left M1 segment. The distal left MCA branches are patent. The right M1 segment is patent. There is severe stenosis of an inferior M2 branch shortly after the bifurcation. The distal right MCA branches are patent. Posterior circulation: The V4 segments are patent. The basilar artery is patent. There is severe stenosis of the left P1 segment with occlusion at the level of the P1/P2 junction (12-144). There is diminutive reconstitution of flow in the distal left PCA (12-117). There is severe focal stenosis at the right P1/P2 junction just inferior to an adjacent traversing vein (12-142). There is reconstitution of flow after the stenosis with more mild multifocal irregularity of the distal branches. Venous sinuses: As permitted by contrast timing, patent. Anatomic variants: None. Review of the MIP images confirms the above findings IMPRESSION: 1. Evolving subacute infarct in the right frontal lobe periventricular white matter in the ACA distribution as seen on prior MRI. No evidence of new infarct or other acute intracranial pathology. 2. Occlusion of the right P2 segment shortly after the anterior communicating artery without appreciable reconstitution of flow. 3. Occlusion of the left PCA at the P1/P2 junction with diminutive reconstitution of flow distally. 4. Other intracranial atherosclerotic disease as above resulting in severe focal stenosis of the right PCA at the P1/P2 junction with reconstitution of flow distally, severe stenosis of an inferior right M2 branch, and moderate stenosis of the intracranial ICAs bilaterally. 5. 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 6. 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 These results were called by telephone at the time of interpretation on 05/13/2021 at 1:39 pm to provider [facility] , who verbally acknowledged these results. Electronically Signed   By: Lesia Hausen M.D.   On: 05/13/2021 13:41   ECHOCARDIOGRAM COMPLETE BUBBLE STUDY  Result Date: 05/14/2021    ECHOCARDIOGRAM REPORT   Patient Name:   ARIE GABLE Date of Exam: 05/14/2021 Medical Rec #:  [id number]     Height:       62.0 in Accession #:    [id number]    Weight:       134.5 lb Date of Birth:  August 04, 1940      BSA:          1.615 m Patient Age:    80 years      BP:           157/72 mmHg Patient Gender: F             HR:           83 bpm. Exam Location:  Inpatient Procedure: 2D Echo and Saline Contrast Bubble Study Indications:    stroke  History:        Patient has prior history of Echocardiogram examinations, most                 recent 02/29/2016. Risk Factors:Hypertension, Diabetes and                 Dyslipidemia.  Sonographer:    Delcie Roch RDCS Referring Phys: [id number] Deno Lunger [place] IMPRESSIONS  1. Left ventricular ejection fraction, by estimation, is 70  to 75%. The left ventricle has hyperdynamic function. The left ventricle has no regional wall motion abnormalities. Left ventricular diastolic parameters are consistent with Grade I diastolic dysfunction (impaired relaxation). Elevated left ventricular end-diastolic pressure.  2. Right ventricular systolic function is normal. The right ventricular size is normal. There is normal pulmonary artery systolic pressure.  3. The mitral valve is normal in structure. Trivial mitral valve regurgitation.  4. The aortic valve is grossly normal. Aortic valve regurgitation is not visualized. Mild aortic valve stenosis.  5. Agitated saline contrast bubble study was negative, with no evidence of any interatrial shunt. FINDINGS  Left Ventricle: Left ventricular ejection  fraction, by estimation, is 70 to 75%. The left ventricle has hyperdynamic function. The left ventricle has no regional wall motion abnormalities. The left ventricular internal cavity size was small. There is no  left ventricular hypertrophy. Left ventricular diastolic parameters are consistent with Grade I diastolic dysfunction (impaired relaxation). Elevated left ventricular end-diastolic pressure. Right Ventricle: The right ventricular size is normal. Right vetricular wall thickness was not well visualized. Right ventricular systolic function is normal. There is normal pulmonary artery systolic pressure. The tricuspid regurgitant velocity is 2.64 m/s, and with an assumed right atrial pressure of 3 mmHg, the estimated right ventricular systolic pressure is 123456 mmHg. Left Atrium: Left atrial size was normal in size. Right Atrium: Right atrial size was normal in size. Pericardium: There is no evidence of pericardial effusion. Mitral Valve: The mitral valve is normal in structure. Trivial mitral valve regurgitation. Tricuspid Valve: The tricuspid valve is grossly normal. Tricuspid valve regurgitation is trivial. Aortic Valve: The aortic valve is grossly normal. Aortic valve regurgitation is not visualized. Mild aortic stenosis is present. Aortic valve mean gradient measures 11.5 mmHg. Aortic valve peak gradient measures 20.9 mmHg. Aortic valve area, by VTI measures 2.68 cm. Pulmonic Valve: The pulmonic valve was normal in structure. Pulmonic valve regurgitation is not visualized. Aorta: The aortic root and ascending aorta are structurally normal, with no evidence of dilitation. IAS/Shunts: No atrial level shunt detected by color flow Doppler. Agitated saline contrast was given intravenously to evaluate for intracardiac shunting. Agitated saline contrast bubble study was negative, with no evidence of any interatrial shunt.  LEFT VENTRICLE PLAX 2D LVIDd:         3.60 cm   Diastology LVIDs:         2.10 cm   LV e'  medial:    7.94 cm/s LV PW:         0.80 cm   LV E/e' medial:  17.3 LV IVS:        0.90 cm   LV e' lateral:   9.03 cm/s LVOT diam:     1.90 cm   LV E/e' lateral: 15.2 LV SV:         113 LV SV Index:   70 LVOT Area:     2.84 cm  RIGHT VENTRICLE             IVC RV S prime:     13.80 cm/s  IVC diam: 1.20 cm TAPSE (M-mode): 1.9 cm LEFT ATRIUM             Index        RIGHT ATRIUM           Index LA diam:        3.70 cm 2.29 cm/m   RA Area:     11.80 cm LA Vol (A2C):   50.2 ml 31.08 ml/m  RA Volume:   27.00 ml  16.72 ml/m LA Vol (A4C):   52.8 ml 32.69 ml/m LA Biplane Vol: 52.8 ml 32.69 ml/m  AORTIC VALVE AV Area (Vmax):    2.51 cm AV Area (Vmean):   2.57 cm AV Area (VTI):     2.68 cm AV Vmax:           228.50 cm/s AV Vmean:          158.000 cm/s AV VTI:            0.422 m AV Peak Grad:      20.9 mmHg AV Mean Grad:      11.5 mmHg LVOT Vmax:         202.00 cm/s LVOT Vmean:        143.000 cm/s LVOT VTI:          0.399 m LVOT/AV VTI ratio: 0.95  AORTA Ao Root diam: 2.70 cm Ao Asc diam:  2.70 cm MITRAL VALVE                TRICUSPID VALVE MV Area (PHT): 4.29 cm     TR Peak grad:   27.9 mmHg MV Decel Time: 177 msec     TR Vmax:        264.00 cm/s MV E velocity: 137.00 cm/s MV A velocity: 201.00 cm/s  SHUNTS MV E/A ratio:  0.68         Systemic VTI:  0.40 m                             Systemic Diam: 1.90 cm Mertie Moores MD Electronically signed by Mertie Moores MD Signature Date/Time: 05/14/2021/11:59:59 AM    Final      Labs:   Basic Metabolic Panel: Recent Labs  Lab 05/13/21 1136 05/14/21 0439  NA 138 138  K 4.3 3.8  CL 103 106  CO2 31 26  GLUCOSE 139* 123*  BUN 20 14  CREATININE 0.97 0.72  CALCIUM 8.9 8.8*  MG 1.9 1.9   GFR Estimated Creatinine Clearance: 46.6 mL/min (by C-G formula based on SCr of 0.72 mg/dL). Liver Function Tests: Recent Labs  Lab 05/13/21 1136 05/14/21 0439  AST 12* 15  ALT 11 12  ALKPHOS 120 108  BILITOT 0.3 0.5  PROT 6.6 5.9*  ALBUMIN 3.9 3.2*   No results  for input(s): LIPASE, AMYLASE in the last 168 hours. Recent Labs  Lab 05/13/21 1136  AMMONIA 31   Coagulation profile Recent Labs  Lab 05/13/21 1136  INR 1.0    CBC: Recent Labs  Lab 05/13/21 1136 05/14/21 0439  WBC 4.4 4.3  NEUTROABS 2.7 2.7  HGB 12.2 11.9*  HCT 39.0 37.3  MCV 80.2 79.0*  PLT 278 263   Cardiac Enzymes: No results for input(s): CKTOTAL, CKMB, CKMBINDEX, TROPONINI in the last 168 hours. BNP: Invalid input(s): POCBNP CBG: Recent Labs  Lab 05/16/21 1641 05/16/21 2156 05/17/21 0528 05/17/21 1050 05/17/21 1151  GLUCAP 128* 156* 127* 168* 160*   D-Dimer No results for input(s): DDIMER in the last 72 hours. Hgb A1c No results for input(s): HGBA1C in the last 72 hours. Lipid Profile No results for input(s): CHOL, HDL, LDLCALC, TRIG, CHOLHDL, LDLDIRECT in the last 72 hours. Thyroid function studies No results for input(s): TSH, T4TOTAL, T3FREE, THYROIDAB in the last 72 hours.  Invalid input(s): FREET3 Anemia work up No results for input(s): VITAMINB12, FOLATE, FERRITIN, TIBC, IRON, RETICCTPCT in the  last 72 hours. Microbiology Recent Results (from the past 240 hour(s))  Resp Panel by RT-PCR (Flu A&B, Covid) Nasopharyngeal Swab     Status: None   Collection Time: 05/13/21  3:07 PM   Specimen: Nasopharyngeal Swab; Nasopharyngeal(NP) swabs in vial transport medium  Result Value Ref Range Status   SARS Coronavirus 2 by RT PCR NEGATIVE NEGATIVE Final    Comment: (NOTE) SARS-CoV-2 target nucleic acids are NOT DETECTED.  The SARS-CoV-2 RNA is generally detectable in upper respiratory specimens during the acute phase of infection. The lowest concentration of SARS-CoV-2 viral copies this assay can detect is 138 copies/mL. A negative result does not preclude SARS-Cov-2 infection and should not be used as the sole basis for treatment or other patient management decisions. A negative result may occur with  improper specimen collection/handling,  submission of specimen other than nasopharyngeal swab, presence of viral mutation(s) within the areas targeted by this assay, and inadequate number of viral copies(<138 copies/mL). A negative result must be combined with clinical observations, patient history, and epidemiological information. The expected result is Negative.  Fact Sheet for Patients:  EntrepreneurPulse.com.au  Fact Sheet for Healthcare Providers:  IncredibleEmployment.be  This test is no t yet approved or cleared by the [place] FDA and  has been authorized for detection and/or diagnosis of SARS-CoV-2 by FDA under an Emergency Use Authorization (EUA). This EUA will remain  in effect (meaning this test can be used) for the duration of the COVID-19 declaration under Section 564(b)(1) of the Act, 21 U.S.C.section 360bbb-3(b)(1), unless the authorization is terminated  or revoked sooner.       Influenza A by PCR NEGATIVE NEGATIVE Final   Influenza B by PCR NEGATIVE NEGATIVE Final    Comment: (NOTE) The Xpert Xpress SARS-CoV-2/FLU/RSV plus assay is intended as an aid in the diagnosis of influenza from Nasopharyngeal swab specimens and should not be used as a sole basis for treatment. Nasal washings and aspirates are unacceptable for Xpert Xpress SARS-CoV-2/FLU/RSV testing.  Fact Sheet for Patients: EntrepreneurPulse.com.au  Fact Sheet for Healthcare Providers: IncredibleEmployment.be  This test is not yet approved or cleared by the [place] FDA and has been authorized for detection and/or diagnosis of SARS-CoV-2 by FDA under an Emergency Use Authorization (EUA). This EUA will remain in effect (meaning this test can be used) for the duration of the COVID-19 declaration under Section 564(b)(1) of the Act, 21 U.S.C. section 360bbb-3(b)(1), unless the authorization is terminated or revoked.  Performed at [facility],  [address] [place], [place] [address]   Resp Panel by RT-PCR (Flu A&B, Covid) Nasopharyngeal Swab     Status: None   Collection Time: 05/16/21  2:03 PM   Specimen: Nasopharyngeal Swab; Nasopharyngeal(NP) swabs in vial transport medium  Result Value Ref Range Status   SARS Coronavirus 2 by RT PCR NEGATIVE NEGATIVE Final    Comment: (NOTE) SARS-CoV-2 target nucleic acids are NOT DETECTED.  The SARS-CoV-2 RNA is generally detectable in upper respiratory specimens during the acute phase of infection. The lowest concentration of SARS-CoV-2 viral copies this assay can detect is 138 copies/mL. A negative result does not preclude SARS-Cov-2 infection and should not be used as the sole basis for treatment or other patient management decisions. A negative result may occur with  improper specimen collection/handling, submission of specimen other than nasopharyngeal swab, presence of viral mutation(s) within the areas targeted by this assay, and inadequate number of viral copies(<138 copies/mL). A negative result must be combined with clinical observations,  patient history, and epidemiological information. The expected result is Negative.  Fact Sheet for Patients:  EntrepreneurPulse.com.au  Fact Sheet for Healthcare Providers:  IncredibleEmployment.be  This test is no t yet approved or cleared by the [place] FDA and  has been authorized for detection and/or diagnosis of SARS-CoV-2 by FDA under an Emergency Use Authorization (EUA). This EUA will remain  in effect (meaning this test can be used) for the duration of the COVID-19 declaration under Section 564(b)(1) of the Act, 21 U.S.C.section 360bbb-3(b)(1), unless the authorization is terminated  or revoked sooner.       Influenza A by PCR NEGATIVE NEGATIVE Final   Influenza B by PCR NEGATIVE NEGATIVE Final    Comment: (NOTE) The Xpert Xpress SARS-CoV-2/FLU/RSV plus assay is intended as an  aid in the diagnosis of influenza from Nasopharyngeal swab specimens and should not be used as a sole basis for treatment. Nasal washings and aspirates are unacceptable for Xpert Xpress SARS-CoV-2/FLU/RSV testing.  Fact Sheet for Patients: EntrepreneurPulse.com.au  Fact Sheet for Healthcare Providers: IncredibleEmployment.be  This test is not yet approved or cleared by the [place] FDA and has been authorized for detection and/or diagnosis of SARS-CoV-2 by FDA under an Emergency Use Authorization (EUA). This EUA will remain in effect (meaning this test can be used) for the duration of the COVID-19 declaration under Section 564(b)(1) of the Act, 21 U.S.C. section 360bbb-3(b)(1), unless the authorization is terminated or revoked.  Performed at [facility], [place] [address] [place], [place] [address]      Signed: Terrilee Croak  [facility] 05/17/2021, 1:16 PM

## 2021-05-17 NOTE — Care Management Important Message (Signed)
Important Message  Patient Details  Name: Tina [name] MRN: [id number] Date of Birth: 1941/05/12   Medicare Important Message Given:  Yes     Iyahna Obriant 05/17/2021, 2:00 PM

## 2021-05-17 NOTE — Plan of Care (Signed)
Pt is alert oriented x 3-4, with periods of confusion. Pt verbalized the desire to pee but was not able to do so. Bladder scan completed, . Provider paged to inform, received order for I&O cath. Pt had of output. Pt also had a burst of afib, provider informed. No distress noted. Pt has IV fluids continued per order.   Problem: Education: Goal: Knowledge of General Education information will improve Description: Including pain rating scale, medication(s)/side effects and non-pharmacologic comfort measures Outcome: Progressing   Problem: Health Behavior/Discharge Planning: Goal: Ability to manage health-related needs will improve Outcome: Progressing   Problem: Clinical Measurements: Goal: Diagnostic test results will improve Outcome: Progressing   Problem: Activity: Goal: Risk for activity intolerance will decrease Outcome: Progressing   Problem: Nutrition: Goal: Adequate nutrition will be maintained Outcome: Progressing   Problem: Elimination: Goal: Will not experience complications related to bowel motility Outcome: Progressing Goal: Will not experience complications related to urinary retention Outcome: Progressing   Problem: Safety: Goal: Ability to remain free from injury will improve Outcome: Progressing   Problem: Skin Integrity: Goal: Risk for impaired skin integrity will decrease Outcome: Progressing

## 2021-05-17 NOTE — Progress Notes (Addendum)
Pr RN page: Pt reportedly with bust of A.Fib on monitor before converting back to NSR.  Asymptomatic at this time.  Will see if they can print a rhythm strip and put in chart.

## 2021-05-18 DIAGNOSIS — R4182 Altered mental status, unspecified: Secondary | ICD-10-CM | POA: Diagnosis not present

## 2021-05-18 LAB — CBC WITH DIFFERENTIAL/PLATELET
Abs Immature Granulocytes: 0 10*3/uL (ref 0.00–0.07)
Basophils Absolute: 0 10*3/uL (ref 0.0–0.1)
Basophils Relative: 0 %
Eosinophils Absolute: 0 10*3/uL (ref 0.0–0.5)
Eosinophils Relative: 0 %
HCT: 35.9 % — ABNORMAL LOW (ref 36.0–46.0)
Hemoglobin: 11.6 g/dL — ABNORMAL LOW (ref 12.0–15.0)
Immature Granulocytes: 0 %
Lymphocytes Relative: 13 %
Lymphs Abs: 0.5 10*3/uL — ABNORMAL LOW (ref 0.7–4.0)
MCH: 25.6 pg — ABNORMAL LOW (ref 26.0–34.0)
MCHC: 32.3 g/dL (ref 30.0–36.0)
MCV: 79.1 fL — ABNORMAL LOW (ref 80.0–100.0)
Monocytes Absolute: 0.5 10*3/uL (ref 0.1–1.0)
Monocytes Relative: 13 %
Neutro Abs: 2.9 10*3/uL (ref 1.7–7.7)
Neutrophils Relative %: 74 %
Platelets: 215 10*3/uL (ref 150–400)
RBC: 4.54 MIL/uL (ref 3.87–5.11)
RDW: 16.1 % — ABNORMAL HIGH (ref 11.5–15.5)
WBC: 4 10*3/uL (ref 4.0–10.5)
nRBC: 0 % (ref 0.0–0.2)

## 2021-05-18 LAB — BASIC METABOLIC PANEL
Anion gap: 8 (ref 5–15)
BUN: 12 mg/dL (ref 8–23)
CO2: 25 mmol/L (ref 22–32)
Calcium: 8.4 mg/dL — ABNORMAL LOW (ref 8.9–10.3)
Chloride: 102 mmol/L (ref 98–111)
Creatinine, Ser: 0.87 mg/dL (ref 0.44–1.00)
GFR, Estimated: >60 mL/min (ref >60)
Glucose, Bld: 170 mg/dL — ABNORMAL HIGH (ref 70–99)
Potassium: 4.1 mmol/L (ref 3.5–5.1)
Sodium: 135 mmol/L (ref 135–145)

## 2021-05-18 LAB — GLUCOSE, CAPILLARY
Glucose-Capillary: 102 mg/dL — ABNORMAL HIGH (ref 70–99)
Glucose-Capillary: 171 mg/dL — ABNORMAL HIGH (ref 70–99)
Glucose-Capillary: 184 mg/dL — ABNORMAL HIGH (ref 70–99)
Glucose-Capillary: 149 mg/dL — ABNORMAL HIGH (ref 70–99)
Glucose-Capillary: 158 mg/dL — ABNORMAL HIGH (ref 70–99)
Glucose-Capillary: 128 mg/dL — ABNORMAL HIGH (ref 70–99)
Glucose-Capillary: 153 mg/dL — ABNORMAL HIGH (ref 70–99)
Glucose-Capillary: 156 mg/dL — ABNORMAL HIGH (ref 70–99)

## 2021-05-18 LAB — LACTIC ACID, PLASMA: Lactic Acid, Venous: 1.1 mmol/L (ref 0.5–1.9)

## 2021-05-18 MED ORDER — WHITE PETROLATUM EX OINT
TOPICAL_OINTMENT | CUTANEOUS | Status: AC
  Administered 2021-05-18: 0.2
  Filled 2021-05-18: qty 28.35

## 2021-05-18 MED ORDER — POLYETHYLENE GLYCOL 3350 17 G PO PACK: 17.0000 g | PACK | Freq: Every day | ORAL | Status: DC | PRN

## 2021-05-18 MED ORDER — CARVEDILOL 3.125 MG PO TABS
3.1250 mg | ORAL_TABLET | Freq: Two times a day (BID) | ORAL | Status: DC
  Administered 2021-05-18 – 2021-05-23 (×12): 3.125 mg via ORAL
  Filled 2021-05-18 (×11): qty 1

## 2021-05-18 MED ORDER — BISACODYL 10 MG RE SUPP
10.0000 mg | Freq: Once | RECTAL | Status: AC
  Administered 2021-05-18: 12:00:00 10 mg via RECTAL
  Filled 2021-05-18: qty 1

## 2021-05-18 MED ORDER — CARVEDILOL 3.125 MG PO TABS
3.1250 mg | ORAL_TABLET | Freq: Two times a day (BID) | ORAL | Status: DC
  Filled 2021-05-18: qty 1

## 2021-05-18 MED ORDER — HALOPERIDOL LACTATE 5 MG/ML IJ SOLN
1.0000 mg | Freq: Four times a day (QID) | INTRAMUSCULAR | Status: DC | PRN
  Filled 2021-05-18 (×2): qty 1

## 2021-05-18 MED ORDER — SODIUM CHLORIDE 0.9 % IV SOLN
1.0000 g | INTRAVENOUS | Status: DC
  Administered 2021-05-18 – 2021-05-22 (×5): 1 g via INTRAVENOUS
  Filled 2021-05-18 (×5): qty 10

## 2021-05-18 NOTE — Plan of Care (Signed)
Pt currently oriented x 4. Pt was restless, pulling telemetry monitor off, trying to get out of bed to go pee. Pt reoriented to indwelling foley she has in place for urinary retention. Pt has not had a bowel movement since 12/17, prn miralax given, no results yet. Pt turned and repositioned. Oral care completed.  Pt also had a temperature of 100.5 oral and has been tachycardic. Pt currently resting. No distress noted.   2357, haldol 4mg  given. Problem: Education: Goal: Knowledge of General Education information will improve Description: Including pain rating scale, medication(s)/side effects and non-pharmacologic comfort measures Outcome: Progressing   Problem: Health Behavior/Discharge Planning: Goal: Ability to manage health-related needs will improve Outcome: Progressing   Problem: Clinical Measurements: Goal: Diagnostic test results will improve Outcome: Progressing   Problem: Activity: Goal: Risk for activity intolerance will decrease Outcome: Progressing   Problem: Nutrition: Goal: Adequate nutrition will be maintained Outcome: Progressing   Problem: Elimination: Goal: Will not experience complications related to bowel motility Outcome: Progressing Goal: Will not experience complications related to urinary retention Outcome: Progressing   Problem: Safety: Goal: Ability to remain free from injury will improve Outcome: Progressing   Problem: Skin Integrity: Goal: Risk for impaired skin integrity will decrease Outcome: Progressing

## 2021-05-18 NOTE — Progress Notes (Signed)
PROGRESS NOTE  Tina [name]  DOB: 1940/08/16  PCP: Leonard Downing, MD [id number]  DOA: 05/13/2021  LOS: 4 days  Hospital Day: 6  Chief Complaint  Patient presents with   Altered Mental Status   Brief narrative: Tina [name] is a 80 y.o. female with PMH significant for DM2, HOCM, HTN, hypothyroidism, dementia Patient presented at [facility] ED on 12/17 for increased confusion and lethargy for a day along with decline in function over the last couple of weeks.   For the last 2 years, patient has been following up with [facility] neurology for progressive memory loss.  As part of the work-up, she had an MRI brain done on 12/14 which showed a late acute R frontal parasagittal infarct for which she was started on DAPT with workup planned outpatient.   In the ED, patient was afebrile, hemodynamically stable CT angio of head and neck showed 1. Evolving subacute infarct in the right frontal lobe periventricular white matter in the ACA distribution as seen on prior MRI from 12/14. No evidence of new infarct or other acute intracranial pathology. 2. Multifocal bilateral intracranial small vessel occlusions, stenosis  3. 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  Patient admitted to hospitalist service. Neurology consultation was obtained.  Subjective: Patient was seen and examined this morning.  Sleeping, opens eyes on verbal command but very weak. Daughter at bedside.  Yesterday, patient was planned for discharge but she had urinary tension.  On insertion of Foley catheter, she started having hematuria.  Discharge was held.   Urinalysis showed positive nitrite.  Patient was started on IV Rocephin. Had an episode of fever last night.  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 -Repeat CT scan of abdomen/19 for recurrent symptoms did not show any new finding. -Stroke work-up was initiated. -A1c 9.2, HDL 72, LDL 111 -Neurology consult appreciated.  PT/OT eval pending. -Echo with EF 70 to [id number], grade 1 diastolic dysfunction, bubble study negative. -On 12/14, she was started on aspirin and Plavix which should be continued along with statin. -PT evaluation was obtained.  SNF recommended  UTI Acute urinary tension -For last 48 hours, patient has urinary frequency, urinary retention, hematuria -Urinalysis 12/21 with positive nitrate.  IV Rocephin started.  Also continue Flomax. -Foley catheter in.  Voiding trial as an outpatient. -UTI probably explains mental status change as well.  Acute metabolic encephalopathy Progressively worsening dementia. -Per history, patient has been showing progressive mental decline over the past 1 to 2 years.  Follows up with [facility] as an outpatient.   -Recently initiated on [place] in November.   -While in the hospital, patient mental status has been gradually worsening.  Probably also contributed to UTI and hospitalization.    Frequent falls -Likely due to progressive physical decline with ongoing dementia.   -PT eval appreciated.  SNF recommended  Uncontrolled type 2 diabetes mellitus Hypoglycemia -A1c 9.2 on 05/14/2021.  -Home meds include glipizide and Actos  -Currently on sliding scale insulin with Accu-Cheks only.  Continue monitor. Recent Labs  Lab 05/17/21 1743 05/17/21 2017 05/17/21 2150 05/18/21 0552 05/18/21 1139  GLUCAP 135* 157* 144* 149* 158*   Hyperlipidemia -Apparently noncompliant to Zetia at home.  Essential hypertension -Losartan    Hypothyroidism Resume home regimen of Synthroid    Mobility: PT eval appreciated Living  condition: Was living at home with husband Goals of care:   Code Status: Full Code  Nutritional status: Body mass index is 24.6 kg/m.      Diet:  Diet Order             Diet Carb Modified           Diet - low sodium heart healthy           Diet heart healthy/carb modified Room service appropriate? Yes; Fluid consistency: Thin  Diet effective now                  DVT prophylaxis:  enoxaparin (LOVENOX) injection 40 mg Start: 05/13/21 2145   Antimicrobials: None Fluid: None Consultants: Neurology Family Communication: Discussed with daughter at bedside  Status is: Observation  Continue in-hospital care because: Unable to discharge today because of UTI and fever. Level of care: Telemetry Medical   Dispo: The patient is from: Home              Anticipated d/c is to: Likely SNF tomorrow.              Patient currently is not medically stable to d/c.   Difficult to place patient No     Infusions:   sodium chloride 500 mL (05/18/21 1035)   cefTRIAXone (ROCEPHIN)  IV 1 g (05/18/21 0901)    Scheduled Meds:  aspirin EC  81 mg Oral q morning   carvedilol  3.125 mg Oral BID WC   Chlorhexidine Gluconate Cloth  6 each Topical Daily   clopidogrel  75 mg Oral Daily   enoxaparin (LOVENOX) injection  40 mg Subcutaneous Q24H   insulin aspart  0-6 Units Subcutaneous TID AC & HS   levothyroxine  100 mcg Oral Q0600   losartan  100 mg Oral QHS   melatonin  3 mg Oral QHS   multivitamin with minerals  1 tablet Oral Daily   phenazopyridine  100 mg Oral TID WC   rosuvastatin  5 mg Oral Daily   tamsulosin  0.4 mg Oral Daily    PRN meds: acetaminophen **OR** acetaminophen, haloperidol lactate, ondansetron **OR** ondansetron (ZOFRAN) IV, polyethylene glycol   Antimicrobials: Anti-infectives (From admission, onward)    Start     Dose/Rate Route Frequency Ordered Stop   05/18/21 0900  cefTRIAXone (ROCEPHIN) 1 g in sodium chloride 0.9 % 100 mL IVPB        1 g 200 mL/hr over 30  Minutes Intravenous Every 24 hours 05/18/21 0810         Objective: Vitals:   05/18/21 0816 05/18/21 1140  BP: (!) 170/65 (!) 141/59  Pulse: 76 95  Resp: 16 14  Temp: 98.8 F (37.1 C) 99.7 F (37.6 C)  SpO2: 96% 97%    Intake/Output Summary (Last 24 hours) at 05/18/2021 1229 Last data filed at 05/18/2021 0400 Gross per 24 hour  Intake --  Output 975 ml  Net -975 ml   Filed Weights   05/13/21 1941  Weight: 61 kg   Weight change:  Body mass index is 24.6 kg/m.   Physical Exam: General exam: Pleasant elderly Caucasian female.  Sleeping, opens eyes on verbal and tired.  Not in visible distress  skin: No rashes, lesions or ulcers. HEENT: Atraumatic, normocephalic, no obvious bleeding Lungs: Clear to auscultation bilaterally CVS: Regular rate and rhythm, no murmur  GI/Abd soft, nontender, nondistended, bowel sound present CNS: Opens eyes on verbal command.  Not in physical distress Psychiatry: Sad affect Extremities: No pedal edema, no calf tenderness  Data Review: I have personally reviewed the laboratory data and studies available.  F/u labs  Unresulted Labs (From admission, onward)     Start     Ordered   05/18/21 1230  CBC with Differential/Platelet  ONCE - STAT,   STAT       Question:  Specimen collection method  Answer:  Lab=Lab collect   05/18/21 1229   05/18/21 [id number]  Basic metabolic panel  ONCE - STAT,   STAT       Question:  Specimen collection method  Answer:  Lab=Lab collect   05/18/21 1229   05/18/21 1230  Lactic acid, plasma  ONCE - STAT,   STAT       Question:  Specimen collection method  Answer:  Lab=Lab collect   05/18/21 1229            Signed, Terrilee Croak, MD [facility] 05/18/2021

## 2021-05-18 NOTE — Progress Notes (Signed)
Physical Therapy Treatment Patient Details Name: Tina [name] MRN: [id number] DOB: 1940-12-19 Today's Date: 05/18/2021   History of Present Illness Tina [name] is a 80 y.o. female who presented with increased confusion and lethargy x 1 day along with decline in function over the last couple weeks. 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 CT head 12/20 showing stable size of known subacute white matter infarct. PMH significant for DM2, HOCM, HTN, hypothyroidism, memory changes.    PT Comments    Pt received in supine, lethargic but awoken with increased time and agreeable to therapy session with encouragement. Pt needing greatly increased time/multimodal cues and physical assist this session compared with previous session. Pt with poor seated balance and poor to zero standing balance with heavy left lean in seated/standing postures and unable to safely use RW for support due to lethargy/LUE and LE weakness. Pt needs totalA to slide LLE for sidestep toward [facility], unsafe to transfer pt OOB to chair during session due to continued lethargy and lack of +2 assist. MD/RN notified of pt need for increased assist today. Pt continues to benefit from PT services to progress toward functional mobility goals.    Recommendations for follow up therapy are one component of a multi-disciplinary discharge planning process, led by the attending physician.  Recommendations may be updated based on patient status, additional functional criteria and insurance authorization.  Follow Up Recommendations  Skilled nursing-short term rehab (<3 hours/day)     Assistance Recommended at Discharge Frequent or constant Supervision/Assistance  Equipment Recommendations  None recommended by PT    Recommendations for Other Services       Precautions / Restrictions Precautions Precautions: Fall Restrictions Weight Bearing Restrictions: No     Mobility  Bed Mobility Overal bed mobility: Needs  Assistance Bed Mobility: Supine to Sit;Sit to Supine     Supine to sit: HOB elevated;Max assist Sit to supine: Max assist   General bed mobility comments: max multimodal cues to initiate and for hand placement/use of bed rail. Pt unable to scoot hips needs totalA for scooting to foot flat    Transfers Overall transfer level: Needs assistance Equipment used: Rolling walker (2 wheels) Transfers: Sit to/from Stand Sit to Stand: Max assist           General transfer comment: EOB<>RW x2 trials, pt with heavy left lean in stance, needs maxA and PTA using body to help block pt L hip to prevent lateral LOB; pt lethargic    Ambulation/Gait     Pre-gait activities: pt able to take 2 sidesteps with RLE toward HOB, needs totalA for weight shifting and manual A to slide LLE for step      Modified Rankin (Stroke Patients Only) Modified Rankin (Stroke Patients Only) Pre-Morbid Rankin Score: No significant disability Modified Rankin: Severe disability     Balance Overall balance assessment: Needs assistance;History of Falls Sitting-balance support: Feet supported Sitting balance-Leahy Scale: Poor Sitting balance - Comments: L lateral lean, min to modA variable; able to correct to midline only briefly when cues given but tends to undercorrect and maintain left lean   Standing balance support: Bilateral upper extremity supported;Reliant on assistive device for balance Standing balance-Leahy Scale: Poor Standing balance comment: max to totalA for standing balance with RW support due to heavy left lean        Cognition Arousal/Alertness: Lethargic Behavior During Therapy: Flat affect Overall Cognitive Status: History of cognitive impairments - at baseline         General  Comments: pt more lethargic, difficulty following some commands, especially when standing; maintains eyes closed throughout session unless cued to open eyes        Exercises Other Exercises Other Exercises:  supine BLE AAROM: ankle pumps, heel slides x5-10 reps ea Other Exercises: seated lateral leans (to R) with elbow taps x 3 trials Other Exercises: STS x2 reps for BLE strengthening    General Comments General comments (skin integrity, edema, etc.): BP 141/59 supine prior to OOB, HR 95-110 bpm with exertion, SpO2 97% on RA; per NT her temp slightly elevated 99.7, RN notified      Pertinent Vitals/Pain Pain Assessment: No/denies pain Faces Pain Scale: No hurt Pain Intervention(s): Monitored during session;Repositioned (heels floated)     PT Goals (current goals can now be found in the care plan section) Acute Rehab PT Goals Patient Stated Goal: pt daughter would like her to go to rehab PT Goal Formulation: With patient Time For Goal Achievement: 05/30/21 Progress towards PT goals: Progressing toward goals    Frequency    Min 3X/week      PT Plan Current plan remains appropriate       AM-PAC PT "6 Clicks" Mobility   Outcome Measure  Help needed turning from your back to your side while in a flat bed without using bedrails?: A Lot Help needed moving from lying on your back to sitting on the side of a flat bed without using bedrails?: A Lot Help needed moving to and from a bed to a chair (including a wheelchair)?: Total Help needed standing up from a chair using your arms (e.g., wheelchair or bedside chair)?: Total Help needed to walk in hospital room?: Total Help needed climbing 3-5 steps with a railing? : Total 6 Click Score: 8    End of Session Equipment Utilized During Treatment: Gait belt Activity Tolerance: Patient limited by lethargy;Patient limited by fatigue Patient left: with call bell/phone within reach;in bed;with bed alarm set (heels floated, bed in chair position, pillow under Lt elbow to prevent L lean) Nurse Communication: Mobility status PT Visit Diagnosis: History of falling (Z91.81);Unsteadiness on feet (R26.81)     Time: [id number] PT Time  Calculation (min) (ACUTE ONLY): 28 min  Charges:  $Therapeutic Exercise: 8-22 mins $Therapeutic Activity: 8-22 mins                     Gabryel Talamo P., PTA Acute Rehabilitation Services Pager: [phone] Office: [place] 05/18/2021, 12:21 PM

## 2021-05-18 NOTE — Progress Notes (Signed)
Occupational Therapy Treatment Patient Details Name: Tina [name] MRN: [id number] DOB: 1940-07-28 Today's Date: 05/18/2021   History of present illness Tina [name] is a 80 y.o. female who presented with increased confusion and lethargy x 1 day along with decline in function over the last couple weeks. 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 CT head 12/20 showing stable size of known subacute white matter infarct. PMH significant for DM2, HOCM, HTN, hypothyroidism, memory changes.   OT comments  Pt with decline in function  this session. She was limited to bed mobility this session and requiring max-total A +2 for grooming and LB ADL's. Pt unable to maintain balance EOB and experiencing decreased activity tolerance. Continuing to recommend SNF level therapies. OT will follow acutely.     Recommendations for follow up therapy are one component of a multi-disciplinary discharge planning process, led by the attending physician.  Recommendations may be updated based on patient status, additional functional criteria and insurance authorization.    Follow Up Recommendations  Skilled nursing-short term rehab (<3 hours/day)    Assistance Recommended at Discharge Frequent or constant Supervision/Assistance  Equipment Recommendations  Other (comment) (TBD)    Recommendations for Other Services      Precautions / Restrictions Precautions Precautions: Fall Restrictions Weight Bearing Restrictions: No       Mobility Bed Mobility Overal bed mobility: Needs Assistance Bed Mobility: Supine to Sit;Sit to Supine     Supine to sit: HOB elevated;Max assist Sit to supine: Max assist   General bed mobility comments: max multimodal cues to initiate and for hand placement/use of bed rail. Pt unable to scoot hips needs totalA for scooting to foot flat    Transfers                         Balance Overall balance assessment: Needs assistance;History of  Falls Sitting-balance support: Feet supported Sitting balance-Leahy Scale: Poor Sitting balance - Comments: L lateral lean, Max A to maintain midline, unable to tolerate for more than 2 mins. Postural control: Left lateral lean;Posterior lean                                 ADL either performed or assessed with clinical judgement   ADL Overall ADL's : Needs assistance/impaired     Grooming: Maximal assistance;Bed level Grooming Details (indicate cue type and reason): Difficulty following commands to complete bed level     Lower Body Bathing: Total assistance;+2 for physical assistance;+2 for safety/equipment;Bed level Lower Body Bathing Details (indicate cue type and reason): Pt unable to assist with LB bathing or rolling in bed                       General ADL Comments: Limitede to bed level due to decline in function    Extremity/Trunk Assessment              Vision       Perception     Praxis      Cognition Arousal/Alertness: Lethargic Behavior During Therapy: Flat affect Overall Cognitive Status: History of cognitive impairments - at baseline                                 General Comments: Pt not opening eyes this session even with commands, following only 50% of  commands this session          Exercises     Shoulder Instructions       General Comments VSS on RA    Pertinent Vitals/ Pain       Pain Assessment: 0-10 Pain Score: 4  Pain Location: Throat and stomach Pain Descriptors / Indicators: Aching;Discomfort;Grimacing Pain Intervention(s): Monitored during session;Repositioned (Told RN)  Home Living                                          Prior Functioning/Environment              Frequency  Min 2X/week        Progress Toward Goals  OT Goals(current goals can now be found in the care plan section)  Progress towards OT goals: Not progressing toward goals - comment  Acute  Rehab OT Goals Patient Stated Goal: none stated OT Goal Formulation: With patient Time For Goal Achievement: 05/28/21 Potential to Achieve Goals: Good ADL Goals Pt Will Perform Grooming: with modified independence;standing Pt Will Perform Lower Body Bathing: with modified independence;sit to/from stand Pt Will Perform Lower Body Dressing: with modified independence;sit to/from stand Pt Will Transfer to Toilet: with modified independence;ambulating Additional ADL Goal #1: Pt will indep verbalize at least 3 fall prevention strategies to apply in the home setting  Plan Discharge plan remains appropriate;Frequency remains appropriate    Co-evaluation                 AM-PAC OT "6 Clicks" Daily Activity     Outcome Measure   Help from another person eating meals?: A Little Help from another person taking care of personal grooming?: A Lot Help from another person toileting, which includes using toliet, bedpan, or urinal?: A Lot Help from another person bathing (including washing, rinsing, drying)?: Total Help from another person to put on and taking off regular upper body clothing?: A Little Help from another person to put on and taking off regular lower body clothing?: A Lot 6 Click Score: 13    End of Session    OT Visit Diagnosis: Unsteadiness on feet (R26.81);Other abnormalities of gait and mobility (R26.89);Repeated falls (R29.6);Muscle weakness (generalized) (M62.81);History of falling (Z91.81);Pain;Hemiplegia and hemiparesis Hemiplegia - Right/Left: Left Hemiplegia - dominant/non-dominant: Non-Dominant Hemiplegia - caused by: Cerebral infarction   Activity Tolerance Patient limited by fatigue;Patient limited by lethargy   Patient Left in bed;with call bell/phone within reach;with bed alarm set   Nurse Communication Mobility status        Time: 6967-8938 OT Time Calculation (min): 15 min  Charges: OT General Charges $OT Visit: 1 Visit OT Treatments $Self  Care/Home Management : 8-22 mins  Tina Keelin H., OTR/L Acute Rehabilitation  Tina [name] 05/18/2021, 6:44 PM

## 2021-05-18 NOTE — TOC Progression Note (Signed)
Transition of Care [facility]) - Progression Note    Patient Details  Name: Tina [name] MRN: [id number] Date of Birth: 12/16/1940  [facility] CM/SW Contact  Kermit Balo, RN Phone Number: 05/18/2021, 11:19 AM  Clinical Narrative:    Pt with a UTI on Rocephin. MD wants to keep another day for treatment. CM has updated [facility] and they will hold a bed for her.  Family updated.  TOC following.   Expected Discharge Plan: Skilled Nursing Facility Barriers to Discharge: No Barriers Identified  Expected Discharge Plan and Services Expected Discharge Plan: Skilled Nursing Facility   Discharge Planning Services: CM Consult Post Acute Care Choice: Skilled Nursing Facility Living arrangements for the past 2 months: Single Family Home Expected Discharge Date: 05/17/21                                     Social Determinants of Health (SDOH) Interventions    Readmission Risk Interventions No flowsheet data found.

## 2021-05-19 ENCOUNTER — Inpatient Hospital Stay (HOSPITAL_COMMUNITY): Payer: Medicare Other

## 2021-05-19 DIAGNOSIS — R4182 Altered mental status, unspecified: Secondary | ICD-10-CM | POA: Diagnosis not present

## 2021-05-19 LAB — GLUCOSE, CAPILLARY
Glucose-Capillary: 123 mg/dL — ABNORMAL HIGH (ref 70–99)
Glucose-Capillary: 134 mg/dL — ABNORMAL HIGH (ref 70–99)
Glucose-Capillary: 141 mg/dL — ABNORMAL HIGH (ref 70–99)
Glucose-Capillary: 168 mg/dL — ABNORMAL HIGH (ref 70–99)
Glucose-Capillary: 181 mg/dL — ABNORMAL HIGH (ref 70–99)

## 2021-05-19 LAB — RESP PANEL BY RT-PCR (FLU A&B, COVID) ARPGX2
Influenza A by PCR: NEGATIVE
Influenza B by PCR: NEGATIVE
SARS Coronavirus 2 by RT PCR: POSITIVE — AB

## 2021-05-19 MED ORDER — INSULIN ASPART 100 UNIT/ML IJ SOLN
0.0000 [IU] | Freq: Three times a day (TID) | INTRAMUSCULAR | Status: DC
  Administered 2021-05-19: 13:00:00 1 [IU] via SUBCUTANEOUS
  Administered 2021-05-22: 17:00:00 3 [IU] via SUBCUTANEOUS
  Administered 2021-05-23 – 2021-05-26 (×9): 1 [IU] via SUBCUTANEOUS
  Administered 2021-05-26: 18:00:00 3 [IU] via SUBCUTANEOUS
  Administered 2021-05-26 – 2021-05-28 (×5): 1 [IU] via SUBCUTANEOUS
  Administered 2021-05-28 – 2021-05-29 (×2): 2 [IU] via SUBCUTANEOUS
  Administered 2021-05-29 – 2021-05-31 (×6): 1 [IU] via SUBCUTANEOUS

## 2021-05-19 MED ORDER — SODIUM CHLORIDE 0.9 % IV SOLN: 100.0000 mg | Freq: Every day | INTRAVENOUS | Status: DC

## 2021-05-19 MED ORDER — LOPERAMIDE HCL 2 MG PO CAPS
2.0000 mg | ORAL_CAPSULE | Freq: Once | ORAL | Status: AC
  Administered 2021-05-19: 13:00:00 2 mg via ORAL
  Filled 2021-05-19: qty 1

## 2021-05-19 MED ORDER — ASPIRIN 81 MG PO CHEW
81.0000 mg | CHEWABLE_TABLET | Freq: Every day | ORAL | Status: DC
  Administered 2021-05-19 – 2021-05-24 (×6): 81 mg via ORAL
  Filled 2021-05-19 (×6): qty 1

## 2021-05-19 MED ORDER — SODIUM CHLORIDE 0.9 % IV SOLN
200.0000 mg | Freq: Once | INTRAVENOUS | Status: DC
  Filled 2021-05-19: qty 40

## 2021-05-19 MED ORDER — NIRMATRELVIR/RITONAVIR (PAXLOVID) TABLET (RENAL DOSING)
2.0000 | ORAL_TABLET | Freq: Two times a day (BID) | ORAL | Status: AC
  Administered 2021-05-19 – 2021-05-24 (×10): 2 via ORAL
  Filled 2021-05-19 (×3): qty 20

## 2021-05-19 NOTE — Plan of Care (Signed)
Pt laying in bed with eyes closed but is responsive to voice, pt is oriented x 3-4,  pt has had diarrhea through the night, pt received suppository and miralax yesterday. Foley catheter in place. Pt was restless pulling gown off, pulling heart monitor leads, placing hands in stool, pulling at foley catheter, and trying to get out of bed stating she needed to pee. Pt reoriented multiple times.     Problem: Education: Goal: Knowledge of General Education information will improve Description: Including pain rating scale, medication(s)/side effects and non-pharmacologic comfort measures Outcome: Progressing   Problem: Health Behavior/Discharge Planning: Goal: Ability to manage health-related needs will improve Outcome: Progressing   Problem: Clinical Measurements: Goal: Diagnostic test results will improve Outcome: Progressing   Problem: Activity: Goal: Risk for activity intolerance will decrease Outcome: Progressing   Problem: Nutrition: Goal: Adequate nutrition will be maintained Outcome: Progressing   Problem: Elimination: Goal: Will not experience complications related to bowel motility Outcome: Progressing Goal: Will not experience complications related to urinary retention Outcome: Progressing   Problem: Safety: Goal: Ability to remain free from injury will improve Outcome: Progressing   Problem: Skin Integrity: Goal: Risk for impaired skin integrity will decrease Outcome: Progressing

## 2021-05-19 NOTE — TOC Progression Note (Signed)
Transition of Care [facility]) - Progression Note    Patient Details  Name: Tina [name] MRN: [id number] Date of Birth: [birth year]  [facility] CM/SW Contact  Kermit Balo, RN Phone Number: 05/19/2021, 10:43 AM  Clinical Narrative:    Patient back positive for covid. Patient unable to d/c to SNF for 10 days from positive covid result. CM has updated [place] [facility] and the family.  TOC following.   Expected Discharge Plan: Skilled Nursing Facility Barriers to Discharge: No Barriers Identified  Expected Discharge Plan and Services Expected Discharge Plan: Skilled Nursing Facility   Discharge Planning Services: CM Consult Post Acute Care Choice: Skilled Nursing Facility Living arrangements for the past 2 months: Single Family Home Expected Discharge Date: 05/17/21                                     Social Determinants of Health (SDOH) Interventions    Readmission Risk Interventions No flowsheet data found.

## 2021-05-19 NOTE — Progress Notes (Signed)
Physical Therapy Treatment Patient Details Name: Tina [name] MRN: [id number] DOB: 04-30-1941 Today's Date: 05/19/2021   History of Present Illness Tina [name] is a 80 y.o. female who presented with increased confusion and lethargy x 1 day along with decline in function over the last couple weeks. 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 CT head 12/20 showing stable size of known subacute white matter infarct. PMH significant for DM2, HOCM, HTN, hypothyroidism, memory changes.    PT Comments    Pt received in supine, lethargic but agreeable to therapy session and son in law present and encouraging. Pt needing maxA for rolling for peri-care as she had small amount of bowel incontinence, then +2 maxA for log roll to/from edge of bed. Pt remained lethargic while seated but following ~50% of commands for LE seated exercises and seated balance tasks/posture training. Pt continues to demonstrate L lean and poor/delayed righting reactions, but is able to assist with RUE to hold herself up when close enough to grab bed rail. Pt with increasing lethargy after sitting up ~8 mins and unable to attempt standing this date due to fatigue and dizziness. BP/VSS on RA seated and supine. Pt continues to benefit from PT services to progress toward functional mobility goals.    Recommendations for follow up therapy are one component of a multi-disciplinary discharge planning process, led by the attending physician.  Recommendations may be updated based on patient status, additional functional criteria and insurance authorization.  Follow Up Recommendations  Skilled nursing-short term rehab (<3 hours/day)     Assistance Recommended at Discharge Frequent or constant Supervision/Assistance  Equipment Recommendations  None recommended by PT    Recommendations for Other Services       Precautions / Restrictions Precautions Precautions: Fall Precaution Comments:  Airborne/Contact Restrictions Weight Bearing Restrictions: No     Mobility  Bed Mobility Overal bed mobility: Needs Assistance Bed Mobility: Rolling;Sidelying to Sit;Sit to Sidelying Rolling: Max assist;+2 for safety/equipment Sidelying to sit: Max assist;+2 for safety/equipment     Sit to sidelying: Max assist;+2 for physical assistance General bed mobility comments: max multimodal cues to initiate and for hand placement/use of bed rail. Pt unable to scoot hips needs totalA for scooting to foot flat and for lateral scooting toward [facility]    Transfers                   General transfer comment: unsafe to attempt today due to increased lethargy, pt unable to follow commands for standing due to fatigue/falling asleep after sitting EOB ~8 mins    Ambulation/Gait                   Stairs             Wheelchair Mobility    Modified Rankin (Stroke Patients Only) Modified Rankin (Stroke Patients Only) Pre-Morbid Rankin Score: No significant disability Modified Rankin: Severe disability     Balance Overall balance assessment: Needs assistance;History of Falls Sitting-balance support: Feet supported Sitting balance-Leahy Scale: Poor Sitting balance - Comments: L lateral and anterior lean, Max A to maintain midline, unable to tolerate for more than 8 mins. Postural control: Left lateral lean (anterior and Lt lean)        Cognition Arousal/Alertness: Lethargic Behavior During Therapy: Flat affect Overall Cognitive Status: History of cognitive impairments - at baseline             General Comments: Pt maintains eyes closed unless instructed to open eyes and then  only briefly opens them; pt with delayed to no righting reactions and weak voice and delayed responses to questions.        Exercises Other Exercises Other Exercises: static sitting with focus on midline posture and cervical extension Other Exercises: seated lateral leans (to R) with elbow  taps x 3 trials Other Exercises: seated BLE AAROM: LAQ, hip flexion (mostly PROM due to poor initiation) x10 reps ea Other Exercises: seated scooting (mostly totalA -transfer pad assist with pt supporting her trunk on bed side rail due to lethargy)    General Comments General comments (skin integrity, edema, etc.): BP 137/80 seated EOB (pt c/o dizziness), BP 129/65 after return to supine; HR 70's-80's bpm and SpO2 WFL on RA. Heels floated in supine and son in law instructed on pressure relief strategies for her      Pertinent Vitals/Pain Pain Assessment: Faces Pain Score: 4  Pain Location: pt unable to localize, grimacing at times Pain Descriptors / Indicators: Grimacing Pain Intervention(s): Limited activity within patient's tolerance;Monitored during session;Repositioned           PT Goals (current goals can now be found in the care plan section) Acute Rehab PT Goals Patient Stated Goal: pt daughter would like her to go to rehab PT Goal Formulation: With patient Time For Goal Achievement: 05/30/21 Progress towards PT goals: Not progressing toward goals - comment (lethargy post-covid dx limiting progress)    Frequency    Min 3X/week      PT Plan Current plan remains appropriate       AM-PAC PT "6 Clicks" Mobility   Outcome Measure  Help needed turning from your back to your side while in a flat bed without using bedrails?: Total Help needed moving from lying on your back to sitting on the side of a flat bed without using bedrails?: Total Help needed moving to and from a bed to a chair (including a wheelchair)?: Total Help needed standing up from a chair using your arms (e.g., wheelchair or bedside chair)?: Total Help needed to walk in hospital room?: Total Help needed climbing 3-5 steps with a railing? : Total 6 Click Score: 6    End of Session   Activity Tolerance: Patient limited by lethargy;Patient limited by fatigue Patient left: in bed;with call bell/phone  within reach;with bed alarm set;with family/visitor present;Other (comment) (pt heels floated, son in law present) Nurse Communication: Mobility status PT Visit Diagnosis: History of falling (Z91.81);Unsteadiness on feet (R26.81)     Time: 8469-6295 PT Time Calculation (min) (ACUTE ONLY): 26 min  Charges:  $Therapeutic Exercise: 8-22 mins $Therapeutic Activity: 8-22 mins                     Shatasia Cutshaw P., PTA Acute Rehabilitation Services Pager: [phone] Office: [phone]    Angus Palms 05/19/2021, 4:38 PM

## 2021-05-19 NOTE — Progress Notes (Addendum)
PROGRESS NOTE  Tina [name]  DOB: [birth year]  PCP: Leonard Downing, MD [id number]  DOA: 05/13/2021  LOS: 5 days  Hospital Day: 7  Chief Complaint  Patient presents with   Altered Mental Status   Brief narrative: Tina [name] is a 80 y.o. female with PMH significant for DM2, HOCM, HTN, hypothyroidism, dementia Patient presented at [facility] ED on 12/17 for increased confusion and lethargy for a day along with decline in function over the last couple of weeks.   For the last 2 years, patient has been following up with [facility] neurology for progressive memory loss.  As part of the work-up, she had an MRI brain done on 12/14 which showed a late acute R frontal parasagittal infarct for which she was started on DAPT with workup planned outpatient.   In the ED, patient was afebrile, hemodynamically stable CT angio of head and neck showed 1. Evolving subacute infarct in the right frontal lobe periventricular white matter in the ACA distribution as seen on prior MRI from 12/14. No evidence of new infarct or other acute intracranial pathology. 2. Multifocal bilateral intracranial small vessel occlusions, stenosis  3. 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  Patient admitted to hospitalist service. Neurology consultation was obtained.  Patient completed a stroke work-up.  PT recommended SNF.  Discharge was delayed because of UTI and again due to COVID test coming back positive.  Patient also has been getting progressively weak during the interval probably contributed by both COVID and UTI.  Subjective: Patient was seen and examined this morning.  Sleepy, lying on bed.  Not in distress.  Opens eyes on verbal command.  Son-in-law at bedside. 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 -Repeat CT scan of abdomen/19 for recurrent symptoms did not show any new finding. -Stroke work-up was initiated. -A1c 9.2, HDL 72, LDL 111 -Neurology consult appreciated.  PT/OT eval pending. -Echo with EF 70 to [id number], grade 1 diastolic dysfunction, bubble study negative. -On 12/14, she was started on aspirin and Plavix which should be continued along with statin. -PT evaluation was obtained.  SNF recommended  UTI Acute urinary retention -Dysuria, frequency, urinary retention, positive urinalysis on 12/21.  Urine culture sent.  Currently on IV Rocephin.   -Foley catheter inserted.  I had initiated her on Flomax but with orthostatic hypotension, I stopped Flomax.    COVID infection -No respiratory symptoms.  COVID test done for discharge on 12/22 came positive. -Part of her weakness could be extremely COVID as well.  Obtain chest x-ray. -Start on a course of Paxlovid.  No steroids as he is not hypoxic.  Acute metabolic encephalopathy Progressively worsening dementia. -Per history, patient has been showing progressive mental decline over the past 1 to 2 years.  Follows up with [facility] as an outpatient.   -Recently initiated on [place] in November.   -While in the hospital, patient mental status has been gradually worsening.  Probably also contributed by UTI, COVID, stroke and prolonged hospitalization. -Because of increased sleepiness and poor hydration, patient has dark urine, at risk of further  dehydration.  I started her on NS at 50 mill per hour.  Reassess in next 24 hours.  Diarrhea -Patient has had frequent loose bowel movement in last 24 hours.  It actually started after she was given laxative yesterday.   Currently not on any stool softeners or laxative.  At family's request, I am giving patient 1 dose of Imodium today.    Frequent falls -Likely due to progressive physical decline with ongoing dementia.   -PT eval appreciated.  SNF recommended  Uncontrolled type 2 diabetes mellitus Hypoglycemia -A1c 9.2 on 05/14/2021.  -Home meds include glipizide and Actos  -Currently on sliding scale insulin with Accu-Cheks only.  Continue monitor. Recent Labs  Lab 05/18/21 1802 05/18/21 2135 05/19/21 0025 05/19/21 0521 05/19/21 1242  GLUCAP 153* 156* 181* 141* 168*   Hyperlipidemia -Crestor    Essential hypertension -Controlled on Coreg and losartan    Hypothyroidism Resume home regimen of Synthroid    Mobility: PT eval appreciated Living condition: Was living at home with husband Goals of care:   Code Status: Full Code  Nutritional status: Body mass index is 24.6 kg/m.      Diet:  Diet Order             Diet Carb Modified           Diet - low sodium heart healthy           Diet heart healthy/carb modified Room service appropriate? Yes; Fluid consistency: Thin  Diet effective now                  DVT prophylaxis:  enoxaparin (LOVENOX) injection 40 mg Start: 05/13/21 2145   Antimicrobials: None Fluid: None Consultants: Neurology Family Communication: Discussed with daughter at bedside  Status is: Observation  Continue in-hospital care because: SNF discharge held because of COVID-positive result Level of care: Telemetry Medical   Dispo: The patient is from: Home              Anticipated d/c is to: SNF after 10 days of isolation completion              Patient currently is not medically stable to d/c.   Difficult to place patient No     Infusions:   sodium chloride 500 mL (05/18/21 1035)   cefTRIAXone (ROCEPHIN)  IV 1 g (05/19/21 4034)    Scheduled Meds:  aspirin  81 mg Oral Daily   carvedilol  3.125 mg Oral BID WC   Chlorhexidine Gluconate Cloth  6  each Topical Daily   clopidogrel  75 mg Oral Daily   enoxaparin (LOVENOX) injection  40 mg Subcutaneous Q24H   insulin aspart  0-6 Units Subcutaneous TID AC & HS   levothyroxine  100 mcg Oral Q0600   loperamide  2 mg Oral Once   losartan  100 mg Oral QHS   melatonin  3 mg Oral QHS   multivitamin with minerals  1 tablet Oral Daily   rosuvastatin  5 mg Oral Daily    PRN meds: acetaminophen **OR** acetaminophen, haloperidol lactate, ondansetron **OR** ondansetron (ZOFRAN) IV   Antimicrobials: Anti-infectives (From admission, onward)    Start     Dose/Rate Route Frequency Ordered Stop   05/18/21 0900  cefTRIAXone (ROCEPHIN) 1 g in sodium chloride 0.9 % 100 mL IVPB        1 g 200 mL/hr over 30 Minutes Intravenous Every 24 hours 05/18/21 0810         Objective: Vitals:  05/19/21 0746 05/19/21 1237  BP: (!) 156/71 (!) 110/53  Pulse: (!) 106 75  Resp: 19 17  Temp: 100.3 F (37.9 C) 98.9 F (37.2 C)  SpO2: 96% 97%    Intake/Output Summary (Last 24 hours) at 05/19/2021 1255 Last data filed at 05/19/2021 1239 Gross per 24 hour  Intake 1487.67 ml  Output 600 ml  Net 887.67 ml   Filed Weights   05/13/21 1941  Weight: 61 kg   Weight change:  Body mass index is 24.6 kg/m.   Physical Exam: General exam: Pleasant elderly Caucasian female.  Sleeping, opens eyes on verbal command.  Looks weak. skin: No rashes, lesions or ulcers. HEENT: Atraumatic, normocephalic, no obvious bleeding Lungs: Poor respiratory effort.  Clear to auscultation bilaterally CVS: Regular rate and rhythm, no murmur GI/Abd soft, nontender, nondistended, bowel sound present CNS: Opens eyes on verbal command.  Not in physical distress Psychiatry: Sad affect Extremities: No pedal edema, no calf tenderness  Data Review: I have personally reviewed the laboratory data and studies available.  F/u labs  Unresulted Labs (From admission, onward)     Start     Ordered   05/20/21 0500  CBC with  Differential/Platelet  Tomorrow morning,   R       Question:  Specimen collection method  Answer:  Lab=Lab collect   05/19/21 1250   05/20/21 [id number]  Basic metabolic panel  Tomorrow morning,   R       Question:  Specimen collection method  Answer:  Lab=Lab collect   05/19/21 1250   05/20/21 0500  C-reactive protein  Tomorrow morning,   R       Question:  Specimen collection method  Answer:  Lab=Lab collect   05/19/21 1250   05/20/21 0500  Ferritin  Tomorrow morning,   R       Question:  Specimen collection method  Answer:  Lab=Lab collect   05/19/21 1250   05/19/21 0755  Urine Culture  Once,   R       Question:  Indication  Answer:  Altered mental status (if no other cause identified)   05/19/21 0754            Signed, Terrilee Croak, MD [facility] 05/19/2021

## 2021-05-19 NOTE — Progress Notes (Signed)
Pt temp was 100.3, pt c/o having a really sore throat this morning and dry cough. Pt has had increased weakness the past few days. Pt stated she hasnt had much of an appetite and feels weak. On call provider paged to inform, received order for COVID test.

## 2021-05-20 ENCOUNTER — Inpatient Hospital Stay (HOSPITAL_COMMUNITY): Payer: Medicare Other

## 2021-05-20 DIAGNOSIS — N390 Urinary tract infection, site not specified: Secondary | ICD-10-CM | POA: Diagnosis not present

## 2021-05-20 DIAGNOSIS — G9341 Metabolic encephalopathy: Secondary | ICD-10-CM | POA: Diagnosis not present

## 2021-05-20 DIAGNOSIS — R4182 Altered mental status, unspecified: Secondary | ICD-10-CM | POA: Diagnosis not present

## 2021-05-20 DIAGNOSIS — U071 COVID-19: Secondary | ICD-10-CM | POA: Diagnosis not present

## 2021-05-20 LAB — CBC WITH DIFFERENTIAL/PLATELET
Abs Immature Granulocytes: 0.01 K/uL (ref 0.00–0.07)
Basophils Absolute: 0 K/uL (ref 0.0–0.1)
Basophils Relative: 0 %
Eosinophils Absolute: 0 K/uL (ref 0.0–0.5)
Eosinophils Relative: 0 %
HCT: 37 % (ref 36.0–46.0)
Hemoglobin: 12.2 g/dL (ref 12.0–15.0)
Immature Granulocytes: 0 %
Lymphocytes Relative: 19 %
Lymphs Abs: 0.9 K/uL (ref 0.7–4.0)
MCH: 25.7 pg — ABNORMAL LOW (ref 26.0–34.0)
MCHC: 33 g/dL (ref 30.0–36.0)
MCV: 78.1 fL — ABNORMAL LOW (ref 80.0–100.0)
Monocytes Absolute: 0.6 K/uL (ref 0.1–1.0)
Monocytes Relative: 13 %
Neutro Abs: 3.2 K/uL (ref 1.7–7.7)
Neutrophils Relative %: 68 %
Platelets: 205 K/uL (ref 150–400)
RBC: 4.74 MIL/uL (ref 3.87–5.11)
RDW: 15.9 % — ABNORMAL HIGH (ref 11.5–15.5)
WBC: 4.7 K/uL (ref 4.0–10.5)
nRBC: 0 % (ref 0.0–0.2)

## 2021-05-20 LAB — BASIC METABOLIC PANEL WITH GFR
Anion gap: 9 (ref 5–15)
BUN: 10 mg/dL (ref 8–23)
CO2: 25 mmol/L (ref 22–32)
Calcium: 8.3 mg/dL — ABNORMAL LOW (ref 8.9–10.3)
Chloride: 104 mmol/L (ref 98–111)
Creatinine, Ser: 0.83 mg/dL (ref 0.44–1.00)
GFR, Estimated: >60 mL/min
Glucose, Bld: 136 mg/dL — ABNORMAL HIGH (ref 70–99)
Potassium: 3.7 mmol/L (ref 3.5–5.1)
Sodium: 138 mmol/L (ref 135–145)

## 2021-05-20 LAB — GLUCOSE, CAPILLARY
Glucose-Capillary: 130 mg/dL — ABNORMAL HIGH (ref 70–99)
Glucose-Capillary: 145 mg/dL — ABNORMAL HIGH (ref 70–99)
Glucose-Capillary: 126 mg/dL — ABNORMAL HIGH (ref 70–99)
Glucose-Capillary: 115 mg/dL — ABNORMAL HIGH (ref 70–99)

## 2021-05-20 LAB — FERRITIN: Ferritin: 678 ng/mL — ABNORMAL HIGH (ref 11–307)

## 2021-05-20 LAB — C-REACTIVE PROTEIN: CRP: 4.9 mg/dL — ABNORMAL HIGH (ref <1.0)

## 2021-05-20 MED ORDER — IOPAMIDOL (ISOVUE-370) INJECTION 76%
75.0000 mL | Freq: Once | INTRAVENOUS | Status: AC | PRN
  Administered 2021-05-20: 75 mL via INTRAVENOUS

## 2021-05-20 NOTE — Assessment & Plan Note (Signed)
A1c of 9.2.  Home medications on glipizide and Actos.  Currently on sliding scale.

## 2021-05-20 NOTE — Assessment & Plan Note (Addendum)
Initially on losartan and Coreg.  Initially had some permissive hypertension as stable.  As above, episodes of hypotension causing worsening of stroke.  Medication stopped due to hypotension.  Have had to start low-dose midodrine.

## 2021-05-20 NOTE — Assessment & Plan Note (Signed)
Due to progressive physical decline.  For skilled nursing.

## 2021-05-20 NOTE — Assessment & Plan Note (Addendum)
Incidentally found on skilled nursing screening.  Finished 10-day quarantine on 1/1.  Asymptomatic.  No respiratory issues.  Finished course of Paxlovid.  CRP at 1.

## 2021-05-20 NOTE — Progress Notes (Signed)
[facility] Progress Note  Patient: Tina [name]    [id number]  DOA: 05/13/2021    Date of Service: the patient was seen and examined on 05/20/2021  Brief hospital course: 80 year old female with past medical history for diabetes mellitus, HOCM, hypertension, hypothyroidism and dementia presented to the emergency room on 12/17 for increased and confusion x1 day with functional decline for the past few weeks.  Recent MRI noted late acute right frontal parasagittal infarct.  CT angiogram noted evolving subacute infarct, but no acute finding.  Patient underwent stroke work-up with hospitalist complicated by UTI.  PT recommended skilled nursing however clearance COVID test done on 12/23 positive.  Patient started on Paxlovid.  Assessment and Plan: Cardiovascular and Mediastinum Essential hypertension Assessment & Plan Continue losartan and Coreg.  Initially had some permissive hypertension as stable.  * 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 [facility]) Assessment & Plan MRI of brain from 12/14 and CT angiogram of head and neck from 12/17 noting evolving subacute infarct in right frontal lobe.  A1c of 9.2.  LDL of 111.  PT and OT recommending skilled nursing.  Echocardiogram noted preserved ejection fraction with negative bubble study.  On aspirin and Plavix plus statin.  Endocrine Hypothyroidism Assessment & Plan Continue Synthroid  Mixed diabetic hyperlipidemia associated with type 2 diabetes mellitus ([place]) Assessment & Plan On statin.  Type 2 diabetes mellitus with hypoglycemia without coma, without long-term current use of insulin (HCC) Assessment & Plan A1c of 9.2.  Home medications on glipizide and Actos.  Currently on sliding scale.  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  On Namenda.  Acute behavioral disturbances hopefully she will improve as stroke evolves and COVID/UTI treated  Acute metabolic  encephalopathy Assessment & Plan Patient overall has had some progressive mental decline over the past 1 to 2 years.  Recently started on Namenda.  Mental status has been worsening during hospitalization likely from stroke plus COVID plus UTI and prolonged hospitalization.  Trying to avoid sedatives.  Genitourinary Acute lower UTI Assessment & Plan Positive urinalysis on 12/21.  Urine culture is unremarkable.  On IV Rocephin.  Other Frequent falls Assessment & Plan Due to progressive physical decline.  For skilled nursing.  COVID-19 virus infection Assessment & Plan Incidentally found on skilled nursing screening.  Now on 10-day quarantine.  Asymptomatic.  No respiratory issues.  On Paxlovid.  Following CRP    Body mass index is 24.6 kg/m.        Consultants: Neurology  Procedures: Echocardiogram  Antimicrobials: IV Rocephin 12/22-12/26  Code Status: Full code   Subjective: Patient does not open eyes, but does respond to questions, no acute distress  Objective: Vital signs were reviewed and unremarkable. Vitals:   05/20/21 1224 05/20/21 1500  BP: (!) 111/56 (!) 148/57  Pulse: 80 78  Resp: 14 16  Temp: 99.1 F (37.3 C) 99.4 F (37.4 C)  SpO2: 98% 97%    Intake/Output Summary (Last 24 hours) at 05/20/2021 1654 Last data filed at 05/20/2021 1039 Gross per 24 hour  Intake 751.82 ml  Output 2125 ml  Net -1373.18 ml   Filed Weights   05/13/21 1941  Weight: 61 kg   Body mass index is 24.6 kg/m.  Exam:  General: Alert and oriented x2, no acute distress HEENT: Normocephalic and atraumatic, mucous memories slightly dry Cardiovascular: Regular rate and rhythm, S1-S2 Respiratory: Clear to auscultation bilaterally Abdomen: Soft, nontender, nondistended, hypoactive bowel sounds Musculoskeletal: No clubbing  or cyanosis, trace pitting edema Skin: No skin breaks, tears or lesions Psychiatry: Appropriate, no evidence of psychoses Neurology: Generalized  weakness, no focal  Data Reviewed: My review of labs, imaging, notes and other tests shows no new significant findings.   Disposition:  Status is: Inpatient  Remains inpatient appropriate because: Needs skilled nursing  Family Communication: Daughter at bedside  DVT Prophylaxis: enoxaparin (LOVENOX) injection 40 mg Start: 05/13/21 2145    Author: Hollice Espy ,MD 05/20/2021 4:54 PM  To reach On-call, see care teams to locate the attending and reach out via [email or web address]. Between 7PM-7AM, please contact night-coverage If you still have difficulty reaching the attending provider, please page the [facility] (Director on Call) for [facility] on amion for assistance.

## 2021-05-20 NOTE — Assessment & Plan Note (Addendum)
Positive urinalysis on 12/21.  Urine culture is unremarkable.  Completed course of IV Rocephin.  Unfortunately, still continuing to require Foley due to urinary retention.  Started on suppressive Macrodantin.  Have also stopped her Flomax which may be causing some of her hypotension.

## 2021-05-20 NOTE — Hospital Course (Addendum)
80 year old female with past medical history for diabetes mellitus, HOCM, hypertension, hypothyroidism and dementia presented to the emergency room on 12/17 for increased and confusion x1 day with functional decline for the past few weeks.  Recent MRI noted late acute right frontal parasagittal infarct.  CT angiogram noted evolving subacute infarct, but no acute finding.  Patient underwent stroke work-up with hospitalist complicated by UTI.  PT recommended skilled nursing however clearance COVID test done on 12/23 positive and outpatient remains in hospital for 10-day quarantine.  Patient started on Paxlovid.  Since 12/26, having episodes of headache and increased sundowning.  Since 12/27 evening, patient has had episodes of  "hypotension" being evens as sensitive as systolic in the 120s despite being almost 2 weeks from her CVA, these episodes of lower blood pressure cause the patient to have evolving stroke (confirmed on MRI) and even a new small tiny stroke seen.  In discussion with neurology, they feel that her pressures need to be much higher.  To that end, have tried IV fluids which helped marginally but pressure significantly improved with midodrine.  When patient has episodes of worsening stroke, her left upper extremity becomes very weak  (which is new for her from the original CVA), but symptoms improve when pressures are higher, such as in the 150s.  Attempts to wean off or lessen dose of midodrine leads to episodes of hypotension.  In discussion with family the plan will be to keep midodrine going and look at weaning off perhaps in a month or so.

## 2021-05-20 NOTE — Assessment & Plan Note (Signed)
On statin.

## 2021-05-20 NOTE — Assessment & Plan Note (Addendum)
Patient overall has had some progressive mental decline over the past 1 to 2 years.  Recently started on Namenda.  Mental status a little worse from baseline during hospitalization likely from stroke plus COVID plus UTI and prolonged hospitalization, especially at night.  More appropriate during the day.  Has been more tired and gets a little bit more somnolent when she becomes borderline hypotensive.

## 2021-05-20 NOTE — Assessment & Plan Note (Signed)
Continue Synthroid °

## 2021-05-20 NOTE — Assessment & Plan Note (Addendum)
MRI of brain from 12/14 and CT angiogram of head and neck from 12/17 noting evolving subacute infarct in right frontal lobe.  A1c of 9.2.  LDL of 111.  PT and OT recommending skilled nursing.  Echocardiogram noted preserved ejection fraction with negative bubble study.  On aspirin and Plavix plus statin.  Now with new left upper extremity weakness and MRI notes worsening progression of CVA plus new small acute CVA.  Neurology feels this may be from hypotension, perhaps from episodes of rapid atrial fibrillation?  Initially started on midodrine which she is keeping her blood pressure up leading to increased strength.  Attempts to decrease or wean off midodrine only lead to more episodes of hypotension, decreased level of responsiveness and left upper extremity weakness.  Repeat MRI ordered today and results are pending.  Have resumed IV fluids and midodrine back at 2.5 mg twice daily.  We will try to wean off IV fluids tomorrow.

## 2021-05-20 NOTE — Assessment & Plan Note (Addendum)
As above.  On Namenda.  Occasional behavioral disturbances, especially at night, hopefully she will improve as stroke evolves and COVID/UTI treated

## 2021-05-21 DIAGNOSIS — N390 Urinary tract infection, site not specified: Secondary | ICD-10-CM | POA: Diagnosis not present

## 2021-05-21 DIAGNOSIS — R4182 Altered mental status, unspecified: Secondary | ICD-10-CM | POA: Diagnosis not present

## 2021-05-21 DIAGNOSIS — F03918 Unspecified dementia, unspecified severity, with other behavioral disturbance: Secondary | ICD-10-CM | POA: Diagnosis not present

## 2021-05-21 DIAGNOSIS — U071 COVID-19: Secondary | ICD-10-CM | POA: Diagnosis not present

## 2021-05-21 LAB — GLUCOSE, CAPILLARY
Glucose-Capillary: 109 mg/dL — ABNORMAL HIGH (ref 70–99)
Glucose-Capillary: 109 mg/dL — ABNORMAL HIGH (ref 70–99)
Glucose-Capillary: 101 mg/dL — ABNORMAL HIGH (ref 70–99)
Glucose-Capillary: 96 mg/dL (ref 70–99)
Glucose-Capillary: 109 mg/dL — ABNORMAL HIGH (ref 70–99)

## 2021-05-21 MED ORDER — PHENOL 1.4 % MT LIQD
1.0000 | OROMUCOSAL | Status: DC | PRN
  Filled 2021-05-21: qty 177

## 2021-05-21 NOTE — Progress Notes (Signed)
[facility] Progress Note  Patient: Tina [name]    [id number]  DOA: 05/13/2021    Date of Service: the patient was seen and examined on 05/21/2021  Brief hospital course: 80 year old female with past medical history for diabetes mellitus, HOCM, hypertension, hypothyroidism and dementia presented to the emergency room on 12/17 for increased and confusion x1 day with functional decline for the past few weeks.  Recent MRI noted late acute right frontal parasagittal infarct.  CT angiogram noted evolving subacute infarct, but no acute finding.  Patient underwent stroke work-up with hospitalist complicated by UTI.  PT recommended skilled nursing however clearance COVID test done on 12/23 positive and outpatient remains in hospital for 10-day quarantine.  Patient started on Paxlovid.  Assessment and Plan: Cardiovascular and Mediastinum Essential hypertension Assessment & Plan Continue losartan and Coreg.  Initially had some permissive hypertension as stable.  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  A1c of 9.2.  LDL of 111.  PT and OT recommending skilled nursing.  Echocardiogram noted preserved ejection fraction with negative bubble study.  On aspirin and Plavix plus statin.  Nursing noted on night of 12/24 decreased on left-sided sensation.  Notified on-call hospitalist and neurologist and CTA repeated.  No evidence of acute infarction.  Patient today states she does not have any numbness.  Endocrine Hypothyroidism Assessment & Plan Continue Synthroid  Mixed diabetic hyperlipidemia associated with type 2 diabetes mellitus ([place]) Assessment & Plan On statin.  Type 2 diabetes mellitus with hypoglycemia without coma, without long-term current use of insulin (HCC) Assessment & Plan A1c of 9.2.  Home medications on  glipizide and Actos.  Currently on sliding scale.  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  On Namenda.  Acute behavioral disturbances hopefully she will improve as stroke evolves and COVID/UTI treated  Acute metabolic encephalopathy Assessment & Plan Patient overall has had some progressive mental decline over the past 1 to 2 years.  Recently started on Namenda.  Mental status has been worsening during hospitalization likely from stroke plus COVID plus UTI and prolonged hospitalization.  Trying to avoid sedatives.  Genitourinary Acute lower UTI Assessment & Plan Positive urinalysis on 12/21.  Urine culture is unremarkable.  On IV Rocephin.  Other Frequent falls Assessment & Plan Due to progressive physical decline.  For skilled nursing.  COVID-19 virus infection Assessment & Plan Incidentally found on skilled nursing screening.  Now on 10-day quarantine.  Asymptomatic.  No respiratory issues.  On Paxlovid.  Following CRP    Body mass index is 24.6 kg/m.        Consultants: Neurology  Procedures: Echocardiogram  Antimicrobials: IV Rocephin 12/22-12/26  Code Status: Full code   Subjective: Patient responds to questions appropriately.  No acute distress, very sleepy  Objective: Vital signs were reviewed and unremarkable. Vitals:   05/21/21 0828 05/21/21 1145  BP: (!) 106/92 (!) 126/53  Pulse: 87 63  Resp: 18 17  Temp: 98 F (36.7 C) 97.9 F (36.6 C)  SpO2: 97% 97%    Intake/Output Summary (Last 24 hours) at 05/21/2021 1437 Last data filed at 05/21/2021 0917 Gross per 24 hour  Intake 1092.53 ml  Output 125 ml  Net 967.53 ml    Filed Weights   05/13/21 1941  Weight: 61 kg   Body mass index  is 24.6 kg/m.  Exam:  General: Alert and oriented x2, fatigued HEENT: Normocephalic and atraumatic, mucous memories slightly dry Cardiovascular: Regular rate and rhythm, S1-S2 Respiratory: Clear to auscultation  bilaterally Abdomen: Soft, nontender, nondistended, hypoactive bowel sounds Musculoskeletal: No clubbing or cyanosis, trace pitting edema Skin: No skin breaks, tears or lesions Psychiatry: Appropriate, no evidence of psychoses Neurology: Generalized weakness, no focal  Data Reviewed: My review of labs, imaging, notes and other tests shows no new significant findings.   Disposition:  Status is: Inpatient  Remains inpatient appropriate because: Needs skilled nursing  Family Communication: Daughter at bedside  DVT Prophylaxis: enoxaparin (LOVENOX) injection 40 mg Start: 05/13/21 2145    Author: Hollice Espy ,MD 05/21/2021 2:37 PM  To reach On-call, see care teams to locate the attending and reach out via [email or web address]. Between 7PM-7AM, please contact night-coverage If you still have difficulty reaching the attending provider, please page the [facility] (Director on Call) for [facility] on amion for assistance.

## 2021-05-22 DIAGNOSIS — G9341 Metabolic encephalopathy: Secondary | ICD-10-CM | POA: Diagnosis not present

## 2021-05-22 DIAGNOSIS — U071 COVID-19: Secondary | ICD-10-CM | POA: Diagnosis not present

## 2021-05-22 DIAGNOSIS — I5032 Chronic diastolic (congestive) heart failure: Secondary | ICD-10-CM | POA: Diagnosis present

## 2021-05-22 DIAGNOSIS — R4182 Altered mental status, unspecified: Secondary | ICD-10-CM | POA: Diagnosis not present

## 2021-05-22 DIAGNOSIS — N390 Urinary tract infection, site not specified: Secondary | ICD-10-CM | POA: Diagnosis not present

## 2021-05-22 LAB — CBC
HCT: 38.7 % (ref 36.0–46.0)
Hemoglobin: 12.7 g/dL (ref 12.0–15.0)
MCH: 25.5 pg — ABNORMAL LOW (ref 26.0–34.0)
MCHC: 32.8 g/dL (ref 30.0–36.0)
MCV: 77.6 fL — ABNORMAL LOW (ref 80.0–100.0)
Platelets: 249 10*3/uL (ref 150–400)
RBC: 4.99 MIL/uL (ref 3.87–5.11)
RDW: 15.4 % (ref 11.5–15.5)
WBC: 4 10*3/uL (ref 4.0–10.5)
nRBC: 0 % (ref 0.0–0.2)

## 2021-05-22 LAB — BRAIN NATRIURETIC PEPTIDE: B Natriuretic Peptide: 79.1 pg/mL (ref 0.0–100.0)

## 2021-05-22 LAB — BASIC METABOLIC PANEL
Anion gap: 13 (ref 5–15)
BUN: 11 mg/dL (ref 8–23)
CO2: 24 mmol/L (ref 22–32)
Calcium: 8.2 mg/dL — ABNORMAL LOW (ref 8.9–10.3)
Chloride: 98 mmol/L (ref 98–111)
Creatinine, Ser: 0.64 mg/dL (ref 0.44–1.00)
GFR, Estimated: >60 mL/min (ref >60)
Glucose, Bld: 132 mg/dL — ABNORMAL HIGH (ref 70–99)
Potassium: 3.4 mmol/L — ABNORMAL LOW (ref 3.5–5.1)
Sodium: 135 mmol/L (ref 135–145)

## 2021-05-22 LAB — GLUCOSE, CAPILLARY
Glucose-Capillary: 123 mg/dL — ABNORMAL HIGH (ref 70–99)
Glucose-Capillary: 139 mg/dL — ABNORMAL HIGH (ref 70–99)
Glucose-Capillary: 233 mg/dL — ABNORMAL HIGH (ref 70–99)
Glucose-Capillary: 105 mg/dL — ABNORMAL HIGH (ref 70–99)

## 2021-05-22 MED ORDER — FUROSEMIDE 10 MG/ML IJ SOLN
20.0000 mg | Freq: Two times a day (BID) | INTRAMUSCULAR | Status: DC
  Administered 2021-05-22 – 2021-05-23 (×3): 20 mg via INTRAVENOUS
  Filled 2021-05-22 (×3): qty 4

## 2021-05-22 MED ORDER — HYDRALAZINE HCL 20 MG/ML IJ SOLN
5.0000 mg | INTRAMUSCULAR | Status: DC | PRN
  Filled 2021-05-22: qty 1

## 2021-05-22 MED ORDER — POTASSIUM CHLORIDE CRYS ER 20 MEQ PO TBCR
20.0000 meq | EXTENDED_RELEASE_TABLET | Freq: Once | ORAL | Status: AC
  Administered 2021-05-22: 10:00:00 20 meq via ORAL
  Filled 2021-05-22: qty 1

## 2021-05-22 MED ORDER — HALOPERIDOL LACTATE 5 MG/ML IJ SOLN
1.0000 mg | Freq: Four times a day (QID) | INTRAMUSCULAR | Status: DC | PRN
  Administered 2021-05-22: 22:00:00 1 mg via INTRAMUSCULAR

## 2021-05-22 MED ORDER — TRAMADOL HCL 50 MG PO TABS
50.0000 mg | ORAL_TABLET | Freq: Four times a day (QID) | ORAL | Status: DC | PRN
  Administered 2021-05-22 – 2021-05-29 (×8): 50 mg via ORAL
  Filled 2021-05-22 (×9): qty 1

## 2021-05-22 MED ORDER — HYDRALAZINE HCL 25 MG PO TABS: 25.0000 mg | ORAL_TABLET | Freq: Four times a day (QID) | ORAL | Status: DC | PRN

## 2021-05-22 NOTE — Assessment & Plan Note (Addendum)
Noted elevated blood pressure.  However BNP within normal limits.  Received a few doses of IV Lasix.  Has diuresed over 3 L in the last 2 days Lasix stopped when she had episodes of hypotension.  BNP on 12/29 at 88.

## 2021-05-22 NOTE — Plan of Care (Signed)
?  Problem: Education: ?Goal: Knowledge of disease or condition will improve ?Outcome: Progressing ?Goal: Knowledge of secondary prevention will improve (SELECT ALL) ?Outcome: Progressing ?Goal: Knowledge of patient specific risk factors will improve (INDIVIDUALIZE FOR PATIENT) ?Outcome: Progressing ?  ?

## 2021-05-22 NOTE — Plan of Care (Signed)
  Problem: Clinical Measurements: Goal: Diagnostic test results will improve Outcome: Progressing   

## 2021-05-22 NOTE — Progress Notes (Signed)
[facility] Progress Note  Patient: Tina [name]    [id number]  DOA: 05/13/2021    Date of Service: the patient was seen and examined on 05/22/2021  Brief hospital course: 81 year old female with past medical history for diabetes mellitus, HOCM, hypertension, hypothyroidism and dementia presented to the emergency room on 12/17 for increased and confusion x1 day with functional decline for the past few weeks.  Recent MRI noted late acute right frontal parasagittal infarct.  CT angiogram noted evolving subacute infarct, but no acute finding.  Patient underwent stroke work-up with hospitalist complicated by UTI.  PT recommended skilled nursing however clearance COVID test done on 12/23 positive and outpatient remains in hospital for 10-day quarantine.  Patient started on Paxlovid.  Assessment and Plan: Cardiovascular and Mediastinum Essential hypertension Assessment & Plan Continue losartan and Coreg.  Initially had some permissive hypertension as stable.  Chronic diastolic CHF (congestive heart failure) ([place]) Assessment & Plan Noted elevated blood pressure.  However BNP within normal limits.  We will try some gentle IV Lasix.  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  A1c of 9.2.  LDL of 111.  PT and OT recommending skilled nursing.  Echocardiogram noted preserved ejection fraction with negative bubble study.  On aspirin and Plavix plus statin.  Nursing noted on night of 12/24 decreased on left-sided sensation.  Notified on-call hospitalist and neurologist and CTA repeated.  No evidence of acute infarction.  Patient today states she does not have any numbness.  Endocrine Hypothyroidism Assessment & Plan Continue Synthroid  Mixed diabetic hyperlipidemia associated with type 2 diabetes mellitus ([place]) Assessment &  Plan On statin.  Type 2 diabetes mellitus with hypoglycemia without coma, without long-term current use of insulin (HCC) Assessment & Plan A1c of 9.2.  Home medications on glipizide and Actos.  Currently on sliding scale.  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  On Namenda.  Acute behavioral disturbances hopefully she will improve as stroke evolves and COVID/UTI treated  Acute metabolic encephalopathy Assessment & Plan Patient overall has had some progressive mental decline over the past 1 to 2 years.  Recently started on Namenda.  Mental status has been worsening during hospitalization likely from stroke plus COVID plus UTI and prolonged hospitalization, although today she does answer questions appropriately.  Trying to avoid sedatives.  Genitourinary Acute lower UTI Assessment & Plan Positive urinalysis on 12/21.  Urine culture is unremarkable.  Completed course of IV Rocephin  Other Frequent falls Assessment & Plan Due to progressive physical decline.  For skilled nursing.  COVID-19 virus infection Assessment & Plan Incidentally found on skilled nursing screening.  Now on 10-day quarantine.  Asymptomatic.  No respiratory issues.  On Paxlovid.  Following CRP    Body mass index is 24.6 kg/m.        Consultants: Neurology  Procedures: Echocardiogram  Antimicrobials: IV Rocephin 12/22-12/26  Code Status: Full code   Subjective: Patient responds to questions appropriately.  Complains of a mild headache, bilateral leg cramping  Objective: Vital signs were reviewed and unremarkable. Vitals:   05/22/21 0636 05/22/21 0813  BP: (!) 175/63 (!) 172/84  Pulse: 65 79  Resp: 18   Temp: 98 F (36.7 C) 98.7 F (37.1 C)  SpO2: 98% 97%    Intake/Output Summary (Last 24 hours) at 05/22/2021  1355 Last data filed at 05/22/2021 0630 Gross per 24 hour  Intake --  Output 1600 ml  Net -1600 ml    Filed Weights   05/13/21  1941  Weight: 61 kg   Body mass index is 24.6 kg/m.  Exam:  General: Alert and oriented x2, fatigued HEENT: Normocephalic and atraumatic, mucous memories slightly dry Cardiovascular: Regular rate and rhythm, S1-S2 Respiratory: Clear to auscultation bilaterally Abdomen: Soft, nontender, nondistended, hypoactive bowel sounds Musculoskeletal: No clubbing or cyanosis, trace pitting edema Skin: No skin breaks, tears or lesions Psychiatry: Appropriate, no evidence of psychoses Neurology: Generalized weakness, no focal  Data Reviewed: My review of labs, imaging, notes and other tests shows no new significant findings.   Disposition:  Status is: Inpatient  Remains inpatient appropriate because: Needs skilled nursing  Family Communication: Daughter and son-in-law at bedside  DVT Prophylaxis: enoxaparin (LOVENOX) injection 40 mg Start: 05/13/21 2145    Author: Hollice Espy ,MD 05/22/2021 1:55 PM  To reach On-call, see care teams to locate the attending and reach out via [email or web address]. Between 7PM-7AM, please contact night-coverage If you still have difficulty reaching the attending provider, please page the [facility] (Director on Call) for [facility] on amion for assistance.

## 2021-05-23 DIAGNOSIS — R4182 Altered mental status, unspecified: Secondary | ICD-10-CM | POA: Diagnosis not present

## 2021-05-23 DIAGNOSIS — U071 COVID-19: Secondary | ICD-10-CM | POA: Diagnosis not present

## 2021-05-23 DIAGNOSIS — N390 Urinary tract infection, site not specified: Secondary | ICD-10-CM | POA: Diagnosis not present

## 2021-05-23 DIAGNOSIS — G9341 Metabolic encephalopathy: Secondary | ICD-10-CM | POA: Diagnosis not present

## 2021-05-23 LAB — GLUCOSE, CAPILLARY
Glucose-Capillary: 141 mg/dL — ABNORMAL HIGH (ref 70–99)
Glucose-Capillary: 148 mg/dL — ABNORMAL HIGH (ref 70–99)
Glucose-Capillary: 163 mg/dL — ABNORMAL HIGH (ref 70–99)
Glucose-Capillary: 177 mg/dL — ABNORMAL HIGH (ref 70–99)
Glucose-Capillary: 179 mg/dL — ABNORMAL HIGH (ref 70–99)

## 2021-05-23 LAB — BASIC METABOLIC PANEL
Anion gap: 15 (ref 5–15)
BUN: 11 mg/dL (ref 8–23)
CO2: 23 mmol/L (ref 22–32)
Calcium: 9 mg/dL (ref 8.9–10.3)
Chloride: 99 mmol/L (ref 98–111)
Creatinine, Ser: 0.88 mg/dL (ref 0.44–1.00)
GFR, Estimated: >60 mL/min (ref >60)
Glucose, Bld: 148 mg/dL — ABNORMAL HIGH (ref 70–99)
Potassium: 3.4 mmol/L — ABNORMAL LOW (ref 3.5–5.1)
Sodium: 137 mmol/L (ref 135–145)

## 2021-05-23 LAB — C-REACTIVE PROTEIN: CRP: 1 mg/dL — ABNORMAL HIGH (ref <1.0)

## 2021-05-23 MED ORDER — POTASSIUM CHLORIDE CRYS ER 20 MEQ PO TBCR
20.0000 meq | EXTENDED_RELEASE_TABLET | Freq: Every day | ORAL | Status: AC
  Administered 2021-05-23 – 2021-05-25 (×3): 20 meq via ORAL
  Filled 2021-05-23 (×3): qty 1

## 2021-05-23 MED ORDER — POTASSIUM CHLORIDE CRYS ER 20 MEQ PO TBCR: 20.0000 meq | EXTENDED_RELEASE_TABLET | Freq: Every day | ORAL | Status: DC

## 2021-05-23 MED ORDER — TAMSULOSIN HCL 0.4 MG PO CAPS
0.4000 mg | ORAL_CAPSULE | Freq: Every day | ORAL | Status: DC
  Administered 2021-05-23 – 2021-05-24 (×2): 0.4 mg via ORAL
  Filled 2021-05-23 (×2): qty 1

## 2021-05-23 NOTE — Progress Notes (Signed)
Physical Therapy Treatment Patient Details Name: Tina [name] MRN: [id number] DOB: [birth year] Today's Date: 05/23/2021   History of Present Illness CHAUNTAY PASZKIEWICZ is a 80 y.o. female who presented with increased confusion and lethargy x 1 day along with decline in function over the last couple weeks. 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 CT head 12/20 showing stable size of known subacute white matter infarct. PMH significant for DM2, HOCM, HTN, hypothyroidism, memory changes.    PT Comments    Pt received in supine, agreeable to therapy session and daughter present and encouraging. Pt remains somewhat lethargic and needing heavy +2 maxA for bed mobility and max to totalA for transfers to chair with +2 assist. Pt with poor apparent motor planning on Lt side, with delay in following instructions for letting go of or holding on to objects and for moving L foot to take steps. She also demonstrates continued Lt lean with seated/standing tasks. Pt continues to benefit from PT services to progress toward functional mobility goals.    Recommendations for follow up therapy are one component of a multi-disciplinary discharge planning process, led by the attending physician.  Recommendations may be updated based on patient status, additional functional criteria and insurance authorization.  Follow Up Recommendations  Skilled nursing-short term rehab (<3 hours/day)     Assistance Recommended at Discharge Frequent or constant Supervision/Assistance  Equipment Recommendations  None recommended by PT    Recommendations for Other Services       Precautions / Restrictions Precautions Precautions: Fall Precaution Comments: Airborne/Contact Restrictions Weight Bearing Restrictions: No     Mobility  Bed Mobility Overal bed mobility: Needs Assistance Bed Mobility: Rolling;Sidelying to Sit Rolling: Max assist;+2 for safety/equipment Sidelying to sit: Max assist;+2 for  safety/equipment       General bed mobility comments: multimodal cues given, pt making little effort to participate in moving LLE    Transfers Overall transfer level: Needs assistance Equipment used: Rolling walker (2 wheels) Transfers: Sit to/from Stand;Bed to chair/wheelchair/BSC Sit to Stand: Max assist;+2 physical assistance;+2 safety/equipment Stand pivot transfers: Max assist;+2 physical assistance;+2 safety/equipment     General transfer comment: stood initally wtih RW, pt had heavy L lateral lean. Max A +2 face to face transfer for stand pivot from bed>chair and manual assist for weight shifting and foot placement.     Modified Rankin (Stroke Patients Only) Modified Rankin (Stroke Patients Only) Pre-Morbid Rankin Score: No significant disability Modified Rankin: Severe disability     Balance Overall balance assessment: Needs assistance;History of Falls Sitting-balance support: Feet supported Sitting balance-Leahy Scale: Poor Sitting balance - Comments: L lateral lean   Standing balance support: Bilateral upper extremity supported Standing balance-Leahy Scale: Zero Standing balance comment: totalA +2 to stand at RW, maxA +2 to stand with BUE HHA and gait belt support        Cognition Arousal/Alertness: Lethargic Behavior During Therapy: Flat affect Overall Cognitive Status: History of cognitive impairments - at baseline     General Comments: Pt maintained eyes open this session, follows 1 step commands with increased time and multimodal cues. Pt with slow processing for cues for L hand placement/sequencing and step sequencing on LLE.        Exercises Other Exercises Other Exercises: static sitting with focus on midline posture and cervical extension Other Exercises: LLE AAROM LAQ, hip flexion x5 reps ea Other Exercises: LUE reaching cross body and anterior for improved postural awareness and balance training    General Comments General comments (skin  integrity, edema, etc.): VSS on RA, daughter present and supportive.      Pertinent Vitals/Pain Pain Assessment: Faces Faces Pain Scale: Hurts a little bit Pain Location: pt unable to localize, grimacing at times Pain Descriptors / Indicators: Grimacing Pain Intervention(s): Monitored during session;Repositioned           PT Goals (current goals can now be found in the care plan section) Acute Rehab PT Goals Patient Stated Goal: pt daughter would like her to go to rehab PT Goal Formulation: With patient Time For Goal Achievement: 05/30/21 Progress towards PT goals: Progressing toward goals    Frequency    Min 3X/week      PT Plan Current plan remains appropriate    Co-evaluation PT/OT/SLP Co-Evaluation/Treatment: Yes Reason for Co-Treatment: Complexity of the patient's impairments (multi-system involvement);Necessary to address cognition/behavior during functional activity;For patient/therapist safety;To address functional/ADL transfers PT goals addressed during session: Mobility/safety with mobility;Balance;Proper use of DME;Strengthening/ROM OT goals addressed during session: ADL's and self-care      AM-PAC PT "6 Clicks" Mobility   Outcome Measure  Help needed turning from your back to your side while in a flat bed without using bedrails?: Total Help needed moving from lying on your back to sitting on the side of a flat bed without using bedrails?: Total Help needed moving to and from a bed to a chair (including a wheelchair)?: Total Help needed standing up from a chair using your arms (e.g., wheelchair or bedside chair)?: Total Help needed to walk in hospital room?: Total Help needed climbing 3-5 steps with a railing? : Total 6 Click Score: 6    End of Session Equipment Utilized During Treatment: Gait belt Activity Tolerance: Patient limited by lethargy;Patient limited by fatigue Patient left: in chair;with call bell/phone within reach;with chair alarm set;with  family/visitor present Nurse Communication: Mobility status;Need for lift equipment (may do better with +2 and Stedy) PT Visit Diagnosis: History of falling (Z91.81);Unsteadiness on feet (R26.81)     Time: [id number] PT Time Calculation (min) (ACUTE ONLY): 24 min  Charges:  $Therapeutic Activity: 8-22 mins                     Regino Fournet P., PTA Acute Rehabilitation Services Pager: [phone] Office: [place] 05/23/2021, 1:41 PM

## 2021-05-23 NOTE — Progress Notes (Signed)
Pt had Foley d/c'd 12/26 @1700 . Pt has been unable to void all night despite trying, bladder scan=351. Received order per MD verbal to replace foley.

## 2021-05-23 NOTE — Progress Notes (Signed)
[facility] Progress Note  Patient: Tina [name]    DEY:[id number]  DOA: 05/13/2021    Date of Service: the patient was seen and examined on 05/23/2021  Brief hospital course: 80 year old female with past medical history for diabetes mellitus, HOCM, hypertension, hypothyroidism and dementia presented to the emergency room on 12/17 for increased and confusion x1 day with functional decline for the past few weeks.  Recent MRI noted late acute right frontal parasagittal infarct.  CT angiogram noted evolving subacute infarct, but no acute finding.  Patient underwent stroke work-up with hospitalist complicated by UTI.  PT recommended skilled nursing however clearance COVID test done on 12/23 positive and outpatient remains in hospital for 10-day quarantine.  Patient started on Paxlovid.  Patient otherwise doing okay.  Does have sundowning.  Assessment and Plan: Cardiovascular and Mediastinum Essential hypertension Assessment & Plan Continue losartan and Coreg.  Initially had some permissive hypertension as stable.  Chronic diastolic CHF (congestive heart failure) (HCC) Assessment & Plan Noted elevated blood pressure.  However BNP within normal limits.  Trying some gentle Lasix.  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  A1c of 9.2.  LDL of 111.  PT and OT recommending skilled nursing.  Echocardiogram noted preserved ejection fraction with negative bubble study.  On aspirin and Plavix plus statin.  Nursing noted on night of 12/24 decreased on left-sided sensation.  Notified on-call hospitalist and neurologist and CTA repeated.  No evidence of acute infarction.    Endocrine Hypothyroidism Assessment & Plan Continue Synthroid  Mixed diabetic hyperlipidemia associated with type 2  diabetes mellitus (HCC) Assessment & Plan On statin.  Type 2 diabetes mellitus with hypoglycemia without coma, without long-term current use of insulin (HCC) Assessment & Plan A1c of 9.2.  Home medications on glipizide and Actos.  Currently on sliding scale.  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  On Namenda.  Acute behavioral disturbances hopefully she will improve as stroke evolves and COVID/UTI treated  Acute metabolic encephalopathy Assessment & Plan Patient overall has had some progressive mental decline over the past 1 to 2 years.  Recently started on Namenda.  Mental status a little worse from baseline during hospitalization likely from stroke plus COVID plus UTI and prolonged hospitalization, especially at night.  More appropriate during the day.  Genitourinary Acute lower UTI Assessment & Plan Positive urinalysis on 12/21.  Urine culture is unremarkable.  Completed course of IV Rocephin  Other Frequent falls Assessment & Plan Due to progressive physical decline.  For skilled nursing.  COVID-19 virus infection Assessment & Plan Incidentally found on skilled nursing screening.  Now on 10-day quarantine.  Asymptomatic.  No respiratory issues.  On Paxlovid.  CRP at 1.    Body mass index is 24.6 kg/m.        Consultants: Neurology  Procedures: Echocardiogram  Antimicrobials: IV Rocephin 12/22-12/26  Code Status: Full code   Subjective: Patient responds to questions appropriately.  Mild headache.  Objective: Vital signs were reviewed and unremarkable. Vitals:   05/23/21 0831 05/23/21 1141  BP: (!) 154/78 118/71  Pulse: 95 91  Resp: 14 14  Temp: 98.9 F (37.2 C) 98.9 F (37.2 C)  SpO2: 95% 98%    Intake/Output Summary (Last 24 hours)  at 05/23/2021 1535 Last data filed at 05/23/2021 1023 Gross per 24 hour  Intake --  Output 1150 ml  Net -1150 ml    Filed Weights   05/13/21 1941  Weight: 61 kg    Body mass index is 24.6 kg/m.  Exam:  General: Alert and oriented x2, fatigued HEENT: Normocephalic and atraumatic, mucous memories slightly dry Cardiovascular: Regular rate and rhythm, S1-S2 Respiratory: Clear to auscultation bilaterally Abdomen: Soft, nontender, nondistended, hypoactive bowel sounds Musculoskeletal: No clubbing or cyanosis, trace pitting edema Skin: No skin breaks, tears or lesions Psychiatry: Appropriate, no evidence of psychoses Neurology: Generalized weakness, no focal  Data Reviewed: My review of labs, imaging, notes and other tests shows no new significant findings.   Disposition:  Status is: Inpatient  Remains inpatient appropriate because: Needs skilled nursing  Family Communication: Daughter at bedside.  DVT Prophylaxis: enoxaparin (LOVENOX) injection 40 mg Start: 05/13/21 2145    Author: Annita Brod ,MD 05/23/2021 3:35 PM  To reach On-call, see care teams to locate the attending and reach out via [email or web address]. Between 7PM-7AM, please contact night-coverage If you still have difficulty reaching the attending provider, please page the [facility] (Director on Call) for [facility] on amion for assistance.

## 2021-05-23 NOTE — Plan of Care (Signed)
Pt has had multiple visitors. Pt has stood at the side of the bed with 2 assist. Pt has been in the chair. Pt c/o a HA earlier in the day and tylenol helped. Foley is in placed and cleaned per protocol to prevent infection.    Problem: Education: Goal: Knowledge of General Education information will improve Description: Including pain rating scale, medication(s)/side effects and non-pharmacologic comfort measures Outcome: Progressing   Problem: Health Behavior/Discharge Planning: Goal: Ability to manage health-related needs will improve Outcome: Progressing   Problem: Clinical Measurements: Goal: Diagnostic test results will improve Outcome: Progressing   Problem: Activity: Goal: Risk for activity intolerance will decrease Outcome: Progressing   Problem: Nutrition: Goal: Adequate nutrition will be maintained Outcome: Progressing   Problem: Elimination: Goal: Will not experience complications related to bowel motility Outcome: Progressing Goal: Will not experience complications related to urinary retention Outcome: Progressing   Problem: Safety: Goal: Ability to remain free from injury will improve Outcome: Progressing   Problem: Skin Integrity: Goal: Risk for impaired skin integrity will decrease Outcome: Progressing   Problem: Education: Goal: Knowledge of disease or condition will improve Outcome: Progressing Goal: Knowledge of secondary prevention will improve (SELECT ALL) Outcome: Progressing Goal: Knowledge of patient specific risk factors will improve (INDIVIDUALIZE FOR PATIENT) Outcome: Progressing

## 2021-05-23 NOTE — Progress Notes (Signed)
Occupational Therapy Treatment Patient Details Name: Tina [name] MRN: [id number] DOB: 1940/09/12 Today's Date: 05/23/2021   History of present illness Tina [name] is a 80 y.o. female who presented with increased confusion and lethargy x 1 day along with decline in function over the last couple weeks. 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 CT head 12/20 showing stable size of known subacute white matter infarct. PMH significant for DM2, HOCM, HTN, hypothyroidism, memory changes.   OT comments  Pt is progressing incrementally, session completed in conjunction with PT to progress safe transfer OOB. Pt required multimodial cues and significantly increased time for all command following and to participate functionally during mobility. Overall pt required max A +2 for bed mobility, mod a for sitting balance (pt with L lateral lean) and max A +2 for sit<>stand 2x and stand pivot from bed>chair. She tolerated all tasks and remained alert throughout. D/c remains appropriate. OT to continue to follow acutely.   Recommendations for follow up therapy are one component of a multi-disciplinary discharge planning process, led by the attending physician.  Recommendations may be updated based on patient status, additional functional criteria and insurance authorization.    Follow Up Recommendations  Skilled nursing-short term rehab (<3 hours/day)    Assistance Recommended at Discharge Frequent or constant Supervision/Assistance  Equipment Recommendations  Other (comment)       Precautions / Restrictions Precautions Precautions: Fall Precaution Comments: Airborne/Contact Restrictions Weight Bearing Restrictions: No       Mobility Bed Mobility Overal bed mobility: Needs Assistance Bed Mobility: Rolling;Sidelying to Sit Rolling: Max assist;+2 for safety/equipment Sidelying to sit: Max assist;+2 for safety/equipment       General bed mobility comments: multimodal cues given, pt  making little effort to participate in moving LLE    Transfers Overall transfer level: Needs assistance Equipment used: Rolling walker (2 wheels) Transfers: Sit to/from Stand;Bed to chair/wheelchair/BSC Sit to Stand: Max assist;+2 physical assistance;+2 safety/equipment Stand pivot transfers: Max assist;+2 physical assistance;+2 safety/equipment         General transfer comment: stood initally wtih RW, pt had heavy L lateral lean. Max A +2 face to face transfer for stand pivot from bed>chair     Balance Overall balance assessment: Needs assistance;History of Falls Sitting-balance support: Feet supported Sitting balance-Leahy Scale: Poor Sitting balance - Comments: L lateral lean   Standing balance support: Bilateral upper extremity supported Standing balance-Leahy Scale: Poor                             ADL either performed or assessed with clinical judgement   ADL Overall ADL's : Needs assistance/impaired Eating/Feeding: Moderate assistance;Bed level Eating/Feeding Details (indicate cue type and reason): pts daughter feeding pt upon arrival                     Toilet Transfer: Maximal assistance;+2 for physical assistance;+2 for safety/equipment;Stand-pivot           Functional mobility during ADLs: Maximal assistance;Rolling walker (2 wheels) General ADL Comments: session focused on transferring OOB and command following. Pt more alert this session but continues to required multimodial cues to participate in all tasks. pt with fear of falling and resisting movements    Extremity/Trunk Assessment Upper Extremity Assessment RUE Deficits / Details: strength is generally 4/5 RUE Coordination: decreased fine motor LUE Deficits / Details: strength is geneally 3+/5 LUE Coordination: decreased fine motor   Lower Extremity Assessment Lower Extremity  Assessment: Defer to PT evaluation        Vision   Vision Assessment?: No apparent visual deficits           Cognition Arousal/Alertness: Lethargic Behavior During Therapy: Flat affect Overall Cognitive Status: History of cognitive impairments - at baseline           General Comments: Pt maintained eyes open this session, follows 1 step commands with increased time and multimodial cues                General Comments VSS on RA, daughter present    Pertinent Vitals/ Pain       Pain Assessment: Faces Faces Pain Scale: Hurts a little bit Pain Location: pt unable to localize, grimacing at times Pain Descriptors / Indicators: Grimacing Pain Intervention(s): Monitored during session   Frequency  Min 2X/week        Progress Toward Goals  OT Goals(current goals can now be found in the care plan section)  Progress towards OT goals: Progressing toward goals  Acute Rehab OT Goals OT Goal Formulation: With patient Time For Goal Achievement: 05/28/21 Potential to Achieve Goals: Good ADL Goals Pt Will Perform Grooming: with modified independence;standing Pt Will Perform Lower Body Bathing: with modified independence;sit to/from stand Pt Will Perform Lower Body Dressing: with modified independence;sit to/from stand Pt Will Transfer to Toilet: with modified independence;ambulating Additional ADL Goal #1: Pt will indep verbalize at least 3 fall prevention strategies to apply in the home setting  Plan Discharge plan remains appropriate    Co-evaluation    PT/OT/SLP Co-Evaluation/Treatment: Yes Reason for Co-Treatment: Complexity of the patient's impairments (multi-system involvement);For patient/therapist safety;To address functional/ADL transfers   OT goals addressed during session: ADL's and self-care      AM-PAC OT "6 Clicks" Daily Activity     Outcome Measure   Help from another person eating meals?: A Little Help from another person taking care of personal grooming?: A Lot Help from another person toileting, which includes using toliet, bedpan, or urinal?: A  Lot Help from another person bathing (including washing, rinsing, drying)?: Total Help from another person to put on and taking off regular upper body clothing?: A Little Help from another person to put on and taking off regular lower body clothing?: A Lot 6 Click Score: 13    End of Session Equipment Utilized During Treatment: Gait belt;Rolling walker (2 wheels)  OT Visit Diagnosis: Unsteadiness on feet (R26.81);Other abnormalities of gait and mobility (R26.89);Repeated falls (R29.6);Muscle weakness (generalized) (M62.81);History of falling (Z91.81);Pain;Hemiplegia and hemiparesis Hemiplegia - Right/Left: Left Hemiplegia - dominant/non-dominant: Non-Dominant Hemiplegia - caused by: Cerebral infarction   Activity Tolerance Patient tolerated treatment well   Patient Left in chair;with call bell/phone within reach;with chair alarm set   Nurse Communication Mobility status        Time: 2620-3559 OT Time Calculation (min): 24 min  Charges: OT General Charges $OT Visit: 1 Visit OT Treatments $Self Care/Home Management : 8-22 mins   Cordell Coke A Zachariah Pavek 05/23/2021, 1:30 PM

## 2021-05-24 ENCOUNTER — Inpatient Hospital Stay (HOSPITAL_COMMUNITY): Payer: Medicare Other

## 2021-05-24 ENCOUNTER — Telehealth: Payer: Self-pay | Admitting: Psychiatry

## 2021-05-24 DIAGNOSIS — U071 COVID-19: Secondary | ICD-10-CM | POA: Diagnosis not present

## 2021-05-24 DIAGNOSIS — F03918 Unspecified dementia, unspecified severity, with other behavioral disturbance: Secondary | ICD-10-CM | POA: Diagnosis not present

## 2021-05-24 DIAGNOSIS — I63521 Cerebral infarction due to unspecified occlusion or stenosis of right anterior cerebral artery: Secondary | ICD-10-CM | POA: Diagnosis not present

## 2021-05-24 DIAGNOSIS — R4182 Altered mental status, unspecified: Secondary | ICD-10-CM | POA: Diagnosis not present

## 2021-05-24 DIAGNOSIS — N390 Urinary tract infection, site not specified: Secondary | ICD-10-CM | POA: Diagnosis not present

## 2021-05-24 DIAGNOSIS — G9341 Metabolic encephalopathy: Secondary | ICD-10-CM | POA: Diagnosis not present

## 2021-05-24 LAB — CBC
HCT: 36.8 % (ref 36.0–46.0)
Hemoglobin: 12.2 g/dL (ref 12.0–15.0)
MCH: 25.7 pg — ABNORMAL LOW (ref 26.0–34.0)
MCHC: 33.2 g/dL (ref 30.0–36.0)
MCV: 77.5 fL — ABNORMAL LOW (ref 80.0–100.0)
Platelets: 264 10*3/uL (ref 150–400)
RBC: 4.75 MIL/uL (ref 3.87–5.11)
RDW: 15.6 % — ABNORMAL HIGH (ref 11.5–15.5)
WBC: 3.5 10*3/uL — ABNORMAL LOW (ref 4.0–10.5)
nRBC: 0 % (ref 0.0–0.2)

## 2021-05-24 LAB — GLUCOSE, CAPILLARY
Glucose-Capillary: 177 mg/dL — ABNORMAL HIGH (ref 70–99)
Glucose-Capillary: 164 mg/dL — ABNORMAL HIGH (ref 70–99)
Glucose-Capillary: 160 mg/dL — ABNORMAL HIGH (ref 70–99)
Glucose-Capillary: 125 mg/dL — ABNORMAL HIGH (ref 70–99)

## 2021-05-24 LAB — TROPONIN I (HIGH SENSITIVITY): Troponin I (High Sensitivity): 23 ng/L — ABNORMAL HIGH (ref <18)

## 2021-05-24 LAB — BASIC METABOLIC PANEL
Anion gap: 14 (ref 5–15)
BUN: 34 mg/dL — ABNORMAL HIGH (ref 8–23)
CO2: 24 mmol/L (ref 22–32)
Calcium: 8.7 mg/dL — ABNORMAL LOW (ref 8.9–10.3)
Chloride: 98 mmol/L (ref 98–111)
Creatinine, Ser: 1.48 mg/dL — ABNORMAL HIGH (ref 0.44–1.00)
GFR, Estimated: 35 mL/min — ABNORMAL LOW (ref >60)
Glucose, Bld: 168 mg/dL — ABNORMAL HIGH (ref 70–99)
Potassium: 3.8 mmol/L (ref 3.5–5.1)
Sodium: 136 mmol/L (ref 135–145)

## 2021-05-24 MED ORDER — SODIUM CHLORIDE 0.9 % IV BOLUS
1000.0000 mL | Freq: Once | INTRAVENOUS | Status: AC
  Administered 2021-05-24: 18:00:00 1000 mL via INTRAVENOUS

## 2021-05-24 MED ORDER — ASPIRIN EC 325 MG PO TBEC
325.0000 mg | DELAYED_RELEASE_TABLET | Freq: Every day | ORAL | Status: DC
  Administered 2021-05-25 – 2021-05-31 (×7): 325 mg via ORAL
  Filled 2021-05-24 (×7): qty 1

## 2021-05-24 MED ORDER — SODIUM CHLORIDE 0.9 % IV BOLUS
500.0000 mL | Freq: Once | INTRAVENOUS | Status: AC
  Administered 2021-05-24: 15:00:00 500 mL via INTRAVENOUS

## 2021-05-24 MED ORDER — SODIUM CHLORIDE 0.9 % IV BOLUS
500.0000 mL | Freq: Once | INTRAVENOUS | Status: AC
  Administered 2021-05-24: 21:00:00 500 mL via INTRAVENOUS

## 2021-05-24 MED ORDER — LIP MEDEX EX OINT
TOPICAL_OINTMENT | Freq: Two times a day (BID) | CUTANEOUS | Status: DC
  Administered 2021-05-25 – 2021-05-30 (×7): 75 via TOPICAL
  Filled 2021-05-24: qty 7

## 2021-05-24 MED ORDER — SODIUM CHLORIDE 0.9 % IV SOLN: INTRAVENOUS | Status: DC

## 2021-05-24 MED ORDER — ENOXAPARIN SODIUM 30 MG/0.3ML IJ SOSY
30.0000 mg | PREFILLED_SYRINGE | Freq: Every day | INTRAMUSCULAR | Status: DC
  Administered 2021-05-24: 21:00:00 30 mg via SUBCUTANEOUS
  Filled 2021-05-24: qty 0.3

## 2021-05-24 NOTE — Progress Notes (Addendum)
STROKE TEAM PROGRESS NOTE   ATTENDING NOTE: I reviewed above note and agree with the assessment and plan. Pt was seen and examined.   80 year old female with history of diabetes, hypertension, dementia admitted for confusion lethargy generalized weakness and functional decline on 12/14.  MRI showed right infarct.  ACA infarct.  CTA head and neck showed right A2 occlusion, right P2 occlusion, left P1/P2 severe stenosis, right M2 moderate stenosis, right ICA significant calcified plaque with 40 to 50% stenosis.  EF 70 to 75%.  LDL 111, A1c 9.2.  Loop recorder placed.  On DAPT and Lipitor 40.  Plan to discharge to SNF but found to have COVID-positive.  On 12/25 patient was found to have increased lethargy and left sided weakness.  CT head showed increased size of right frontal infarct.  MRI repeat showed extension of the right ACA infarct, and a new right parietal white matter small infarct.  CTA head and neck unchanged from above.  On exam, patient daughter at bedside, patient awake alert, not in acute distress.  Daughter stated that patient did not get good sleep last night and this morning was drowsy sleepy, repeat CT stable.  Currently, patient orientated to self and people, but not orientated to age or time.  Moderate dysarthria, however, able to name and repeat follow some commands, paucity of speech.  Blinking to visual threat bilaterally, no gaze palsy, mild left facial droop, tongue midline.  Left upper extremity flaccid, left lower extremity 1/5.  Right upper extremity no drift, right lower extremity 3/5 proximal and 4/5 distal.  Left upper and lower extremity sensation diminished.  Right finger-to-nose intact grossly.  Noticed that the patient has significant BP fluctuation recently, therefore etiology for patient stroke and extension of stroke likely due to significant BP fluctuations in the setting of severe intracranial stenosis.  Discussed with Dr. Rito Ehrlich, will hold off BP meds at this time  and slowly adding back for BP goal 1 30-1 50 given severe intracranial stenosis.  Continue increased aspirin 81 to 325 and continue Plavix and statin.  Avoid low BP.  PT/OT recommend SNF.  COVID management per primary team.  For detailed assessment and plan, please refer to below as I have made changes wherever appropriate.   Neurology will sign off. Please call with questions. Pt will follow up with Dr. Delena Bali at [facility] in about 4 weeks. Thanks for the consult.   Marvel Plan, MD PhD Stroke Neurology 05/24/2021 8:05 PM  Patient condition worsened due to stroke extension. I discussed with Dr. Rito Ehrlich. I spent  35 minutes in total face-to-face time with the patient, more than 50% of which was spent in counseling and coordination of care, reviewing test results, images and medication, and discussing the diagnosis, treatment plan and potential prognosis. This patient's care requiresreview of multiple databases, neurological assessment, discussion with family, other specialists and medical decision making of high complexity.      INTERVAL HISTORY Patient is seen in her room with two family members at the bedside.  She was noted to be hypotensive and initially responsive only to noxious stimuli but did become more responsive after repeated stimulation.  Head CT ordered given changes in  mental status, awaiting results.  Discussed hypotension with primary team.  Overnight, MRI showed progression of right ACA infarction.  This is potentially due to fluctuations in blood pressure and relative hypotension- antihypertensives discontinued. Vitals:   05/23/21 2329 05/24/21 0349 05/24/21 0700 05/24/21 1330  BP: (!) 164/75 113/72 132/64 (!) 117/54  Pulse: 96 98 96 98  Resp: 16 16 16 18   Temp: 98.9 F (37.2 C) 98.8 F (37.1 C) 98.7 F (37.1 C) 97.8 F (36.6 C)  TempSrc: Oral Oral Oral Oral  SpO2: 96%  97% 100%  Weight:      Height:       CBC:  Recent Labs  Lab 05/18/21 1243 05/20/21 0109  05/22/21 0249 05/24/21 1331  WBC 4.0 4.7 4.0 3.5*  NEUTROABS 2.9 3.2  --   --   HGB 11.6* 12.2 12.7 12.2  HCT 35.9* 37.0 38.7 36.8  MCV 79.1* 78.1* 77.6* 77.5*  PLT 215 205 249 [id number]    Basic Metabolic Panel:  Recent Labs  Lab 05/23/21 0021 05/24/21 1331  NA 137 136  K 3.4* 3.8  CL 99 98  CO2 23 24  GLUCOSE 148* 168*  BUN 11 34*  CREATININE 0.88 1.48*  CALCIUM 9.0 8.7*    Lipid Panel:  No results for input(s): CHOL, TRIG, HDL, CHOLHDL, VLDL, LDLCALC in the last 168 hours.  HgbA1c:  No results for input(s): HGBA1C in the last 168 hours.  Urine Drug Screen: No results for input(s): LABOPIA, COCAINSCRNUR, LABBENZ, AMPHETMU, THCU, LABBARB in the last 168 hours.  Alcohol Level No results for input(s): ETH in the last 168 hours.  IMAGING past 24 hours MR BRAIN WO CONTRAST  Result Date: 05/24/2021 CLINICAL DATA:  Stroke follow-up EXAM: MRI HEAD WITHOUT CONTRAST TECHNIQUE: Multiplanar, multiecho pulse sequences of the brain and surrounding structures were obtained without intravenous contrast. COMPARISON:  05/10/2021 FINDINGS: Brain: Progressive restricted diffusion in the parasagittal right frontal lobe, ACA distribution and attributed to infarct. 9 mm acute infarct lateral to the atrium of the right lateral ventricle. No acute hemorrhage, hydrocephalus, or collection. Vascular: Absent left transverse sigmoid sinus flow void on axial T2 weighted imaging, transient when correlated with coronal T2 weighted imaging. Skull and upper cervical spine: Normal marrow signal Sinuses/Orbits: Cataract resection.  Mild left gaze deviation. IMPRESSION: Progressive acute infarction in the right ACA distribution. Small acute infarct in the right periatrial white matter. Electronically Signed   By: Jorje Guild M.D.   On: 05/24/2021 05:52    PHYSICAL EXAM  Temp:  [97.8 F (36.6 C)-98.9 F (37.2 C)] 97.8 F (36.6 C) (12/28 1330) Pulse Rate:  [96-98] 98 (12/28 1330) Resp:  [16-18] 18 (12/28  1330) BP: (113-164)/(54-75) 117/54 (12/28 1330) SpO2:  [96 %-100 %] 100 % (12/28 1330)  General - Well nourished, well developed, pleasant elderly Caucasian lady, lethargic but in no acute distress   NEURO:  Mental Status: AA&Ox3  Speech/Language: speech is without dysarthria or aphasia.    Cranial Nerves:  II: PERRL. Visual fields full.  III, IV, VI: EOMI.  V: Sensation is intact to light touch and symmetrical to face.  VII: Smile is symmetrical.  VIII: hearing intact to voice. IX, X: Phonation is normal.  XII: tongue is midline without fasciculations. Motor: 5/5 strength to RUE and RLE, LUE and LLE flaccid Sensation- Intact to light touch bilaterally.  Gait- deferred    ASSESSMENT/PLAN Tina [name] is a 80 y.o. female with history of progressive memory loss, type 2 diabetes, HCOM, hypertension, hypothyroidism presenting with increased confusion, lethargy, weakness and a general decline in function over the last couple weeks.  She is followed with [facility] neurology for progressive memory loss for 2 years.  Her Namenda and Aricept have been recently changed which is what her family initially attributed her change to.  She went  for an MRI on Wednesday, 12/14 and her daughter noticed that Tina [name] was having progressive difficulty getting in and out of the car, which is typically not an issue for her. She has also started using a walker to get around her house. She did have a fall two weeks ago.  The MRI showed a right frontal parasagittal infarct. Overnight, MRI showed progression of right ACA infarction.  This is potentially due to fluctuations in blood pressure and relative hypotension- antihypertensives discontinued.  Today, she was noted to be hypotensive and initially responsive only to noxious stimuli but did become more responsive after repeated stimulation.  Head CT ordered given changes in  mental status, awaiting results.  Discussed hypotension with primary team.       Stroke - right ACA stroke extension and new left periventricular MW infarcts likely due to significant BP fluctuation in the setting of severe intracranial stenosis CTA head & neck- subacute infarct in the right frontal lobe in the ACA distribution, Occlusion of the right P2 segment shortly after the anterior communicating artery without appreciable reconstitution of flow. Occlusion of the left PCA at the P1/P2 junction with diminutive reconstitution of flow distally.  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  40 to 50% stenosis on the right carotid bulb.  MRI- late acute right frontal parasagittal infarct 2D Echo EF 70 to 75%, hyperdynamic function with a left ventricle.  Bubble study negative Repeat MRI Progressive right ACA infarct, 79mm infarct lateral to atrium of right lateral ventricle LDL 111 HgbA1c 9.2 VTE prophylaxis - Lovenox 40 mg daily aspirin 81 mg daily prior to admission, now on aspirin 81 mg daily and clopidogrel 75 mg daily.  For 3 weeks followed by Plavix alone  therapy recommendations:  SNF Disposition:  SNF  Hypertension Home meds: None Normotension Hydralazine 10 mg q6hr prn  Hyperlipidemia Home meds:  zetia 10mg , resumed in hospital LDL 111, goal < 70 Add atorvastatin 40mg   High intensity statin  Continue statin at discharge  Diabetes type II Uncontrolled Home meds:   HgbA1c 9.2, goal < 7.0 CBGs Recent Labs    05/23/21 2121 05/24/21 0616 05/24/21 1328  GLUCAP 179* 177* 164*     SSI  Hypotension BP initially in 60s/40s when seen today Rose to 110s/70s with vigorous stimulation IVF per primary team  Other Stroke Risk Factors Advanced Age >/= 65  Coronary artery disease  Other Active Problems Progressive memory loss Namenda 10 mg twice daily and Aricept milligrams twice daily Follows with  [place] , MSN, AGACNP-BC [facility] See Amion for schedule and pager information 05/24/2021 4:11 PM     To contact Stroke Continuity provider, please refer to [email or web address] After hours, contact General Neurology

## 2021-05-24 NOTE — Progress Notes (Addendum)
HOSPITAL MEDICINE OVERNIGHT EVENT NOTE    Notified by nursing that patient's systolic blood pressures are in the 90s.  Nursing reports that neurologically patient is unchanged.  Chart reviewed, patient currently admitted for ischemic stroke and lately has been experiencing bouts of hypotension with concern for extension of the stroke as a result.  Daytime attending and neurology notes reviewed.  Target systolic blood pressure should be between 130-150.  Medications reviewed, patient appropriately has had Coreg and losartan held, last doses were given on 12/27.  Patient is currently receiving an infusion of normal saline at 125 cc an hour.  We will go ahead and administer an additional 500 cc normal saline bolus followed by repeat blood pressure assessment.  Monitoring patient closely.  Marinda Elk  MD [facility]   ADDENDUM (12/29 12:30am)  Repeat blood pressure 107/59.  According to nursing, patient's lungs clear, no shortness of breath.  We will repeat 500 cc bolus.  Deno Lunger Karion Cudd  ADDENDUM (12/29 2:45am)  Second bolus complete.  BP 147/56.    Deno Lunger Anieya Helman

## 2021-05-24 NOTE — Plan of Care (Signed)
°  Problem: Education: Goal: Knowledge of General Education information will improve Description: Including pain rating scale, medication(s)/side effects and non-pharmacologic comfort measures Outcome: Progressing   Problem: Health Behavior/Discharge Planning: Goal: Ability to manage health-related needs will improve Outcome: Progressing   Problem: Clinical Measurements: Goal: Diagnostic test results will improve Outcome: Progressing   Problem: Activity: Goal: Risk for activity intolerance will decrease Outcome: Progressing   Problem: Nutrition: Goal: Adequate nutrition will be maintained Outcome: Progressing   Problem: Elimination: Goal: Will not experience complications related to bowel motility Outcome: Progressing Goal: Will not experience complications related to urinary retention Outcome: Progressing   Problem: Safety: Goal: Ability to remain free from injury will improve Outcome: Progressing   Problem: Skin Integrity: Goal: Risk for impaired skin integrity will decrease Outcome: Progressing   Problem: Education: Goal: Knowledge of disease or condition will improve Outcome: Progressing Goal: Knowledge of secondary prevention will improve (SELECT ALL) Outcome: Progressing Goal: Knowledge of patient specific risk factors will improve (INDIVIDUALIZE FOR PATIENT) Outcome: Progressing

## 2021-05-24 NOTE — Progress Notes (Signed)
[facility] Progress Note  Patient: Tina [name]    DHR:[id number]  DOA: 05/13/2021    Date of Service: the patient was seen and examined on 05/24/2021  Brief hospital course: 80 year old female with past medical history for diabetes mellitus, HOCM, hypertension, hypothyroidism and dementia presented to the emergency room on 12/17 for increased and confusion x1 day with functional decline for the past few weeks.  Recent MRI noted late acute right frontal parasagittal infarct.  CT angiogram noted evolving subacute infarct, but no acute finding.  Patient underwent stroke work-up with hospitalist complicated by UTI.  PT recommended skilled nursing however clearance COVID test done on 12/23 positive and outpatient remains in hospital for 10-day quarantine.  Patient started on Paxlovid.  Since 12/26, having episodes of headache and increased sundowning.  On night of 12/27, noted to have some left upper extremity weakness which was not present before (on the left lower extremity weakness).  On-call notified and stat MRI ordered noting progressive acute infarction in right PCA as well as small acute infarct in the right periatrial white matter.  Discussed case with neurology who feels that progression may be due to " relative hypotension" with patient at times having blood pressures in the 110s to 130s.  All blood pressure medicines stopped.  Patient seen complaining to daughter about sore throat.  Continues to have left upper extremity weakness.  Addendum: In midday, patient seen by neurology.  Noted to have decreased level of responsiveness with reported systolic blood pressure in the 60s.  Pressures improved and patient also given fluid bolus.  Stat head CT ordered and is pending.  Assessment and Plan: Cardiovascular and Mediastinum Essential hypertension Assessment & Plan Continue losartan and Coreg.  Initially had some permissive hypertension as stable.  As above, episodes of hypotension causing  worsening of stroke.  Chronic diastolic CHF (congestive heart failure) (HCC) Assessment & Plan Noted elevated blood pressure.  However BNP within normal limits.  Received a few doses of IV Lasix.  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  A1c of 9.2.  LDL of 111.  PT and OT recommending skilled nursing.  Echocardiogram noted preserved ejection fraction with negative bubble study.  On aspirin and Plavix plus statin.  Now with new left upper extremity weakness and MRI notes worsening progression of CVA plus new small acute CVA.  Neurology feels this may be from hypotension, perhaps from episodes of rapid atrial fibrillation?  Stat head CT pending.  Endocrine Hypothyroidism Assessment & Plan Continue Synthroid  Mixed diabetic hyperlipidemia associated with type 2 diabetes mellitus (HCC) Assessment & Plan On statin.  Type 2 diabetes mellitus with hypoglycemia without coma, without long-term current use of insulin (HCC) Assessment & Plan A1c of 9.2.  Home medications on glipizide and Actos.  Currently on sliding scale.  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  On Namenda.  Acute behavioral disturbances hopefully she will improve as stroke evolves and COVID/UTI treated  Acute metabolic encephalopathy Assessment & Plan Patient overall has had some progressive mental decline over the past 1 to 2 years.  Recently started on Namenda.  Mental status a little worse from baseline during hospitalization likely from stroke plus COVID plus UTI and prolonged hospitalization, especially at night.  More appropriate during the day.  Genitourinary Acute lower UTI Assessment & Plan Positive urinalysis on 12/21.  Urine culture is unremarkable.   Completed course of IV Rocephin  Other Frequent falls Assessment & Plan Due to progressive physical decline.  For skilled nursing.  COVID-19 virus infection Assessment & Plan Incidentally found on skilled nursing screening.  Now on 10-day quarantine.  Asymptomatic.  No respiratory issues.  On Paxlovid.  CRP at 1.    Body mass index is 24.6 kg/m.        Consultants: Neurology  Procedures: Echocardiogram  Antimicrobials: IV Rocephin 12/22-12/26  Code Status: Full code   Subjective: Seen before hypotensive event.  Patient responds to questions appropriately.  Fatigued.  Objective: Vital signs were reviewed and unremarkable. Vitals:   05/24/21 0700 05/24/21 1330  BP: 132/64 (!) 117/54  Pulse: 96 98  Resp: 16 18  Temp: 98.7 F (37.1 C) 97.8 F (36.6 C)  SpO2: 97% 100%    Intake/Output Summary (Last 24 hours) at 05/24/2021 1611 Last data filed at 05/24/2021 [id number] Gross per 24 hour  Intake --  Output 550 ml  Net -550 ml    Filed Weights   05/13/21 1941  Weight: 61 kg   Body mass index is 24.6 kg/m.  Exam:  General: Alert and oriented x2, fatigued HEENT: Normocephalic and atraumatic, mucous memories slightly dry Cardiovascular: Regular rate and rhythm, S1-S2 Respiratory: Clear to auscultation bilaterally Abdomen: Soft, nontender, nondistended, hypoactive bowel sounds Musculoskeletal: No clubbing or cyanosis, trace pitting edema Skin: No skin breaks, tears or lesions Psychiatry: Appropriate, no evidence of psychoses Neurology: Left lower extremity weakness.  Now with some left upper extremity weakness as well  Data Reviewed: My review of labs, imaging, notes and other tests shows no new significant findings.  MRI from early morning of 12/28 notes acute progression of previous infarct as well as new infarct in right periventricular matter.  In addition, stat head CT pending  Disposition:  Status is: Inpatient  Remains inpatient appropriate because:  Continued work-up.  Also needs skilled nursing.  Still has 4 days for COVID quarantine.  Family Communication: Daughter at bedside.  DVT Prophylaxis: enoxaparin (LOVENOX) injection 30 mg Start: 05/24/21 2200    Author: Annita Brod ,MD 05/24/2021 4:11 PM  To reach On-call, see care teams to locate the attending and reach out via [email or web address]. Between 7PM-7AM, please contact night-coverage If you still have difficulty reaching the attending provider, please page the [facility] (Director on Call) for [facility] on amion for assistance.

## 2021-05-24 NOTE — Telephone Encounter (Signed)
Medicare/[employer] supp no Chestine Spore, sent Lupita Leash a message she will reach out to the patient to schedule.

## 2021-05-24 NOTE — Evaluation (Signed)
Clinical/Bedside Swallow Evaluation Patient Details  Name: Tina [name] MRN: [id number] Date of Birth: 1940-09-10  Today's Date: 05/24/2021 Time: SLP Start Time (ACUTE ONLY): 6734 SLP Stop Time (ACUTE ONLY): 1510 SLP Time Calculation (min) (ACUTE ONLY): 25 min  Past Medical History:  Past Medical History:  Diagnosis Date   Carotid artery disease ([place])    a. Carotid US 7/15 - bilat ICA 40-59% >> FU 1 year //  b. Carotid US 10/17: bilat ICA 1-39% >> FU PRN (will get repeat in 2 years)    Diabetes mellitus    Heart murmur    History of nuclear stress test    a. Myoview 6/15 - Normal stress nuclear study. LV Ejection Fraction: 77%   HOCM (hypertrophic obstructive cardiomyopathy) ([place])    a. Echo 7/15 - Mild concentric LVH, vigorous LVF, EF 65-70%, LVOT dynamic obstruction at rest with peak gradient 20 mmHg, grade 1 diastolic dysfunction, mild SAM, mild to moderate MR // b. Echo 10/17: EF 55-60%, normal wall motion, grade 2 diastolic dysfunction, MAC, mild to moderate MR, mild LAE (LVOT 9 mmHg)   Hyperlipidemia    Hypertension    Hypothyroidism    Memory changes    Mitral regurgitation    Past Surgical History:  Past Surgical History:  Procedure Laterality Date   ABDOMINAL HYSTERECTOMY     BRAIN SURGERY     BREAST SURGERY     tumors right breast    CARDIOVASCULAR STRESS TEST  03/06/2007   EF 86%   COLONOSCOPY     GOITER REMOVAL  1972   LOOP RECORDER INSERTION N/A 05/17/2021   Procedure: LOOP RECORDER INSERTION;  Surgeon: Constance Haw, MD;  Location: [place] [facility];  Service: Cardiovascular;  Laterality: N/A;   SINUS EXPLORATION     US ECHOCARDIOGRAPHY  10/10/2006   EF 55-60%   HPI:  80 year old female with past medical history of hypertension, diabetes mellitus type 2, anxiety disorder, hyperlipidemia, HOCM,  hypothyroidism who presented to [place] emergency department with frequent falls; Upon evaluation at the emergency department, CT angiogram of the  head and neck revealed an evolving subacute infarct in the right frontal lobe with occlusion of the right ACA.  ER provider discussed case with Dr. Cheral Marker with neurology who stated either inpatient or outpatient continued work-up was appropriate however patient family was quite concerned and desired patient to be hospitalized for further work-up and therefore patient has been accepted for transfer to [facility] by the hospitalist group for continued work-up of the stroke as well as evaluating for other etiologies of patient's symptoms; 05/19/21 CXR negative for acute processes; 05/24/21 MRI brain revealed Progressive acute infarction in the right ACA distribution.     Small acute infarct in the right periatrial white matter; BSE generated d/t dysphagic symptoms    Assessment / Plan / Recommendation  Clinical Impression  Pt seen for a clinical swallowing evaluation with min consistencies assessed d/t pt being taken to CT for stat order scan.  Cognitive based oropharyngeal dysphagia noted characterized by oral holding, prolonged oral transit, mod verbal/tactile cues to elicit swallow response d/t delay in the initiation of the swallow observed throughout BSE.  Oral care completed prior to consumption with min residue located in left buccal cavity and removed.  No overt s/s of aspiration noted with limited consistencies assessed.  Family stated pt has c/o sore throat and "everything tastes like charcoal" since contracting Covid, so decreased satiety reported overall.  Recommend family observe general  swallowing precautions with current diet with mod verbal/tactile cues to swallow given depending on pt's alertness level as this has waxed/waned since admission.  ST will f/u for diet tolerance and education re: swallowing safety and ongoing cognitive assessment as pt with baseline mild STM loss prior to this admission per daughter/s report.  ST will f/u acutely for above mentioned goals.  Thank you for this  consult. SLP Visit Diagnosis: Dysphagia, oropharyngeal phase (R13.12);Cognitive communication deficit (R41.841)    Aspiration Risk  Mild aspiration risk;Risk for inadequate nutrition/hydration    Diet Recommendation   Heart healthy/thin liquids  Medication Administration: Whole meds with puree    Other  Recommendations Oral Care Recommendations: Oral care BID;Staff/trained caregiver to provide oral care    Recommendations for follow up therapy are one component of a multi-disciplinary discharge planning process, led by the attending physician.  Recommendations may be updated based on patient status, additional functional criteria and insurance authorization.  Follow up Recommendations Skilled nursing-short term rehab (<3 hours/day)      Assistance Recommended at Discharge Frequent or constant Supervision/Assistance  Functional Status Assessment Patient has had a recent decline in their functional status and demonstrates the ability to make significant improvements in function in a reasonable and predictable amount of time.  Frequency and Duration min 2x/week  1 week       Prognosis Prognosis for Safe Diet Advancement: Good      Swallow Study   General Date of Onset: 05/13/21 HPI: 80 year old female with past medical history of hypertension, diabetes mellitus type 2, anxiety disorder, hyperlipidemia, HOCM,  hypothyroidism who presented to [place] emergency department with frequent falls; Upon evaluation at the emergency department, CT angiogram of the head and neck revealed an evolving subacute infarct in the right frontal lobe with occlusion of the right ACA.  ER provider discussed case with Dr. Cheral Marker with neurology who stated either inpatient or outpatient continued work-up was appropriate however patient family was quite concerned and desired patient to be hospitalized for further work-up and therefore patient has been accepted for transfer to [facility] by the  hospitalist group for continued work-up of the stroke as well as evaluating for other etiologies of patient's symptoms; 05/19/21 CXR negative for acute processes; 05/24/21 MRI brain revealed Progressive acute infarction in the right ACA distribution.     Small acute infarct in the right periatrial white matter; BSE generated d/t dysphagic symptoms Type of Study: Bedside Swallow Evaluation Previous Swallow Assessment: passed [facility] swallow screen 05/13/21 Diet Prior to this Study: Regular;Thin liquids Temperature Spikes Noted: No Respiratory Status: Room air History of Recent Intubation: No Behavior/Cognition: Alert;Distractible;Requires cueing Oral Cavity Assessment: Dry Oral Care Completed by SLP: Yes Oral Cavity - Dentition: Adequate natural dentition Vision: Impaired for self-feeding Self-Feeding Abilities: Needs assist;Needs set up Patient Positioning: Upright in bed Baseline Vocal Quality: Not observed Volitional Cough: Cognitively unable to elicit Volitional Swallow: Unable to elicit    Oral/Motor/Sensory Function Overall Oral Motor/Sensory Function: Other (comment) (DTA)   Ice Chips Ice chips: Not tested   Thin Liquid Thin Liquid: Impaired Presentation: Straw;Spoon;Cup Oral Phase Functional Implications: Oral holding;Prolonged oral transit Pharyngeal  Phase Impairments: Suspected delayed Swallow    Nectar Thick Nectar Thick Liquid: Not tested   Honey Thick Honey Thick Liquid: Not tested   Puree Puree: Impaired Presentation: Spoon Oral Phase Functional Implications: Oral holding;Prolonged oral transit Pharyngeal Phase Impairments: Suspected delayed Swallow   Solid     Solid: Not tested Other Comments: CT came to  retrieve pt before solids assessed      Elvina Sidle, M.S., CCC-SLP 05/24/2021,4:12 PM

## 2021-05-24 NOTE — Progress Notes (Signed)
Repeat MRI obtained for new LUE drift. MRI reveals progressive acute infarction in the right ACA distribution. Also noted is a new small acute infarct in the right periatrial white matter. Images personally reviewed. Currently on ASA and Plavix. No emergent changes to her management. Stroke team to follow in the AM.   Electronically signed: Dr. Caryl Pina

## 2021-05-24 NOTE — Progress Notes (Signed)
Physical Therapy Treatment Patient Details Name: Tina [name] MRN: [id number] DOB: Jun 12, 1940 Today's Date: 05/24/2021   History of Present Illness Tina [name] is a 80 y.o. female who presented with increased confusion and lethargy x 1 day along with decline in function over the last couple weeks. 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 CT head 12/20 showing stable size of known subacute white matter infarct. PMH significant for DM2, HOCM, HTN, hypothyroidism, memory changes.    PT Comments    Pt received in supine, drowsy but agreeable to therapy session with encouragement, pt daughter and family present and encouraging. Pt lethargic throughout and noted increased spasticity in LUE today, pt unable to functionally use L hand/arm but was able to previous session. Pt with tendency to RLE extension even while seated, needs manual assist to place Rt foot on floor while seated. Pt needing +2 maxA to [place] for transfers and bed mobility. Pt with poor tolerance for seated/standing tasks and maintains eyes closed most of session. Pt continues to benefit from PT services to progress toward functional mobility goals.    Recommendations for follow up therapy are one component of a multi-disciplinary discharge planning process, led by the attending physician.  Recommendations may be updated based on patient status, additional functional criteria and insurance authorization.  Follow Up Recommendations  Skilled nursing-short term rehab (<3 hours/day)     Assistance Recommended at Discharge Frequent or constant Supervision/Assistance  Equipment Recommendations  None recommended by PT    Recommendations for Other Services       Precautions / Restrictions Precautions Precautions: Fall Precaution Comments: Airborne/Contact Restrictions Weight Bearing Restrictions: No     Mobility  Bed Mobility Overal bed mobility: Needs Assistance Bed Mobility: Rolling;Sidelying to Sit;Sit to  Sidelying Rolling: Max assist;+2 for safety/equipment Sidelying to sit: Max assist;+2 for safety/equipment     Sit to sidelying: Max assist;+2 for physical assistance General bed mobility comments: multimodal cues given, pt making little effort to participate in moving LLE and unable to functionally use LUE today, LUE in flexion synergy pattern and pt tending to keep RLE extended when seated, not letting R foot rest on floor    Transfers Overall transfer level: Needs assistance   Transfers: Sit to/from Stand Sit to Stand: Max assist;+2 physical assistance;+2 safety/equipment           General transfer comment: pt stood via face to face transfer using gait belt and +2 maxA, pt with poor tolerance and needing up to totalA for standing due to heavy Lt lean, unable to step or significantly use LLE for stability in stance; pt more lethargic and not speaking after transfer, so defer further transfer training for safety.    Ambulation/Gait                   Stairs             Wheelchair Mobility    Modified Rankin (Stroke Patients Only) Modified Rankin (Stroke Patients Only) Pre-Morbid Rankin Score: No significant disability Modified Rankin: Severe disability     Balance Overall balance assessment: Needs assistance;History of Falls Sitting-balance support: Feet supported Sitting balance-Leahy Scale: Poor Sitting balance - Comments: L lateral lean   Standing balance support: Bilateral upper extremity supported Standing balance-Leahy Scale: Zero Standing balance comment: needs +2 external support to maintain upright                            Cognition  Arousal/Alertness: Lethargic Behavior During Therapy: Flat affect Overall Cognitive Status: History of cognitive impairments - at baseline                                 General Comments: Pt maintained eyes closed today mostly, following <50% of simple commands and unable to  functionally use LUE today and c/o fatigue/dizziness with positional changes. BP stable seated EOB.        Exercises Other Exercises Other Exercises: static sitting with focus on midline posture and cervical extension Other Exercises: LUE PROM: shoulder flexion x5 reps and reaching but pt unable to functionally use LUE for reaching    General Comments General comments (skin integrity, edema, etc.): BP 120/60 seated EOB prior to standing, pt c/o fatigue and less responsive after standing x1 so returned to supine and BP WFL after return to supine      Pertinent Vitals/Pain Pain Assessment: Faces Faces Pain Scale: Hurts a little bit Pain Location: throat, possibly her back Pain Descriptors / Indicators: Grimacing;Sore Pain Intervention(s): Limited activity within patient's tolerance;Monitored during session;Repositioned    Home Living                          Prior Function            PT Goals (current goals can now be found in the care plan section) Acute Rehab PT Goals Patient Stated Goal: pt daughter would like her to go to rehab PT Goal Formulation: With patient Time For Goal Achievement: 05/30/21 Progress towards PT goals: Progressing toward goals    Frequency    Min 3X/week      PT Plan Current plan remains appropriate    Co-evaluation              AM-PAC PT "6 Clicks" Mobility   Outcome Measure  Help needed turning from your back to your side while in a flat bed without using bedrails?: Total (+2 maxA) Help needed moving from lying on your back to sitting on the side of a flat bed without using bedrails?: Total Help needed moving to and from a bed to a chair (including a wheelchair)?: Total Help needed standing up from a chair using your arms (e.g., wheelchair or bedside chair)?: Total Help needed to walk in hospital room?: Total Help needed climbing 3-5 steps with a railing? : Total 6 Click Score: 6    End of Session Equipment Utilized  During Treatment: Gait belt Activity Tolerance: Patient limited by lethargy;Patient limited by fatigue Patient left: with call bell/phone within reach;with family/visitor present;in bed;with bed alarm set;Other (comment) (pt with pillows under L hip/arm for support and pressure relief) Nurse Communication: Mobility status;Need for lift equipment (mechanical lift for OOB) PT Visit Diagnosis: History of falling (Z91.81);Unsteadiness on feet (R26.81)     Time: 3557-3220 PT Time Calculation (min) (ACUTE ONLY): 21 min  Charges:  $Therapeutic Activity: 8-22 mins                     Kyren Knick P., PTA Acute Rehabilitation Services Pager: [phone] Office: [phone]    Angus Palms 05/24/2021, 1:44 PM

## 2021-05-24 NOTE — Progress Notes (Signed)
Provider Julian Reil notified due to change noticed in left upper arm . Pt previously had little to no drift noted in left arm. Pt now unable to hold arm up for more than 1-2 seconds. Pt able to follow commands and answers orientation questions appropriately (name and date of birth). Was instructed to page neuro about changes. Talked with Dr. Otelia Limes. Stat MRI ordered. Will continue to monitor .

## 2021-05-25 DIAGNOSIS — U071 COVID-19: Secondary | ICD-10-CM | POA: Diagnosis not present

## 2021-05-25 DIAGNOSIS — R4182 Altered mental status, unspecified: Secondary | ICD-10-CM | POA: Diagnosis not present

## 2021-05-25 DIAGNOSIS — G9341 Metabolic encephalopathy: Secondary | ICD-10-CM | POA: Diagnosis not present

## 2021-05-25 DIAGNOSIS — F03918 Unspecified dementia, unspecified severity, with other behavioral disturbance: Secondary | ICD-10-CM | POA: Diagnosis not present

## 2021-05-25 LAB — GLUCOSE, CAPILLARY
Glucose-Capillary: 166 mg/dL — ABNORMAL HIGH (ref 70–99)
Glucose-Capillary: 179 mg/dL — ABNORMAL HIGH (ref 70–99)
Glucose-Capillary: 149 mg/dL — ABNORMAL HIGH (ref 70–99)
Glucose-Capillary: 151 mg/dL — ABNORMAL HIGH (ref 70–99)

## 2021-05-25 LAB — CBC
HCT: 34.4 % — ABNORMAL LOW (ref 36.0–46.0)
Hemoglobin: 11.2 g/dL — ABNORMAL LOW (ref 12.0–15.0)
MCH: 25.4 pg — ABNORMAL LOW (ref 26.0–34.0)
MCHC: 32.6 g/dL (ref 30.0–36.0)
MCV: 78 fL — ABNORMAL LOW (ref 80.0–100.0)
Platelets: 280 10*3/uL (ref 150–400)
RBC: 4.41 MIL/uL (ref 3.87–5.11)
RDW: 15.7 % — ABNORMAL HIGH (ref 11.5–15.5)
WBC: 3.6 10*3/uL — ABNORMAL LOW (ref 4.0–10.5)
nRBC: 0 % (ref 0.0–0.2)

## 2021-05-25 LAB — COMPREHENSIVE METABOLIC PANEL
ALT: 46 U/L — ABNORMAL HIGH (ref 0–44)
AST: 55 U/L — ABNORMAL HIGH (ref 15–41)
Albumin: 3.1 g/dL — ABNORMAL LOW (ref 3.5–5.0)
Alkaline Phosphatase: 66 U/L (ref 38–126)
Anion gap: 13 (ref 5–15)
BUN: 30 mg/dL — ABNORMAL HIGH (ref 8–23)
CO2: 22 mmol/L (ref 22–32)
Calcium: 8.6 mg/dL — ABNORMAL LOW (ref 8.9–10.3)
Chloride: 105 mmol/L (ref 98–111)
Creatinine, Ser: 1.05 mg/dL — ABNORMAL HIGH (ref 0.44–1.00)
GFR, Estimated: 53 mL/min — ABNORMAL LOW (ref >60)
Glucose, Bld: 152 mg/dL — ABNORMAL HIGH (ref 70–99)
Potassium: 4 mmol/L (ref 3.5–5.1)
Sodium: 140 mmol/L (ref 135–145)
Total Bilirubin: 1.1 mg/dL (ref 0.3–1.2)
Total Protein: 5.9 g/dL — ABNORMAL LOW (ref 6.5–8.1)

## 2021-05-25 LAB — URINALYSIS, ROUTINE W REFLEX MICROSCOPIC
Bacteria, UA: NONE SEEN
Bilirubin Urine: NEGATIVE
Glucose, UA: 50 mg/dL — AB
Ketones, ur: 20 mg/dL — AB
Leukocytes,Ua: NEGATIVE
Nitrite: NEGATIVE
Protein, ur: NEGATIVE mg/dL
Specific Gravity, Urine: 1.017 (ref 1.005–1.030)
pH: 5 (ref 5.0–8.0)

## 2021-05-25 LAB — BRAIN NATRIURETIC PEPTIDE: B Natriuretic Peptide: 87.6 pg/mL (ref 0.0–100.0)

## 2021-05-25 MED ORDER — NITROFURANTOIN MONOHYD MACRO 100 MG PO CAPS
100.0000 mg | ORAL_CAPSULE | Freq: Every day | ORAL | Status: DC
  Administered 2021-05-25 – 2021-05-30 (×6): 100 mg via ORAL
  Filled 2021-05-25 (×7): qty 1

## 2021-05-25 MED ORDER — MIDODRINE HCL 5 MG PO TABS
2.5000 mg | ORAL_TABLET | Freq: Three times a day (TID) | ORAL | Status: DC
  Administered 2021-05-25 – 2021-05-26 (×2): 2.5 mg via ORAL
  Filled 2021-05-25 (×3): qty 1

## 2021-05-25 MED ORDER — ENOXAPARIN SODIUM 40 MG/0.4ML IJ SOSY
40.0000 mg | PREFILLED_SYRINGE | Freq: Every day | INTRAMUSCULAR | Status: DC
  Administered 2021-05-25 – 2021-05-30 (×6): 40 mg via SUBCUTANEOUS
  Filled 2021-05-25 (×6): qty 0.4

## 2021-05-25 MED ORDER — NITROFURANTOIN MACROCRYSTAL 100 MG PO CAPS
100.0000 mg | ORAL_CAPSULE | Freq: Every day | ORAL | Status: DC
  Filled 2021-05-25 (×2): qty 1

## 2021-05-25 MED ORDER — SODIUM CHLORIDE 0.9 % IV BOLUS
500.0000 mL | Freq: Once | INTRAVENOUS | Status: AC
  Administered 2021-05-25: 01:00:00 500 mL via INTRAVENOUS

## 2021-05-25 NOTE — Progress Notes (Signed)
Pt left arm weakness has improved. Pt now able to lift arm and hold up at 90 degrees for 10 seconds. Pt also more alert and talkative . Will continue to monitor.

## 2021-05-25 NOTE — Plan of Care (Signed)
°  Problem: Education: Goal: Knowledge of General Education information will improve Description: Including pain rating scale, medication(s)/side effects and non-pharmacologic comfort measures Outcome: Progressing   Problem: Health Behavior/Discharge Planning: Goal: Ability to manage health-related needs will improve Outcome: Progressing   Problem: Clinical Measurements: Goal: Diagnostic test results will improve Outcome: Progressing   Problem: Activity: Goal: Risk for activity intolerance will decrease Outcome: Progressing   Problem: Nutrition: Goal: Adequate nutrition will be maintained Outcome: Progressing   Problem: Elimination: Goal: Will not experience complications related to bowel motility Outcome: Progressing Goal: Will not experience complications related to urinary retention Outcome: Progressing   Problem: Safety: Goal: Ability to remain free from injury will improve Outcome: Progressing   Problem: Skin Integrity: Goal: Risk for impaired skin integrity will decrease Outcome: Progressing   Problem: Education: Goal: Knowledge of disease or condition will improve Outcome: Progressing Goal: Knowledge of secondary prevention will improve (SELECT ALL) Outcome: Progressing Goal: Knowledge of patient specific risk factors will improve (INDIVIDUALIZE FOR PATIENT) Outcome: Progressing

## 2021-05-25 NOTE — Progress Notes (Signed)
[facility] Progress Note  Patient: Tina [name]    [id number]  DOA: 05/13/2021    Date of Service: the patient was seen and examined on 05/25/2021  Brief hospital course: 80 year old female with past medical history for diabetes mellitus, HOCM, hypertension, hypothyroidism and dementia presented to the emergency room on 12/17 for increased and confusion x1 day with functional decline for the past few weeks.  Recent MRI noted late acute right frontal parasagittal infarct.  CT angiogram noted evolving subacute infarct, but no acute finding.  Patient underwent stroke work-up with hospitalist complicated by UTI.  PT recommended skilled nursing however clearance COVID test done on 12/23 positive and outpatient remains in hospital for 10-day quarantine.  Patient started on Paxlovid.  Since 12/26, having episodes of headache and increased sundowning.  On night of 12/27, noted to have some left upper extremity weakness which was not present before (on the left lower extremity weakness).  On-call notified and stat MRI ordered noting progressive acute infarction in right PCA as well as small acute infarct in the right periatrial white matter.  Discussed case with neurology who feels that progression may be due to " relative hypotension" with patient at times having blood pressures in the 110s to 130s.  All blood pressure medicines stopped.  Patient seen complaining to daughter about sore throat.  Continues to have left upper extremity weakness.  By 12/28, patient continued to have episodes of hypotension necessitating fluid boluses and continuous fluid infusion at higher rates.  CT scan done on 12/29 unremarkable.  Started on midodrine 12/29.  Assessment and Plan: Cardiovascular and Mediastinum Essential hypertension Assessment & Plan Initially on losartan and Coreg.  Initially had some permissive hypertension as stable.  As above, episodes of hypotension causing worsening of stroke.  Medication  stopped due to hypotension.  Have had to start low-dose midodrine.  Chronic diastolic CHF (congestive heart failure) ([place]) Assessment & Plan Noted elevated blood pressure.  However BNP within normal limits.  Received a few doses of IV Lasix.  Has diuresed over 3 L in the last 2 days Lasix stopped when she had episodes of hypotension.  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  A1c of 9.2.  LDL of 111.  PT and OT recommending skilled nursing.  Echocardiogram noted preserved ejection fraction with negative bubble study.  On aspirin and Plavix plus statin.  Now with new left upper extremity weakness and MRI notes worsening progression of CVA plus new small acute CVA.  Neurology feels this may be from hypotension, perhaps from episodes of rapid atrial fibrillation?  Started on midodrine.  Patient noted to have some increased strength and movement in left upper extremity today.  Endocrine Hypothyroidism Assessment & Plan Continue Synthroid  Mixed diabetic hyperlipidemia associated with type 2 diabetes mellitus ([place]) Assessment & Plan On statin.  Type 2 diabetes mellitus with hypoglycemia without coma, without long-term current use of insulin (HCC) Assessment & Plan A1c of 9.2.  Home medications on glipizide and Actos.  Currently on sliding scale.  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  On Namenda.  Occasional behavioral disturbances, especially at night, hopefully she will improve as stroke evolves and COVID/UTI treated  Acute metabolic encephalopathy Assessment & Plan Patient overall has had some  progressive mental decline over the past 1 to 2 years.  Recently started on Namenda.  Mental status a little worse from  baseline during hospitalization likely from stroke plus COVID plus UTI and prolonged hospitalization, especially at night.  More appropriate during the day.  Genitourinary Acute lower UTI Assessment & Plan Positive urinalysis on 12/21.  Urine culture is unremarkable.  Completed course of IV Rocephin.  Unfortunately, still continuing to require Foley due to urinary retention.  We will start some suppressive antibiotics.  Other Frequent falls Assessment & Plan Due to progressive physical decline.  For skilled nursing.  COVID-19 virus infection Assessment & Plan Incidentally found on skilled nursing screening.  Now on 10-day quarantine.  Asymptomatic.  No respiratory issues.  On Paxlovid.  CRP at 1.    Body mass index is 24.6 kg/m.        Consultants: Neurology  Procedures: Echocardiogram  Antimicrobials: IV Rocephin 12/22-12/26  Code Status: Full code   Subjective: Patient tired.  Family notes some slight increase in appetite.  Objective: Vital signs were reviewed and unremarkable. Vitals:   05/25/21 0834 05/25/21 1247  BP: (!) 153/64 (!) 153/73  Pulse: 99 96  Resp: 18 18  Temp: 97.7 F (36.5 C) 99.5 F (37.5 C)  SpO2: 97% 96%    Intake/Output Summary (Last 24 hours) at 05/25/2021 1402 Last data filed at 05/25/2021 9244 Gross per 24 hour  Intake 935.42 ml  Output 725 ml  Net 210.42 ml    Filed Weights   05/13/21 1941  Weight: 61 kg   Body mass index is 24.6 kg/m.  Exam:  General: Alert and oriented x2, fatigued HEENT: Normocephalic and atraumatic, mucous memories slightly dry Cardiovascular: Regular rate and rhythm, S1-S2 Respiratory: Clear to auscultation bilaterally Abdomen: Soft, nontender, nondistended, hypoactive bowel sounds Musculoskeletal: No clubbing or cyanosis, trace pitting edema Skin: No skin breaks, tears or lesions Psychiatry: Appropriate, no evidence of psychoses Neurology: Left lower extremity weakness.  Left upper extremity  weakness improving.  Data Reviewed: My review of labs, imaging, notes and other tests shows no new significant findings.  MRI from early morning of 12/28 notes acute progression of previous infarct as well as new infarct in right periventricular matter.  CT scan of head done on 12/28 notes no new changes.  Disposition:  Status is: Inpatient  Remains inpatient appropriate because: Continued work-up.  Also needs skilled nursing.  Still has 3 days for COVID quarantine.  Family Communication: Daughter at bedside.  DVT Prophylaxis: enoxaparin (LOVENOX) injection 30 mg Start: 05/24/21 2200    Author: Hollice Espy ,MD 05/25/2021 2:02 PM  To reach On-call, see care teams to locate the attending and reach out via [email or web address]. Between 7PM-7AM, please contact night-coverage If you still have difficulty reaching the attending provider, please page the [facility] (Director on Call) for [facility] on amion for assistance.

## 2021-05-26 DIAGNOSIS — R4182 Altered mental status, unspecified: Secondary | ICD-10-CM | POA: Diagnosis not present

## 2021-05-26 DIAGNOSIS — U071 COVID-19: Secondary | ICD-10-CM | POA: Diagnosis not present

## 2021-05-26 DIAGNOSIS — G9341 Metabolic encephalopathy: Secondary | ICD-10-CM | POA: Diagnosis not present

## 2021-05-26 DIAGNOSIS — N390 Urinary tract infection, site not specified: Secondary | ICD-10-CM | POA: Diagnosis not present

## 2021-05-26 LAB — GLUCOSE, CAPILLARY
Glucose-Capillary: 146 mg/dL — ABNORMAL HIGH (ref 70–99)
Glucose-Capillary: 175 mg/dL — ABNORMAL HIGH (ref 70–99)
Glucose-Capillary: 259 mg/dL — ABNORMAL HIGH (ref 70–99)
Glucose-Capillary: 152 mg/dL — ABNORMAL HIGH (ref 70–99)

## 2021-05-26 MED ORDER — GLUCERNA SHAKE PO LIQD
237.0000 mL | Freq: Three times a day (TID) | ORAL | Status: DC
  Administered 2021-05-26 – 2021-05-31 (×13): 237 mL via ORAL

## 2021-05-26 MED ORDER — CYCLOBENZAPRINE HCL 10 MG PO TABS
5.0000 mg | ORAL_TABLET | Freq: Three times a day (TID) | ORAL | Status: DC | PRN
  Administered 2021-05-27 – 2021-05-28 (×2): 5 mg via ORAL
  Filled 2021-05-26 (×3): qty 1

## 2021-05-26 MED ORDER — CARVEDILOL 3.125 MG PO TABS
3.1250 mg | ORAL_TABLET | Freq: Two times a day (BID) | ORAL | Status: DC
  Administered 2021-05-26 – 2021-05-29 (×5): 3.125 mg via ORAL
  Filled 2021-05-26 (×6): qty 1

## 2021-05-26 MED ORDER — MIDODRINE HCL 5 MG PO TABS
2.5000 mg | ORAL_TABLET | Freq: Every day | ORAL | Status: DC
  Administered 2021-05-27: 2.5 mg via ORAL
  Filled 2021-05-26: qty 1

## 2021-05-26 NOTE — Progress Notes (Signed)
Physical Therapy Treatment Patient Details Name: Tina [name] MRN: [id number] DOB: 1941-02-14 Today's Date: 05/26/2021   History of Present Illness Tina [name] is a 80 y.o. female who presented with increased confusion and lethargy x 1 day along with decline in function over the last couple weeks. 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 CT head 12/20 showing stable size of known subacute white matter infarct. PMH significant for DM2, HOCM, HTN, hypothyroidism, memory changes.    PT Comments    Pt with increased engagement today and maintained eyes open with max encouragement. Pt interactive and able to answer questions appropriately. Pt with improved L UE and LE strength and was able to complete step pvit to chair with modAx2 and max step by step cues. Continue to recommend SNF Upon d/c. Acute PT to cont to follow.    Recommendations for follow up therapy are one component of a multi-disciplinary discharge planning process, led by the attending physician.  Recommendations may be updated based on patient status, additional functional criteria and insurance authorization.  Follow Up Recommendations  Skilled nursing-short term rehab (<3 hours/day)     Assistance Recommended at Discharge Frequent or constant Supervision/Assistance  Equipment Recommendations  None recommended by PT    Recommendations for Other Services       Precautions / Restrictions Precautions Precautions: Fall Precaution Comments: Airborne/Contact Restrictions Weight Bearing Restrictions: No     Mobility  Bed Mobility Overal bed mobility: Needs Assistance Bed Mobility: Supine to Sit     Supine to sit: HOB elevated;Max assist     General bed mobility comments: max directional verbal cues, max encouragment    Transfers Overall transfer level: Needs assistance Equipment used: 2 person hand held assist (face to face transfer with gait belt and bed pad) Transfers: Sit to/from Stand Sit to  Stand: +2 physical assistance;+2 safety/equipment;Mod assist     Step pivot transfers: Mod assist;+2 physical assistance     General transfer comment: pt with good command follow, modAx2 for power up once in standing minAx2, pt with good command follow to step by step sequencing of stepping to chair, pt with no L LE buckling and was able to advance each LE    Ambulation/Gait                   Stairs             Wheelchair Mobility    Modified Rankin (Stroke Patients Only) Modified Rankin (Stroke Patients Only) Pre-Morbid Rankin Score: No significant disability Modified Rankin: Severe disability     Balance Overall balance assessment: Needs assistance;History of Falls Sitting-balance support: Feet supported Sitting balance-Leahy Scale: Poor Sitting balance - Comments: L lateral lean Postural control: Left lateral lean (anterior and Lt lean) Standing balance support: Bilateral upper extremity supported Standing balance-Leahy Scale: Zero Standing balance comment: needs +2 external support to maintain upright                            Cognition Arousal/Alertness: Lethargic Behavior During Therapy: Flat affect Overall Cognitive Status: History of cognitive impairments - at baseline                                 General Comments: with max encouragement to keep eyes open pt able maintained eyes open once sitting EOB, pt more interactive today with encouragement, pt with good simple command  following        Exercises      General Comments General comments (skin integrity, edema, etc.): VSS      Pertinent Vitals/Pain Pain Assessment: 0-10 Pain Score: 3  Faces Pain Scale: Hurts a little bit Pain Location: under R UE Pain Descriptors / Indicators: Grimacing;Sore Pain Intervention(s): Other (comment) (RN in room and monitored/gave meds)    Home Living                          Prior Function            PT Goals  (current goals can now be found in the care plan section) Acute Rehab PT Goals PT Goal Formulation: With patient Time For Goal Achievement: 05/30/21 Potential to Achieve Goals: Good Progress towards PT goals: Progressing toward goals    Frequency    Min 3X/week      PT Plan Current plan remains appropriate    Co-evaluation PT/OT/SLP Co-Evaluation/Treatment: Yes Reason for Co-Treatment: To address functional/ADL transfers          AM-PAC PT "6 Clicks" Mobility   Outcome Measure  Help needed turning from your back to your side while in a flat bed without using bedrails?: A Lot (+2 maxA) Help needed moving from lying on your back to sitting on the side of a flat bed without using bedrails?: A Lot Help needed moving to and from a bed to a chair (including a wheelchair)?: A Lot Help needed standing up from a chair using your arms (e.g., wheelchair or bedside chair)?: A Lot Help needed to walk in hospital room?: Total Help needed climbing 3-5 steps with a railing? : Total 6 Click Score: 10    End of Session Equipment Utilized During Treatment: Gait belt Activity Tolerance: Patient tolerated treatment well Patient left: with call bell/phone within reach;with family/visitor present;Other (comment);in chair;with chair alarm set Nurse Communication: Mobility status;Need for lift equipment (mechanical lift for OOB) PT Visit Diagnosis: History of falling (Z91.81);Unsteadiness on feet (R26.81)     Time: [id number] PT Time Calculation (min) (ACUTE ONLY): 31 min  Charges:  $Therapeutic Activity: 8-22 mins                     Kittie Plater, PT, DPT Acute Rehabilitation Services Pager #: [phone] Office #: [phone]    Berline Lopes 05/26/2021, 2:50 PM

## 2021-05-26 NOTE — Plan of Care (Signed)
°  Problem: Education: Goal: Knowledge of General Education information will improve Description: Including pain rating scale, medication(s)/side effects and non-pharmacologic comfort measures Outcome: Progressing   Problem: Health Behavior/Discharge Planning: Goal: Ability to manage health-related needs will improve Outcome: Progressing   Problem: Clinical Measurements: Goal: Diagnostic test results will improve Outcome: Progressing   Problem: Activity: Goal: Risk for activity intolerance will decrease Outcome: Progressing   Problem: Nutrition: Goal: Adequate nutrition will be maintained Outcome: Progressing   Problem: Elimination: Goal: Will not experience complications related to bowel motility Outcome: Progressing Goal: Will not experience complications related to urinary retention Outcome: Progressing   Problem: Safety: Goal: Ability to remain free from injury will improve Outcome: Progressing   Problem: Skin Integrity: Goal: Risk for impaired skin integrity will decrease Outcome: Progressing   Problem: Education: Goal: Knowledge of disease or condition will improve Outcome: Progressing Goal: Knowledge of secondary prevention will improve (SELECT ALL) Outcome: Progressing Goal: Knowledge of patient specific risk factors will improve (INDIVIDUALIZE FOR PATIENT) Outcome: Progressing

## 2021-05-26 NOTE — Progress Notes (Signed)
Initial Nutrition Assessment  DOCUMENTATION CODES:   Not applicable  INTERVENTION:  -Glucerna Shake po TID, each supplement provides 220 kcal and 10 grams of protein -MVI with minerals daily  NUTRITION DIAGNOSIS:   Increased nutrient needs related to acute illness (COVID) as evidenced by estimated needs.  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 Hospital course further complicated by UTI and incidental COVID+ test 12/23 when being assessed for SNF. Pt remains hospitalized for 10-day quarantine.  Per MD, pt has been having increased episodes of headaches and sundowning over the last 4 days. On 12/27, pt noted to have new onset LUE weakness -- stat MRI ordered noting progressive acute infarction in right PCA as well as small acute infarct in the right periatrial white matter. Neurology evaluated pt and felt it may be d/t "relative hypotension" which pt continues to have episodes of. Started on midodrine yesterday.   Pt unavailable at time of RD visit; SLP in room. Unable to obtain diet/wt hx at this time and will attempt to obtain at follow-up.   Limited weight history to review from the last year.   PO Intake: 100% x 1 recorded meal   UOP: 941ml x24 hours I/O: -4374ml since admit  Medications: Scheduled Meds:  aspirin EC  325 mg Oral Daily   carvedilol  3.125 mg Oral BID WC   Chlorhexidine Gluconate Cloth  6 each Topical Daily   clopidogrel  75 mg Oral Daily   enoxaparin (LOVENOX) injection  40 mg Subcutaneous QHS   insulin aspart  0-6 Units Subcutaneous TID AC & HS   levothyroxine  100 mcg Oral Q0600   lip balm   Topical BID    melatonin  3 mg Oral QHS   [START ON 05/27/2021] midodrine  2.5 mg Oral Daily   multivitamin with minerals  1 tablet Oral Daily   nitrofurantoin (macrocrystal-monohydrate)  100 mg Oral QHS   rosuvastatin  5 mg Oral Daily   Labs: Recent Labs  Lab 05/23/21 0021 05/24/21 1331 05/25/21 0238  NA 137 136 140  K 3.4* 3.8 4.0  CL 99 98 105  CO2 23 24 22   BUN 11 34* 30*  CREATININE 0.88 1.48* 1.05*  CALCIUM 9.0 8.7* 8.6*  GLUCOSE 148* 168* 152*  CBGs: 146-175 x24 hours Elevated LFTs    NUTRITION - FOCUSED PHYSICAL EXAM: Unable to perform at this time. Will attempt at follow-up.   Diet Order:   Diet Order             Diet Carb Modified           Diet - low sodium heart healthy           Diet heart healthy/carb modified Room service appropriate? Yes; Fluid consistency: Thin  Diet effective now                   EDUCATION NEEDS:   Not appropriate for education at this time  Skin:  Skin Assessment: Skin Integrity Issues: Skin Integrity Issues:: Incisions Incisions: sternum  Last BM:  12/26  Height:   Ht Readings from Last 1 Encounters:  05/13/21 5\' 2"  (1.575 m)  Weight:   Wt Readings from Last 1 Encounters:  05/13/21 61 kg   BMI:  Body mass index is 24.6 kg/m.  Estimated Nutritional Needs:  Kcal:  1800-2000 Protein:  90-100 grams Fluid:  >1.8L     Rae Lips., MS, RD, LDN (she/her/hers) RD pager number and weekend/on-call pager number located in Amion.

## 2021-05-26 NOTE — Progress Notes (Signed)
Occupational Therapy Treatment Patient Details Name: Tina [name] MRN: [id number] DOB: 12-10-1940 Today's Date: 05/26/2021   History of present illness Tina [name] is a 80 y.o. female who presented with increased confusion and lethargy x 1 day along with decline in function over the last couple weeks. 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 CT head 12/20 showing stable size of known subacute white matter infarct. PMH significant for DM2, HOCM, HTN, hypothyroidism, memory changes.   OT comments  Pt making incremental progress this session. She requires mod A +2 to stand and pivot to chair. Overall continues to need multimodal cues to follow commands and participate. Towards the end of the session, pt was more interactive and answering questions. OT will continue to follow acutely.    Recommendations for follow up therapy are one component of a multi-disciplinary discharge planning process, led by the attending physician.  Recommendations may be updated based on patient status, additional functional criteria and insurance authorization.    Follow Up Recommendations  Skilled nursing-short term rehab (<3 hours/day)    Assistance Recommended at Discharge Frequent or constant Supervision/Assistance  Equipment Recommendations  Other (comment)    Recommendations for Other Services      Precautions / Restrictions Precautions Precautions: Fall Precaution Comments: Airborne/Contact Restrictions Weight Bearing Restrictions: No       Mobility Bed Mobility Overal bed mobility: Needs Assistance Bed Mobility: Supine to Sit     Supine to sit: HOB elevated;Max assist     General bed mobility comments: max directional verbal cues, max encouragment    Transfers Overall transfer level: Needs assistance Equipment used: 2 person hand held assist (face to face transfer with gait belt and bed pad) Transfers: Sit to/from Stand Sit to Stand: +2 physical assistance;+2  safety/equipment;Mod assist   Step pivot transfers: Mod assist;+2 physical assistance       General transfer comment: pt with good command follow, modAx2 for power up once in standing minAx2, pt with good command follow to step by step sequencing of stepping to chair, pt with no L LE buckling and was able to advance each LE     Balance Overall balance assessment: Needs assistance;History of Falls Sitting-balance support: Feet supported Sitting balance-Leahy Scale: Poor Sitting balance - Comments: L lateral lean Postural control: Left lateral lean (anterior and Lt lean) Standing balance support: Bilateral upper extremity supported Standing balance-Leahy Scale: Poor Standing balance comment: needs +2 external support to assist with steadying                           ADL either performed or assessed with clinical judgement   ADL Overall ADL's : Needs assistance/impaired                         Toilet Transfer: Moderate assistance;+2 for physical assistance;+2 for safety/equipment;Stand-pivot Toilet Transfer Details (indicate cue type and reason): simulated to recliner, needs assist to power to standing and steady as she transfers to chair           General ADL Comments: session focused on transferring OOB and command following. Pt more alert this session but continues to required multimodial cues to participate in all tasks.    Extremity/Trunk Assessment              Vision       Perception     Praxis      Cognition Arousal/Alertness: Lethargic Behavior During Therapy:  Flat affect Overall Cognitive Status: History of cognitive impairments - at baseline                                 General Comments: with max encouragement to keep eyes open pt able maintained eyes open once sitting EOB, pt more interactive today with encouragement, pt with good simple command following          Exercises     Shoulder Instructions        General Comments VSS on RA, Son present    Pertinent Vitals/ Pain       Pain Assessment: 0-10 Pain Score: 3  Pain Location: under R UE Pain Descriptors / Indicators: Grimacing;Sore Pain Intervention(s): Monitored during session;Repositioned  Home Living                                          Prior Functioning/Environment              Frequency  Min 2X/week        Progress Toward Goals  OT Goals(current goals can now be found in the care plan section)  Progress towards OT goals: Progressing toward goals  Acute Rehab OT Goals Patient Stated Goal: None stated OT Goal Formulation: With patient Time For Goal Achievement: 05/28/21 Potential to Achieve Goals: Good ADL Goals Pt Will Perform Grooming: with modified independence;standing Pt Will Perform Lower Body Bathing: with modified independence;sit to/from stand Pt Will Perform Lower Body Dressing: with modified independence;sit to/from stand Pt Will Transfer to Toilet: with modified independence;ambulating Additional ADL Goal #1: Pt will indep verbalize at least 3 fall prevention strategies to apply in the home setting  Plan Discharge plan remains appropriate    Co-evaluation    PT/OT/SLP Co-Evaluation/Treatment: Yes Reason for Co-Treatment: To address functional/ADL transfers   OT goals addressed during session: Strengthening/ROM;Proper use of Adaptive equipment and DME      AM-PAC OT "6 Clicks" Daily Activity     Outcome Measure   Help from another person eating meals?: A Little Help from another person taking care of personal grooming?: A Lot Help from another person toileting, which includes using toliet, bedpan, or urinal?: A Lot Help from another person bathing (including washing, rinsing, drying)?: Total Help from another person to put on and taking off regular upper body clothing?: A Little Help from another person to put on and taking off regular lower body clothing?: A Lot 6  Click Score: 13    End of Session Equipment Utilized During Treatment: Gait belt;Rolling walker (2 wheels)  OT Visit Diagnosis: Unsteadiness on feet (R26.81);Other abnormalities of gait and mobility (R26.89);Repeated falls (R29.6);Muscle weakness (generalized) (M62.81);History of falling (Z91.81);Pain;Hemiplegia and hemiparesis Hemiplegia - Right/Left: Left Hemiplegia - dominant/non-dominant: Non-Dominant Hemiplegia - caused by: Cerebral infarction   Activity Tolerance Patient tolerated treatment well   Patient Left in chair;with call bell/phone within reach;with chair alarm set   Nurse Communication Mobility status        Time: 8101-7510 OT Time Calculation (min): 25 min  Charges: OT General Charges $OT Visit: 1 Visit OT Treatments $Therapeutic Activity: 8-22 mins  Lan Mcneill H., OTR/L Acute Rehabilitation  Mehtab Dolberry Elane Zamariah Seaborn 05/26/2021, 7:21 PM

## 2021-05-26 NOTE — Progress Notes (Signed)
[facility] Progress Note  Patient: Tina [name]    [id number]  DOA: 05/13/2021    Date of Service: the patient was seen and examined on 05/26/2021  Brief hospital course: 80 year old female with past medical history for diabetes mellitus, HOCM, hypertension, hypothyroidism and dementia presented to the emergency room on 12/17 for increased and confusion x1 day with functional decline for the past few weeks.  Recent MRI noted late acute right frontal parasagittal infarct.  CT angiogram noted evolving subacute infarct, but no acute finding.  Patient underwent stroke work-up with hospitalist complicated by UTI.  PT recommended skilled nursing however clearance COVID test done on 12/23 positive and outpatient remains in hospital for 10-day quarantine.  Patient started on Paxlovid.  Since 12/26, having episodes of headache and increased sundowning.  On night of 12/27, noted to have some left upper extremity weakness which was not present before (on the left lower extremity weakness).  On-call notified and stat MRI ordered noting progressive acute infarction in right PCA as well as small acute infarct in the right periatrial white matter.  Discussed case with neurology who feels that progression may be due to " relative hypotension" with patient at times having blood pressures in the 110s to 130s.  All blood pressure medicines stopped.  Patient seen complaining to daughter about sore throat.  Continues to have left upper extremity weakness.  By 12/28, patient continued to have episodes of hypotension necessitating fluid boluses and continuous fluid infusion at higher rates.  CT scan done on 12/29 unremarkable.  Started on midodrine 12/29 which has greatly improved her blood pressure and left upper extremity strength.  Assessment and Plan: Cardiovascular and Mediastinum Essential hypertension Assessment & Plan Initially on losartan and Coreg.  Initially had some permissive hypertension as  stable.  As above, episodes of hypotension causing worsening of stroke.  Medication stopped due to hypotension.  Have had to start low-dose midodrine.  Chronic diastolic CHF (congestive heart failure) ([place]) Assessment & Plan Noted elevated blood pressure.  However BNP within normal limits.  Received a few doses of IV Lasix.  Has diuresed over 3 L in the last 2 days Lasix stopped when she had episodes of hypotension.  BNP on 12/29 at 88.  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  A1c of 9.2.  LDL of 111.  PT and OT recommending skilled nursing.  Echocardiogram noted preserved ejection fraction with negative bubble study.  On aspirin and Plavix plus statin.  Now with new left upper extremity weakness and MRI notes worsening progression of CVA plus new small acute CVA.  Neurology feels this may be from hypotension, perhaps from episodes of rapid atrial fibrillation?  Started on midodrine which she is keeping her blood pressure up leading to increased strength.  Endocrine Hypothyroidism Assessment & Plan Continue Synthroid  Mixed diabetic hyperlipidemia associated with type 2 diabetes mellitus ([place]) Assessment & Plan On statin.  Type 2 diabetes mellitus with hypoglycemia without coma, without long-term current use of insulin (HCC) Assessment & Plan A1c of 9.2.  Home medications on glipizide and Actos.  Currently on sliding scale.  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  On Namenda.  Occasional behavioral disturbances, especially at night, hopefully she will improve as stroke evolves and COVID/UTI treated  Acute metabolic encephalopathy Assessment &  Plan Patient overall has had some progressive mental decline over the past 1 to 2 years.  Recently started on Namenda.  Mental  status a little worse from baseline during hospitalization likely from stroke plus COVID plus UTI and prolonged hospitalization, especially at night.  More appropriate during the day.  Genitourinary Acute lower UTI Assessment & Plan Positive urinalysis on 12/21.  Urine culture is unremarkable.  Completed course of IV Rocephin.  Unfortunately, still continuing to require Foley due to urinary retention.  We will start some suppressive antibiotics.  Other Frequent falls Assessment & Plan Due to progressive physical decline.  For skilled nursing.  COVID-19 virus infection Assessment & Plan Incidentally found on skilled nursing screening.  Now on 10-day quarantine.  Asymptomatic.  No respiratory issues.  On Paxlovid.  CRP at 1.    Body mass index is 24.6 kg/m.  Nutrition Problem: Increased nutrient needs Etiology: acute illness (COVID)     Consultants: Neurology  Procedures: Echocardiogram  Antimicrobials: IV Rocephin 12/22-12/26  Code Status: Full code   Subjective: Patient tired.  Complains of back pain while sitting in chair.  Objective: Vital signs were reviewed and unremarkable. Vitals:   05/26/21 1300 05/26/21 1525  BP: (!) 163/75 136/75  Pulse: 86 96  Resp: 13 14  Temp:  98.8 F (37.1 C)  SpO2:      Intake/Output Summary (Last 24 hours) at 05/26/2021 1556 Last data filed at 05/26/2021 0448 Gross per 24 hour  Intake 120 ml  Output 925 ml  Net -805 ml    Filed Weights   05/13/21 1941  Weight: 61 kg   Body mass index is 24.6 kg/m.  Exam:  General: Alert and oriented x2, fatigued HEENT: Normocephalic and atraumatic, mucous memories slightly dry Cardiovascular: Regular rate and rhythm, S1-S2 Respiratory: Clear to auscultation bilaterally Abdomen: Soft, nontender, nondistended, hypoactive bowel sounds Musculoskeletal: No clubbing or cyanosis, trace pitting edema Skin: No skin breaks, tears or lesions Psychiatry: Appropriate, no evidence of  psychoses Neurology: Left lower extremity weakness.  Left upper extremity weakness improving.  Data Reviewed: My review of labs, imaging, notes and other tests shows no new significant findings.  MRI from early morning of 12/28 notes acute progression of previous infarct as well as new infarct in right periventricular matter.  CT scan of head done on 12/28 notes no new changes.  Disposition:  Status is: Inpatient  Remains inpatient appropriate because: Continued work-up.  Also needs skilled nursing.  Still has 3 days for COVID quarantine.  Family Communication: Son at bedside.  DVT Prophylaxis: enoxaparin (LOVENOX) injection 40 mg Start: 05/25/21 2200    Author: Hollice Espy ,MD 05/26/2021 3:56 PM  To reach On-call, see care teams to locate the attending and reach out via [email or web address]. Between 7PM-7AM, please contact night-coverage If you still have difficulty reaching the attending provider, please page the [facility] (Director on Call) for [facility] on amion for assistance.

## 2021-05-26 NOTE — Progress Notes (Signed)
Speech Language Pathology Treatment: Dysphagia;Cognitive-Linquistic  Patient Details Name: Tina [name] MRN: [id number] DOB: 1941-03-21 Today's Date: 05/26/2021 Time: 1021-1051 SLP Time Calculation (min) (ACUTE ONLY): 30 min  Assessment / Plan / Recommendation Clinical Impression  Pt lethargic, but able to maintian alertness for POs given min verbal cues. Overall itnake has been poor per son (present for session) and RN. When administered thin liquids via straw, bites of puree and regular textures, no overt s/sx of aspiration observed. Mild prolonged oral transit noted with solids, although complete oral clearance noted upon inspection. Pt refused more than a x3 bites and sips this date for clinical observation. Son reports that pt has baseline STM deficits, although she seems less aware of these deficits now. She was disoriented to place this date and when provided the name of this hospital, she recalled immediately and after a short delay. She recalled how to call for RN using call light after short delay as well. Recommend continue regular, thin liquid diet. Will f/u briefly for diet tolerance given limited intake this date and further dx treatment for cognitive functions as there appears to be an exacerbation from baseline.    HPI HPI: 80 year old female with past medical history of hypertension, diabetes mellitus type 2, anxiety disorder, hyperlipidemia, HOCM,  hypothyroidism who [facility] with frequent falls; Upon evaluation at the emergency department, CT angiogram of the head and neck revealed an evolving subacute infarct in the right frontal lobe with occlusion of the right ACA.  ER provider discussed case with Dr. Otelia Limes with neurology who stated either inpatient or outpatient continued work-up was appropriate however patient family was quite concerned and desired patient to be hospitalized for further work-up and therefore patient has been accepted  for transfer to [facility] by the hospitalist group for continued work-up of the stroke as well as evaluating for other etiologies of patient's symptoms; 05/19/21 CXR negative for acute processes; 05/24/21 MRI brain revealed Progressive acute infarction in the right ACA distribution.     Small acute infarct in the right periatrial white matter; BSE generated d/t dysphagic symptoms      SLP Plan  Continue with current plan of care      Recommendations for follow up therapy are one component of a multi-disciplinary discharge planning process, led by the attending physician.  Recommendations may be updated based on patient status, additional functional criteria and insurance authorization.    Recommendations  Diet recommendations: Regular;Thin liquid Liquids provided via: Straw;Cup Medication Administration: Crushed with puree Supervision: Trained caregiver to feed patient;Full supervision/cueing for compensatory strategies Compensations: Minimize environmental distractions;Slow rate;Small sips/bites;Follow solids with liquid Postural Changes and/or Swallow Maneuvers: Seated upright 90 degrees                Oral Care Recommendations: Oral care BID;Staff/trained caregiver to provide oral care Follow Up Recommendations: Skilled nursing-short term rehab (<3 hours/day) Assistance recommended at discharge: Frequent or constant Supervision/Assistance SLP Visit Diagnosis: Dysphagia, oropharyngeal phase (R13.12);Cognitive communication deficit (R41.841) Plan: Continue with current plan of care          Tina Echevaria, MA, CCC-SLP Acute Rehabilitation Services Office Number: [phone]  Tina [name]  05/26/2021, 12:24 PM

## 2021-05-27 DIAGNOSIS — N390 Urinary tract infection, site not specified: Secondary | ICD-10-CM | POA: Diagnosis not present

## 2021-05-27 DIAGNOSIS — R4182 Altered mental status, unspecified: Secondary | ICD-10-CM | POA: Diagnosis not present

## 2021-05-27 DIAGNOSIS — G9341 Metabolic encephalopathy: Secondary | ICD-10-CM | POA: Diagnosis not present

## 2021-05-27 DIAGNOSIS — U071 COVID-19: Secondary | ICD-10-CM | POA: Diagnosis not present

## 2021-05-27 LAB — GLUCOSE, CAPILLARY
Glucose-Capillary: 188 mg/dL — ABNORMAL HIGH (ref 70–99)
Glucose-Capillary: 167 mg/dL — ABNORMAL HIGH (ref 70–99)
Glucose-Capillary: 168 mg/dL — ABNORMAL HIGH (ref 70–99)
Glucose-Capillary: 141 mg/dL — ABNORMAL HIGH (ref 70–99)

## 2021-05-27 MED ORDER — MIDODRINE HCL 5 MG PO TABS: 2.5000 mg | ORAL_TABLET | Freq: Every day | ORAL | Status: DC

## 2021-05-27 MED ORDER — SODIUM CHLORIDE 0.9 % IV SOLN: INTRAVENOUS | Status: DC

## 2021-05-27 MED ORDER — TAMSULOSIN HCL 0.4 MG PO CAPS
0.4000 mg | ORAL_CAPSULE | Freq: Every day | ORAL | Status: DC
  Administered 2021-05-28: 0.4 mg via ORAL
  Filled 2021-05-27 (×2): qty 1

## 2021-05-27 MED ORDER — SODIUM CHLORIDE 0.9 % IV BOLUS
500.0000 mL | Freq: Once | INTRAVENOUS | Status: AC
  Administered 2021-05-27: 500 mL via INTRAVENOUS

## 2021-05-27 NOTE — Progress Notes (Signed)
[facility] Progress Note  Patient: Tina [name]    [id number]  DOA: 05/13/2021    Date of Service: the patient was seen and examined on 05/27/2021  Brief hospital course: 80 year old female with past medical history for diabetes mellitus, HOCM, hypertension, hypothyroidism and dementia presented to the emergency room on 12/17 for increased and confusion x1 day with functional decline for the past few weeks.  Recent MRI noted late acute right frontal parasagittal infarct.  CT angiogram noted evolving subacute infarct, but no acute finding.  Patient underwent stroke work-up with hospitalist complicated by UTI.  PT recommended skilled nursing however clearance COVID test done on 12/23 positive and outpatient remains in hospital for 10-day quarantine.  Patient started on Paxlovid.  Since 12/26, having episodes of headache and increased sundowning.  On night of 12/27, noted to have some left upper extremity weakness which was not present before (on the left lower extremity weakness).  On-call notified and stat MRI ordered noting progressive acute infarction in right PCA as well as small acute infarct in the right periatrial white matter.  Discussed case with neurology who feels that progression may be due to " relative hypotension" with patient at times having blood pressures in the 110s to 130s.  All blood pressure medicines stopped.  Patient seen complaining to daughter about sore throat.  Continues to have left upper extremity weakness.  By 12/28, patient continued to have episodes of hypotension necessitating fluid boluses and continuous fluid infusion at higher rates.  CT scan done on 12/29 unremarkable.  Started on midodrine 12/29 which has greatly improved her blood pressure and left upper extremity strength.  Attempted to stop this medication due to elevated blood pressures, however restarted due to drops again in BP  Assessment and Plan: Cardiovascular and Mediastinum Essential  hypertension Assessment & Plan Initially on losartan and Coreg.  Initially had some permissive hypertension as stable.  As above, episodes of hypotension causing worsening of stroke.  Medication stopped due to hypotension.  Have had to start low-dose midodrine.  Chronic diastolic CHF (congestive heart failure) ([place]) Assessment & Plan Noted elevated blood pressure.  However BNP within normal limits.  Received a few doses of IV Lasix.  Has diuresed over 3 L in the last 2 days Lasix stopped when she had episodes of hypotension.  BNP on 12/29 at 88.  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  A1c of 9.2.  LDL of 111.  PT and OT recommending skilled nursing.  Echocardiogram noted preserved ejection fraction with negative bubble study.  On aspirin and Plavix plus statin.  Now with new left upper extremity weakness and MRI notes worsening progression of CVA plus new small acute CVA.  Neurology feels this may be from hypotension, perhaps from episodes of rapid atrial fibrillation?  Initially started on midodrine which she is keeping her blood pressure up leading to increased strength.  Dose held this morning and by noon, pressures down to [id number] systolic.  We will restart midodrine at once a day  Endocrine Hypothyroidism Assessment & Plan Continue Synthroid  Mixed diabetic hyperlipidemia associated with type 2 diabetes mellitus ([place]) Assessment & Plan On statin.  Type 2 diabetes mellitus with hypoglycemia without coma, without long-term current use of insulin (HCC) Assessment & Plan A1c of 9.2.  Home medications on glipizide and Actos.  Currently on sliding scale.  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  On Namenda.  Occasional behavioral disturbances, especially at night, hopefully she  will improve as stroke evolves and COVID/UTI treated  Acute metabolic encephalopathy Assessment & Plan Patient overall has had some progressive mental decline over the past 1 to 2 years.  Recently started on Namenda.  Mental status a little worse from baseline during hospitalization likely from stroke plus COVID plus UTI and prolonged hospitalization, especially at night.  More appropriate during the day.  Genitourinary Acute lower UTI Assessment & Plan Positive urinalysis on 12/21.  Urine culture is unremarkable.  Completed course of IV Rocephin.  Unfortunately, still continuing to require Foley due to urinary retention.  We will start some suppressive antibiotics.  We started Flomax.  Other Frequent falls Assessment & Plan Due to progressive physical decline.  For skilled nursing.  COVID-19 virus infection Assessment & Plan Incidentally found on skilled nursing screening.  Now on 10-day quarantine which ends tomorrow.  Asymptomatic.  No respiratory issues.  On Paxlovid.  CRP at 1.    Body mass index is 24.6 kg/m.  Nutrition Problem: Increased nutrient needs Etiology: acute illness (COVID)     Consultants: Neurology  Procedures: Echocardiogram  Antimicrobials: IV Rocephin 12/22-12/26  Code Status: Full code   Subjective: Patient with eyes closed, fatigued  Objective: Vital signs were reviewed and unremarkable. Vitals:   05/27/21 0835 05/27/21 1257  BP: (!) 154/82 101/64  Pulse: (!) 105 76  Resp:  16  Temp:  97.9 F (36.6 C)  SpO2:  95%    Intake/Output Summary (Last 24 hours) at 05/27/2021 1405 Last data filed at 05/27/2021 0800 Gross per 24 hour  Intake --  Output 1200 ml  Net -1200 ml    Filed Weights   05/13/21 1941  Weight: 61 kg   Body mass index is 24.6 kg/m.  Exam:  General: Fatigued HEENT: Normocephalic and atraumatic, mucous memories slightly dry Cardiovascular: Regular rate and rhythm, S1-S2 Respiratory: Clear to auscultation  bilaterally Abdomen: Soft, nontender, nondistended, hypoactive bowel sounds Musculoskeletal: No clubbing or cyanosis, trace pitting edema Skin: No skin breaks, tears or lesions Psychiatry: Appropriate, no evidence of psychoses Neurology: Left lower extremity weakness.  Left upper extremity weakness improving.  Data Reviewed: My review of labs, imaging, notes and other tests shows no new significant findings.  MRI from early morning of 12/28 notes acute progression of previous infarct as well as new infarct in right periventricular matter.  CT scan of head done on 12/28 notes no new changes.  Disposition:  Status is: Inpatient  Remains inpatient appropriate because: Continued work-up.  Also needs skilled nursing.  Quarantine ends tomorrow.  Family Communication: Son at bedside.  DVT Prophylaxis: enoxaparin (LOVENOX) injection 40 mg Start: 05/25/21 2200    Author: Hollice Espy ,MD 05/27/2021 2:05 PM  To reach On-call, see care teams to locate the attending and reach out via [email or web address]. Between 7PM-7AM, please contact night-coverage If you still have difficulty reaching the attending provider, please page the [facility] (Director on Call) for [facility] on amion for assistance.

## 2021-05-27 NOTE — Progress Notes (Signed)
Patient has become very somnolent and difficult to arouse. Has been sleeping since 1230. Pt. Unable to participate in NIHSS for 1600 assessment. BP at 0800 was 154/82 and is currently 106/58. Charge RN and MD made aware. MD states this happened few days ago and is due to lowered BP. Verbal order for Nacl 500 ml bolus and Nacl continuous infusion at 125 ml/ hr. Will defer on further screening. RN will continue to monitor.

## 2021-05-28 DIAGNOSIS — G9341 Metabolic encephalopathy: Secondary | ICD-10-CM | POA: Diagnosis not present

## 2021-05-28 DIAGNOSIS — R4182 Altered mental status, unspecified: Secondary | ICD-10-CM | POA: Diagnosis not present

## 2021-05-28 DIAGNOSIS — F03918 Unspecified dementia, unspecified severity, with other behavioral disturbance: Secondary | ICD-10-CM | POA: Diagnosis not present

## 2021-05-28 DIAGNOSIS — U071 COVID-19: Secondary | ICD-10-CM | POA: Diagnosis not present

## 2021-05-28 LAB — GLUCOSE, CAPILLARY
Glucose-Capillary: 133 mg/dL — ABNORMAL HIGH (ref 70–99)
Glucose-Capillary: 216 mg/dL — ABNORMAL HIGH (ref 70–99)
Glucose-Capillary: 149 mg/dL — ABNORMAL HIGH (ref 70–99)
Glucose-Capillary: 160 mg/dL — ABNORMAL HIGH (ref 70–99)

## 2021-05-28 MED ORDER — MIDODRINE HCL 2.5 MG PO TABS
1.2500 mg | ORAL_TABLET | Freq: Every day | ORAL | Status: DC
  Administered 2021-05-28: 1.25 mg via ORAL
  Filled 2021-05-28 (×2): qty 1

## 2021-05-28 MED ORDER — MIDODRINE HCL 5 MG PO TABS
2.5000 mg | ORAL_TABLET | Freq: Every day | ORAL | Status: DC
  Administered 2021-05-29: 2.5 mg via ORAL
  Filled 2021-05-28: qty 1

## 2021-05-28 MED ORDER — SODIUM CHLORIDE 0.9 % IV SOLN: INTRAVENOUS | Status: DC

## 2021-05-28 MED ORDER — MIDODRINE HCL 2.5 MG PO TABS
1.2500 mg | ORAL_TABLET | Freq: Three times a day (TID) | ORAL | Status: AC
  Administered 2021-05-28: 1.25 mg via ORAL
  Filled 2021-05-28: qty 1

## 2021-05-28 NOTE — Plan of Care (Signed)
°  Problem: Education: Goal: Knowledge of General Education information will improve Description: Including pain rating scale, medication(s)/side effects and non-pharmacologic comfort measures Outcome: Progressing   Problem: Health Behavior/Discharge Planning: Goal: Ability to manage health-related needs will improve Outcome: Progressing   Problem: Clinical Measurements: Goal: Diagnostic test results will improve Outcome: Progressing   Problem: Activity: Goal: Risk for activity intolerance will decrease Outcome: Progressing   Problem: Nutrition: Goal: Adequate nutrition will be maintained Outcome: Progressing   Problem: Elimination: Goal: Will not experience complications related to bowel motility Outcome: Progressing Goal: Will not experience complications related to urinary retention Outcome: Progressing   Problem: Safety: Goal: Ability to remain free from injury will improve Outcome: Progressing   Problem: Skin Integrity: Goal: Risk for impaired skin integrity will decrease Outcome: Progressing   Problem: Education: Goal: Knowledge of disease or condition will improve Outcome: Progressing Goal: Knowledge of secondary prevention will improve (SELECT ALL) Outcome: Progressing Goal: Knowledge of patient specific risk factors will improve (INDIVIDUALIZE FOR PATIENT) Outcome: Progressing

## 2021-05-28 NOTE — Progress Notes (Signed)
[facility] Progress Note  Patient: Tina [name]    [id number]  DOA: 05/13/2021    Date of Service: the patient was seen and examined on 05/28/2021  Brief hospital course: 81 year old female with past medical history for diabetes mellitus, HOCM, hypertension, hypothyroidism and dementia presented to the emergency room on 12/17 for increased and confusion x1 day with functional decline for the past few weeks.  Recent MRI noted late acute right frontal parasagittal infarct.  CT angiogram noted evolving subacute infarct, but no acute finding.  Patient underwent stroke work-up with hospitalist complicated by UTI.  PT recommended skilled nursing however clearance COVID test done on 12/23 positive and outpatient remains in hospital for 10-day quarantine.  Patient started on Paxlovid.  Since 12/26, having episodes of headache and increased sundowning.  Since 12/27 evening, patient has had episodes of  "hypotension" being evens as sensitive as systolic in the [id number] despite being almost 2 weeks from her CVA, these episodes of lower blood pressure cause the patient to have evolving stroke (confirmed on MRI) and even a new small tiny stroke seen.  In discussion with neurology, they feel that her pressures need to be much higher.  To that end, have tried IV fluids which helped marginally but pressure significantly improved with midodrine.  When patient has episodes of worsening stroke, her left upper extremity becomes very weak  (which is new for her from the original CVA), but symptoms improve when pressures are higher, such as in the 150s.  Attempts to wean off or lessen dose of midodrine leads to episodes of hypotension.  In discussion with family the plan will be to keep midodrine going and look at weaning off perhaps in a month or so.  Assessment and Plan: Cardiovascular and Mediastinum Essential hypertension Assessment & Plan Initially on losartan and Coreg.  Initially had some permissive  hypertension as stable.  As above, episodes of hypotension causing worsening of stroke.  Medication stopped due to hypotension.  Have had to start low-dose midodrine.  Chronic diastolic CHF (congestive heart failure) ([place]) Assessment & Plan Noted elevated blood pressure.  However BNP within normal limits.  Received a few doses of IV Lasix.  Has diuresed over 3 L in the last 2 days Lasix stopped when she had episodes of hypotension.  BNP on 12/29 at 88.  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  A1c of 9.2.  LDL of 111.  PT and OT recommending skilled nursing.  Echocardiogram noted preserved ejection fraction with negative bubble study.  On aspirin and Plavix plus statin.  Now with new left upper extremity weakness and MRI notes worsening progression of CVA plus new small acute CVA.  Neurology feels this may be from hypotension, perhaps from episodes of rapid atrial fibrillation?  Initially started on midodrine which she is keeping her blood pressure up leading to increased strength.  Dose held this morning and by noon, pressures down to [id number] systolic.  We will restart midodrine at once a day  Endocrine Hypothyroidism Assessment & Plan Continue Synthroid  Mixed diabetic hyperlipidemia associated with type 2 diabetes mellitus ([place]) Assessment & Plan On statin.  Type 2 diabetes mellitus with hypoglycemia without coma, without long-term current use of insulin ([facility]) Assessment & Plan A1c of 9.2.  Home medications on glipizide and Actos.  Currently on sliding scale.  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  On Namenda.  Occasional behavioral disturbances, especially at night, hopefully she will improve as stroke evolves and COVID/UTI treated  Acute metabolic  encephalopathy Assessment & Plan Patient overall has had some progressive mental decline over the past 1 to 2 years.  Recently started on Namenda.  Mental status a little worse from baseline during hospitalization likely from stroke plus COVID plus UTI and prolonged hospitalization, especially at night.  More appropriate during the day.  Genitourinary Acute lower UTI Assessment & Plan Positive urinalysis on 12/21.  Urine culture is unremarkable.  Completed course of IV Rocephin.  Unfortunately, still continuing to require Foley due to urinary retention.  We will start some suppressive antibiotics.  We started Flomax.  Other Frequent falls Assessment & Plan Due to progressive physical decline.  For skilled nursing.  COVID-19 virus infection Assessment & Plan Incidentally found on skilled nursing screening.  Now on 10-day quarantine which ends tomorrow.  Asymptomatic.  No respiratory issues.  On Paxlovid.  CRP at 1.    Body mass index is 24.6 kg/m.  Nutrition Problem: Increased nutrient needs Etiology: acute illness (COVID)     Consultants: Neurology  Procedures: Echocardiogram  Antimicrobials: IV Rocephin 12/22-12/26  Code Status: Full code   Subjective: Patient a little less responsive today.  Noted with left upper extremity weakness.  Objective: Vital signs were reviewed and unremarkable. Vitals:   05/28/21 1702 05/28/21 2001  BP: (!) 154/75 (!) 153/57  Pulse: 87 89  Resp: 16 17  Temp: 99.1 F (37.3 C) 99.2 F (37.3 C)  SpO2: 98% 97%    Intake/Output Summary (Last 24 hours) at 05/28/2021 2016 Last data filed at 05/28/2021 1928 Gross per 24 hour  Intake --  Output 700 ml  Net -700 ml    Filed Weights   05/13/21 1941  Weight: 61 kg   Body mass index is 24.6 kg/m.  Exam:  General: Less responsive today. HEENT: Normocephalic and atraumatic, mucous memories slightly dry Cardiovascular: Regular rate and rhythm, S1-S2 Respiratory: Clear to auscultation  bilaterally Abdomen: Soft, nontender, nondistended, hypoactive bowel sounds Musculoskeletal: No clubbing or cyanosis, trace pitting edema Skin: No skin breaks, tears or lesions Psychiatry: Appropriate, no evidence of psychoses Neurology: Left lower extremity weakness-chronic since original CVA.  Left upper extremity weakness more present today..  Data Reviewed: My review of labs, imaging, notes and other tests shows no new significant findings.  MRI from early morning of 12/28 notes acute progression of previous infarct as well as new infarct in right periventricular matter.  CT scan of head done on 12/28 notes no new changes.  MRI 1/1 ordered.  Results are pending.  Disposition:  Status is: Inpatient  Remains inpatient appropriate because: Follow-up on MRI.  Needs skilled nursing, which was delayed due to a 10-day quarantine for COVID.  Family Communication: Daughter at bedside.  DVT Prophylaxis: enoxaparin (LOVENOX) injection 40 mg Start: 05/25/21 2200    Author: Annita Brod ,MD 05/28/2021 8:16 PM  To reach On-call, see care teams to locate the attending and reach out via [email or web address]. Between 7PM-7AM, please contact night-coverage If you still have difficulty reaching the attending provider, please page the [facility] (Director on Call) for [facility] on amion for assistance.

## 2021-05-29 ENCOUNTER — Inpatient Hospital Stay (HOSPITAL_COMMUNITY): Payer: Medicare Other

## 2021-05-29 DIAGNOSIS — I63521 Cerebral infarction due to unspecified occlusion or stenosis of right anterior cerebral artery: Principal | ICD-10-CM

## 2021-05-29 DIAGNOSIS — R4182 Altered mental status, unspecified: Secondary | ICD-10-CM | POA: Diagnosis not present

## 2021-05-29 DIAGNOSIS — U071 COVID-19: Secondary | ICD-10-CM | POA: Diagnosis not present

## 2021-05-29 DIAGNOSIS — I5032 Chronic diastolic (congestive) heart failure: Secondary | ICD-10-CM

## 2021-05-29 DIAGNOSIS — N390 Urinary tract infection, site not specified: Secondary | ICD-10-CM | POA: Diagnosis not present

## 2021-05-29 LAB — CBC
HCT: 31.7 % — ABNORMAL LOW (ref 36.0–46.0)
Hemoglobin: 10.3 g/dL — ABNORMAL LOW (ref 12.0–15.0)
MCH: 25.9 pg — ABNORMAL LOW (ref 26.0–34.0)
MCHC: 32.5 g/dL (ref 30.0–36.0)
MCV: 79.8 fL — ABNORMAL LOW (ref 80.0–100.0)
Platelets: 308 10*3/uL (ref 150–400)
RBC: 3.97 MIL/uL (ref 3.87–5.11)
RDW: 15.6 % — ABNORMAL HIGH (ref 11.5–15.5)
WBC: 6.4 10*3/uL (ref 4.0–10.5)
nRBC: 0 % (ref 0.0–0.2)

## 2021-05-29 LAB — GLUCOSE, CAPILLARY
Glucose-Capillary: 126 mg/dL — ABNORMAL HIGH (ref 70–99)
Glucose-Capillary: 133 mg/dL — ABNORMAL HIGH (ref 70–99)
Glucose-Capillary: 237 mg/dL — ABNORMAL HIGH (ref 70–99)
Glucose-Capillary: 164 mg/dL — ABNORMAL HIGH (ref 70–99)
Glucose-Capillary: 117 mg/dL — ABNORMAL HIGH (ref 70–99)

## 2021-05-29 LAB — BASIC METABOLIC PANEL
Anion gap: 10 (ref 5–15)
BUN: 15 mg/dL (ref 8–23)
CO2: 28 mmol/L (ref 22–32)
Calcium: 8.4 mg/dL — ABNORMAL LOW (ref 8.9–10.3)
Chloride: 104 mmol/L (ref 98–111)
Creatinine, Ser: 0.72 mg/dL (ref 0.44–1.00)
GFR, Estimated: >60 mL/min (ref >60)
Glucose, Bld: 133 mg/dL — ABNORMAL HIGH (ref 70–99)
Potassium: 3.3 mmol/L — ABNORMAL LOW (ref 3.5–5.1)
Sodium: 142 mmol/L (ref 135–145)

## 2021-05-29 NOTE — Plan of Care (Signed)
°  Problem: Education: Goal: Knowledge of General Education information will improve Description: Including pain rating scale, medication(s)/side effects and non-pharmacologic comfort measures Outcome: Progressing   Problem: Health Behavior/Discharge Planning: Goal: Ability to manage health-related needs will improve Outcome: Progressing   Problem: Clinical Measurements: Goal: Diagnostic test results will improve Outcome: Progressing   Problem: Activity: Goal: Risk for activity intolerance will decrease Outcome: Progressing   Problem: Nutrition: Goal: Adequate nutrition will be maintained Outcome: Progressing   Problem: Elimination: Goal: Will not experience complications related to bowel motility Outcome: Progressing Goal: Will not experience complications related to urinary retention Outcome: Progressing   Problem: Safety: Goal: Ability to remain free from injury will improve Outcome: Progressing   Problem: Skin Integrity: Goal: Risk for impaired skin integrity will decrease Outcome: Progressing   Problem: Education: Goal: Knowledge of disease or condition will improve Outcome: Progressing Goal: Knowledge of secondary prevention will improve (SELECT ALL) Outcome: Progressing Goal: Knowledge of patient specific risk factors will improve (INDIVIDUALIZE FOR PATIENT) Outcome: Progressing

## 2021-05-29 NOTE — TOC Progression Note (Signed)
Transition of Care [facility]) - Progression Note    Patient Details  Name: Tina [name] MRN: [id number] Date of Birth: Dec 15, 1940  Transition of Care [facility]) CM/SW Contact  Kermit Balo, RN Phone Number: 05/29/2021, 2:27 PM  Clinical Narrative:    Per MD pt is not medically ready. CM has updated [facility].  TOC following.   Expected Discharge Plan: Skilled Nursing Facility Barriers to Discharge: No Barriers Identified  Expected Discharge Plan and Services Expected Discharge Plan: Skilled Nursing Facility   Discharge Planning Services: CM Consult Post Acute Care Choice: Skilled Nursing Facility Living arrangements for the past 2 months: Single Family Home Expected Discharge Date: 05/17/21                                     Social Determinants of Health (SDOH) Interventions    Readmission Risk Interventions No flowsheet data found.

## 2021-05-29 NOTE — Progress Notes (Signed)
EEG complete - results pending 

## 2021-05-29 NOTE — Plan of Care (Signed)
Educated daughter and met with Dr. Leonie Man. Daughter verbalized understanding of MRI results and EEG ordered. Patient not participating in care at this time due stroke sequella.

## 2021-05-29 NOTE — Progress Notes (Signed)
Sent Dr. Starla Link secure chat and let MD know that patient's blood pressure is 93/40 and alert. MD acknowledged and discontinued coreg. No further orders.

## 2021-05-29 NOTE — Progress Notes (Signed)
Physical Therapy Treatment Patient Details Name: Tina [name] MRN: [id number] DOB: 13-Nov-1940 Today's Date: 05/29/2021   History of Present Illness Tina [name] is a 81 y.o. female who presented with increased confusion and lethargy x 1 day along with decline in function over the last couple weeks. 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 CT head 12/20 showing stable size of known subacute white matter infarct. 05/28/21 MRI showed progression of right ACA infarction. Head CT ordered given changes in mental status, awaiting results. PMH significant for DM2, HOCM, HTN, hypothyroidism, memory changes.    PT Comments    Pt received in supine, agreeable to therapy session with encouragement and with fair participation and tolerance for bed mobility and supine exercises. Pt needing up to maxA for supine<>long sit in bed with right rail support and unable to maintain >60 seconds at a time with mod/maxA trunk support but able to perform x3 trials. She also performed RLE AROM and LUE/LLE AA/PROM exercises with max cues. Pt in sidelying for pressure relief at end of session. Pt continues to benefit from PT services to progress toward functional mobility goals.   Recommendations for follow up therapy are one component of a multi-disciplinary discharge planning process, led by the attending physician.  Recommendations may be updated based on patient status, additional functional criteria and insurance authorization.  Follow Up Recommendations  Skilled nursing-short term rehab (<3 hours/day)     Assistance Recommended at Discharge Frequent or constant Supervision/Assistance  Equipment Recommendations  None recommended by PT    Recommendations for Other Services       Precautions / Restrictions Precautions Precautions: Fall Restrictions Weight Bearing Restrictions: No     Mobility  Bed Mobility Overal bed mobility: Needs Assistance Bed Mobility: Supine to Sit     Supine to sit: HOB  elevated;Max assist Sit to supine: Max assist   General bed mobility comments: pt able to pull up from elevated HOB posture on rt bed rail with maxA support into long sit (with bed in chair egress posture and legs reclined) x3 trials, maxA each attempt. Pt LUE with increased spasticity today and pt unable to utilize side rail with LUE. posterior supine scoot with RUE on overhead rail with max to totalA and bed in trendelenburg    Transfers                   General transfer comment: pt too fatigued to attempt after long sitting in bed.    Ambulation/Gait                   Stairs             Wheelchair Mobility    Modified Rankin (Stroke Patients Only) Modified Rankin (Stroke Patients Only) Pre-Morbid Rankin Score: No significant disability Modified Rankin: Severe disability     Balance Overall balance assessment: Needs assistance;History of Falls Sitting-balance support: Feet supported Sitting balance-Leahy Scale: Poor Sitting balance - Comments: requiring RUE on bed side rail due to left lean Postural control: Left lateral lean                                  Cognition Arousal/Alertness: Awake/alert Behavior During Therapy: Flat affect Overall Cognitive Status: History of cognitive impairments - at baseline  General Comments: Good following of simple 1-step mobility commands, increased drowsiness with fatigue toward end of session.        Exercises Other Exercises Other Exercises: static sitting with focus on midline posture and cervical extension Other Exercises: LUE PROM: shoulder flexion x5 reps and reaching but pt unable to functionally use LUE for reaching Other Exercises: LUE AAROM: gross grasp/finger extension, elbow flex/ext x5 reps Other Exercises: BLE A/PROM: (AROM on RLE PROM on LLE) hip flexion, LAQ, ankle pumps x10 reps ea    General Comments General comments (skin  integrity, edema, etc.): no acute s/sx distress, bed-level session for safety as pt quick to fatigue      Pertinent Vitals/Pain Pain Assessment: Faces Faces Pain Scale: Hurts little more Pain Location: upper back/neck Pain Descriptors / Indicators: Grimacing;Sore Pain Intervention(s): Limited activity within patient's tolerance;Monitored during session;Repositioned     PT Goals (current goals can now be found in the care plan section) Acute Rehab PT Goals Patient Stated Goal: pt daughter would like her to go to rehab PT Goal Formulation: With patient Time For Goal Achievement: 05/30/21 Progress towards PT goals: Progressing toward goals    Frequency    Min 3X/week      PT Plan Current plan remains appropriate    AM-PAC PT "6 Clicks" Mobility   Outcome Measure  Help needed turning from your back to your side while in a flat bed without using bedrails?: A Lot Help needed moving from lying on your back to sitting on the side of a flat bed without using bedrails?: Total (maxA to long sit, +2 maxA to EOB) Help needed moving to and from a bed to a chair (including a wheelchair)?: Total Help needed standing up from a chair using your arms (e.g., wheelchair or bedside chair)?: Total Help needed to walk in hospital room?: Total Help needed climbing 3-5 steps with a railing? : Total 6 Click Score: 7    End of Session Equipment Utilized During Treatment: Gait belt Activity Tolerance: Patient tolerated treatment well Patient left: in bed;with call bell/phone within reach;with bed alarm set (pillows under rt hip and heel to offload) Nurse Communication: Mobility status;Other (comment) (more alert this afternoon) PT Visit Diagnosis: History of falling (Z91.81);Unsteadiness on feet (R26.81)     Time: [id number] PT Time Calculation (min) (ACUTE ONLY): 20 min  Charges:  $Therapeutic Exercise: 8-22 mins                     Sabrina Arriaga P., PTA Acute Rehabilitation Services Pager:  [phone] Office: [place] 05/29/2021, 6:01 PM

## 2021-05-29 NOTE — Procedures (Signed)
Patient Name: Tina [name]  MRN: [id number]  Epilepsy Attending: Charlsie Quest  Referring Physician/Provider: Dr Delia Heady Date: 05/29/2021 Duration: 22.06 mins  Patient history: 81yo F with ams. EEG to evaluate for seizure  Level of alertness: Awake  AEDs during EEG study: None  Technical aspects: This EEG study was done with scalp electrodes positioned according to the 10-20 International system of electrode placement. Electrical activity was acquired at a sampling rate of 500Hz  and reviewed with a high frequency filter of 70Hz  and a low frequency filter of 1Hz . EEG data were recorded continuously and digitally stored.   Description: EEG showed continuous low amplitude 3-5hz  theta-delta slowing in right hemisphere admixed with sharp transients in right posterior quadrant. There is also 5 to 7 Hz theta slowing in left hemisphere admixed with intermittent 2-3hz  delta slowing. Hyperventilation and photic stimulation were not performed.     ABNORMALITY - Continuous slow, generalized and lateralized right hemisphere  IMPRESSION: This study is suggestive of cortical dysfunction arising from right hemisphere likely secondary to underlying structural abnormality. Additionally there is moderate diffuse encephalopathy, nonspecific etiology. No seizures or definite epileptiform discharges were seen throughout the recording.  If suspicion for ictal- interictal activity remains a concern, a prolonged study can be considered.   Tina [name] 

## 2021-05-29 NOTE — Progress Notes (Signed)
STROKE TEAM PROGRESS NOTE            INTERVAL HISTORY Stroke team was called back to evaluate patient as patient has had some fluctuating mental status changes as well as left arm weakness and underwent another MRI scan of the brain this morning which shows further extension of the right ACA infarct as well as new tiny punctate left hippocampal infarct.  Patient's daughter is at the bedside.  Review of patient's blood pressure shows fluctuation from 157/95 to 93/40 which is likely responsible since she has known left PCA occlusion.  No definite visualized seizure-like activity.  Stat EEG was obtained to rule out seizure activity given hippocampal infarct but showed only continuous generalized slowing without seizure activity. Vitals:   05/29/21 0833 05/29/21 1130 05/29/21 1209 05/29/21 1536  BP: (!) 157/95 (!) 93/40 (!) 119/58 138/61  Pulse: 93 63 69 75  Resp: 16 14  14   Temp: 98.8 F (37.1 C) 97.9 F (36.6 C)  98.3 F (36.8 C)  TempSrc: Oral Axillary  Oral  SpO2: 97% 97% 98% 97%  Weight:      Height:       CBC:  Recent Labs  Lab 05/25/21 0238 05/29/21 0214  WBC 3.6* 6.4  HGB 11.2* 10.3*  HCT 34.4* 31.7*  MCV 78.0* 79.8*  PLT 280 308   Basic Metabolic Panel:  Recent Labs  Lab 05/25/21 0238 05/29/21 0214  NA 140 142  K 4.0 3.3*  CL 105 104  CO2 22 28  GLUCOSE 152* 133*  BUN 30* 15  CREATININE 1.05* 0.72  CALCIUM 8.6* 8.4*   Lipid Panel:  No results for input(s): CHOL, TRIG, HDL, CHOLHDL, VLDL, LDLCALC in the last 168 hours.  HgbA1c:  No results for input(s): HGBA1C in the last 168 hours.  Urine Drug Screen: No results for input(s): LABOPIA, COCAINSCRNUR, LABBENZ, AMPHETMU, THCU, LABBARB in the last 168 hours.  Alcohol Level No results for input(s): ETH in the last 168 hours.  IMAGING past 24 hours MR BRAIN WO CONTRAST  Result Date: 05/29/2021 CLINICAL DATA:  Increased confusion. EXAM: MRI HEAD WITHOUT CONTRAST TECHNIQUE: Multiplanar, multiecho pulse  sequences of the brain and surrounding structures were obtained without intravenous contrast. COMPARISON:  CT head without contrast 05/24/2021. MR head without contrast 0/28/22. CTA head and neck 05/20/2021 FINDINGS: Brain: Diffusion-weighted images demonstrate progression of the right ACA territory infarcts the on the cingulate gyrus. There is now involvement the medial right frontal area and superior frontal gyrus. A focus of restricted diffusion adjacent to the atrium of the right lateral ventricle is relatively stable measuring 12 mm. A new punctate focus of restricted diffusion is present in the lateral left hippocampus, evident on image 65 of series 5 and image 47 of series 7. T2 signal changes are noted within the areas of restricted diffusion. Remote lacunar infarct in the left caudate head is again noted. Dilated perivascular spaces are present throughout the basal ganglia. White matter changes extend into the brainstem are stable. Vascular: Flow is present in the major intracranial arteries. Skull and upper cervical spine: The craniocervical junction is normal. Upper cervical spine is within normal limits. Marrow signal is unremarkable. Sinuses/Orbits: A polyp or retention cyst is present in the right maxillary sinus. Mucosal thickening is present the anterior ethmoid air cells bilaterally. Mild mucosal thickening is present in the right sphenoid sinus. Small mastoid effusions are present. No obstructing nasopharyngeal lesion is present. Bilateral lens replacements are noted. Globes and orbits are otherwise unremarkable. IMPRESSION:  1. Progression of right ACA territory infarcts involving the cingulate gyrus and medial right frontal area. 2. New punctate focus of restricted diffusion in the lateral left hippocampus. This may represent a small infarct. 3. Stable atrophy and white matter disease. 4. Remote lacunar infarct of the left caudate head. These results were called by telephone at the time of  interpretation on 05/29/2021 at 11:05am to provider K. Hanley Ben, who verbally acknowledged these results. Electronically Signed   By: Marin Roberts M.D.   On: 05/29/2021 11:11   EEG adult  Result Date: 05/29/2021 Charlsie Quest, MD     05/29/2021  2:57 PM Patient Name: Tina [name] MRN: [id number] Epilepsy Attending: Charlsie Quest Referring Physician/Provider: Dr Delia Heady Date: 05/29/2021 Duration: 22.06 mins Patient history: 81yo F with ams. EEG to evaluate for seizure Level of alertness: Awake AEDs during EEG study: None Technical aspects: This EEG study was done with scalp electrodes positioned according to the 10-20 International system of electrode placement. Electrical activity was acquired at a sampling rate of 500Hz  and reviewed with a high frequency filter of 70Hz  and a low frequency filter of 1Hz . EEG data were recorded continuously and digitally stored. Description: EEG showed continuous low amplitude 3-5hz  theta-delta slowing in right hemisphere admixed with sharp transients in right posterior quadrant. There is also 5 to 7 Hz theta slowing in left hemisphere admixed with intermittent 2-3hz  delta slowing. Hyperventilation and photic stimulation were not performed.   ABNORMALITY - Continuous slow, generalized and lateralized right hemisphere IMPRESSION: This study is suggestive of cortical dysfunction arising from right hemisphere likely secondary to underlying structural abnormality. Additionally there is moderate diffuse encephalopathy, nonspecific etiology. No seizures or definite epileptiform discharges were seen throughout the recording. If suspicion for ictal- interictal activity remains a concern, a prolonged study can be considered. Priyanka    PHYSICAL EXAM  Temp:  [97.5 F (36.4 C)-99.2 F (37.3 C)] 98.3 F (36.8 C) (01/02 1536) Pulse Rate:  [63-93] 75 (01/02 1536) Resp:  [14-17] 14 (01/02 1536) BP: (93-157)/(40-95) 138/61 (01/02 1536) SpO2:  [97 %-98 %] 97 % (01/02  1536)  General -frail pleasant elderly Caucasian lady, lethargic but in no acute distress   NEURO:  Mental Status: Patient is drowsy but can be aroused and follows simple commands Speech/Language: speech is hypophonic without dysarthria or aphasia.   She was able to tell me the name of her daughter and grandkids with some prompts. Cranial Nerves:  II: PERRL. Visual fields full.  III, IV, VI: EOMI.  V: Sensation is intact to light touch and symmetrical to face.  VII: Smile is symmetrical.  VIII: hearing intact to voice. IX, X: Phonation is normal.  XII: tongue is midline without fasciculations. Motor: 5/5 strength to RUE and RLE, LUE and LLE flaccid with minimal trace withdrawal. Sensation- Intact to light touch bilaterally.  Gait- deferred    ASSESSMENT/PLAN Ms. KIYLA RINGLER is a 81 y.o. female with history of progressive memory loss, type 2 diabetes, HCOM, hypertension, hypothyroidism presenting with increased confusion, lethargy, weakness and a general decline in function over the last couple weeks.  She is followed with [facility] neurology for progressive memory loss for 2 years.  Her Namenda and Aricept have been recently changed which is what her family initially attributed her change to.  She went for an MRI on Wednesday, 12/14 and her daughter noticed that Ms. Tomczak was having progressive difficulty getting in and out of the car, which is typically not an  issue for her. She has also started using a walker to get around her house. She did have a fall two weeks ago.  The MRI showed a right frontal parasagittal infarct. Overnight, MRI showed progression of right ACA infarction.  This is potentially due to fluctuations in blood pressure and relative hypotension- antihypertensives discontinued.  Today, she was noted to be hypotensive and initially responsive only to noxious stimuli but did become more responsive after repeated stimulation.  Head CT ordered given changes in  mental status,  awaiting results.  Discussed hypotension with primary team.      Stroke - right ACA stroke extension and new left periventricular MW as well as left hippocampal infarcts likely due to significant BP fluctuation in the setting of severe intracranial stenosis CTA head & neck- subacute infarct in the right frontal lobe in the ACA distribution, Occlusion of the right P2 segment shortly after the anterior communicating artery without appreciable reconstitution of flow. Occlusion of the left PCA at the P1/P2 junction with diminutive reconstitution of flow distally.  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  40 to 50% stenosis on the right carotid bulb.  MRI- late acute right frontal parasagittal infarct 2D Echo EF 70 to 75%, hyperdynamic function with a left ventricle.  Bubble study negative Repeat MRI Progressive right ACA infarct, 9mm infarct lateral to atrium of right lateral ventricle LDL 111 HgbA1c 9.2 VTE prophylaxis - Lovenox 40 mg daily aspirin 81 mg daily prior to admission, now on aspirin 81 mg daily and clopidogrel 75 mg daily.  For 3 weeks followed by Plavix alone  therapy recommendations:  SNF Disposition:  SNF  Hypertension Home meds: None Normotension Hydralazine 10 mg q6hr prn  Hyperlipidemia Home meds:  zetia 10mg , resumed in hospital LDL 111, goal < 70 Add atorvastatin 40mg   High intensity statin  Continue statin at discharge  Diabetes type II Uncontrolled Home meds:   HgbA1c 9.2, goal < 7.0 CBGs Recent Labs    05/29/21 0824 05/29/21 1148 05/29/21 1550  GLUCAP 133* 237* 164*    SSI  Hypotension BP initially in 60s/40s when seen today Rose to 110s/70s with vigorous stimulation IVF per primary team  Other Stroke Risk Factors Advanced Age >/= 65  Coronary artery disease  Other Active Problems Progressive memory  loss Namenda 10 mg twice daily and Aricept 10 mg  twice daily Follows with [facility]  The patient's neurological exam unfortunately continues to fluctuate which is mostly related to hemodynamics from underlying intracranial stenosis and blood pressure fluctuations.  Recommend hold blood pressure medications and keep systolic 130-150 range.  Check EEG for seizure activity.  Long discussion with patient and daughter at bedside and answered questions.  Discussed with Dr.Kshitz.  Greater than 50% time during this 35-minute visit was spent in counseling and coordination of care discussion with daughter and care team and answering questions.  Follow-up as an outpatient stroke clinic in 2 months Delia HeadyPramod Gershom Brobeck, MD   To contact Stroke Continuity provider, please refer to [email or web address]. After hours, contact General Neurology

## 2021-05-29 NOTE — Progress Notes (Addendum)
Patient ID: Tina [name], female   DOB: 1941-01-06, 81 y.o.   MRN: [id number]  PROGRESS NOTE    JAYLANNI GRAHOVAC  [id number] DOB: 1940-11-02 DOA: 05/13/2021 PCP: Leonard Downing, MD   Brief Narrative:  81 year old female with history of diabetes mellitus type 2, HOCM, hypertension, hypothyroidism, dementia presented on 05/13/2021 with increasing confusion with functional decline for the past few weeks.  Recent MRI noted late acute right frontal parasagittal infarct.  CT angiogram showed evolving subacute infarct, but no acute finding.  She underwent stroke work-up.  Hospital stay complicated by UTI.  PT recommended SNF placement.  She tested positive for COVID on 05/19/2021; subsequently treated with paxlovid and 10-day quarantine.    Hospital course also complicated with episodes of sundowning and hypotension.  Repeat MRI of the brain showed progression of stroke.  Neurology recommended to avoid episodes of hypotension.  Patient was started on midodrine and IV fluids.  Apparently, when blood pressure drops, left upper extremity becomes weaker and symptoms improve when blood pressure improves.  Assessment & Plan:   Acute/subacute CVA with extension -MRI findings and discussion as above.  Neurology had already signed off. -A1c 10.2.  LDL 111.  Echo showed preserved EF with negative bubble study. 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 -Apparently, when blood pressure drops, left upper extremity becomes weaker and symptoms improve when blood pressure improves. -Patient apparently had worsening left sided weakness and mild new right-sided weakness on 05/28/2021.  MRI of brain done today showed progression of right ACA territory infarcts and new possible left-sided infarct.  I have spoken to Dr. Sethi/neurology regarding the same who will  evaluate the patient today.  I have also discussed with the patient's daughter on phone today. -Continue aspirin Plavix and statin.  Hypertension with recurrent episodes of hypotension -Patient has had episodes of recent hypotension and subsequent elevated blood pressure as well. -Neurology recently had recommended to avoid hypotension.  She has been started on midodrine.  We will continue midodrine.  DC Coreg for now. -Currently on IV fluids: Decrease to 75 cc an hour  Chronic diastolic CHF -Strict input and output.  Daily weights.  Patient received few doses of IV Lasix and diuresed well and subsequently Lasix was stopped because of episodes of hypotension.  COVID-19 infection  -Tested positive on 05/19/2021. -subsequently treated with paxlovid and 10-day quarantine.   -Currently on room air.  DC isolation.  Diabetes mellitus type 2 with hyperglycemia -Continue CBGs with SSI  Dementia -Continue Namenda.  Fall precautions.  Hypothyroidism-continue levothyroxine  Hyperlipidemia Continue rosuvastatin  Acute lower UTI  Acute urinary retention -Urine culture negative.  Completed course of IV Rocephin.  Currently has Foley catheter.  She has been started on suppressive nitrofurantoin.    Generalized deconditioning Frequent falls -PT recommending SNF placement.  Social worker following. -Overall prognosis is guarded to poor.  Palliative care consult for goals of care discussion.   DVT prophylaxis: Lovenox Code Status: Full Family Communication: Daughter on phone on 05/29/2021  disposition Plan: Status is: Inpatient  Remains inpatient appropriate because: Of severity of illness.  Worsening extremity weakness with MRI showing extension of stroke and possibly a new stroke.   Consultants: Neurology/cardiology  Procedures: Echo/loop recorder placement  Antimicrobials:  Anti-infectives (From admission, onward)    Start     Dose/Rate Route Frequency Ordered Stop  05/25/21  2300  nitrofurantoin (macrocrystal-monohydrate) (MACROBID) capsule 100 mg        100 mg Oral Daily at bedtime 05/25/21 2202     05/25/21 2200  nitrofurantoin (MACRODANTIN) capsule 100 mg  Status:  Discontinued        100 mg Oral Daily at bedtime 05/25/21 1404 05/25/21 2202   05/20/21 1000  remdesivir 100 mg in sodium chloride 0.9 % 100 mL IVPB  Status:  Discontinued       See Hyperspace for full Linked Orders Report.   100 mg 200 mL/hr over 30 Minutes Intravenous Daily 05/19/21 1257 05/19/21 1308   05/19/21 1400  nirmatrelvir/ritonavir EUA (renal dosing) (PAXLOVID) 2 tablet        2 tablet Oral 2 times daily 05/19/21 1308 05/24/21 0216   05/19/21 1345  remdesivir 200 mg in sodium chloride 0.9% 250 mL IVPB  Status:  Discontinued       See Hyperspace for full Linked Orders Report.   200 mg 580 mL/hr over 30 Minutes Intravenous Once 05/19/21 1257 05/19/21 1308   05/18/21 0900  cefTRIAXone (ROCEPHIN) 1 g in sodium chloride 0.9 % 100 mL IVPB  Status:  Discontinued        1 g 200 mL/hr over 30 Minutes Intravenous Every 24 hours 05/18/21 0810 05/22/21 0851        Subjective: Patient seen and examined at bedside.  Awake, slow to respond.  Does not feel well and feels more weak in her extremities.  No overnight fever or vomiting reported.  Objective: Vitals:   05/28/21 2343 05/29/21 0400 05/29/21 0833 05/29/21 1130  BP: (!) 133/56 138/64 (!) 157/95 (!) 93/40  Pulse: 65 82 93 63  Resp: 17 17 16 14   Temp: 97.7 F (36.5 C) (!) 97.5 F (36.4 C) 98.8 F (37.1 C) 97.9 F (36.6 C)  TempSrc: Oral Oral Oral Axillary  SpO2: 97% 97% 97% 97%  Weight:      Height:        Intake/Output Summary (Last 24 hours) at 05/29/2021 1144 Last data filed at 05/29/2021 1100 Gross per 24 hour  Intake 2265.49 ml  Output 1100 ml  Net 1165.49 ml   Filed Weights   05/13/21 1941  Weight: 61 kg    Examination:  General exam: Appears calm and comfortable.  Looks chronically ill and deconditioned.  Currently  on room air Respiratory system: Bilateral decreased breath sounds at bases Cardiovascular system: S1 & S2 heard, Rate controlled Gastrointestinal system: Abdomen is nondistended, soft and nontender. Normal bowel sounds heard. Extremities: No cyanosis, clubbing; trace lower extremity edema present  Central nervous system: Awake, slow to respond, slightly confused to time.  No focal neurological deficits.  Bilateral extremity weakness present, more on the left side  skin: No obvious petechiae/lesions Psychiatry: Affect is mostly flat.  Poor historian.   Data Reviewed: I have personally reviewed following labs and imaging studies  CBC: Recent Labs  Lab 05/24/21 1331 05/25/21 0238 05/29/21 0214  WBC 3.5* 3.6* 6.4  HGB 12.2 11.2* 10.3*  HCT 36.8 34.4* 31.7*  MCV 77.5* 78.0* 79.8*  PLT 264 280 [id number]   Basic Metabolic Panel: Recent Labs  Lab 05/23/21 0021 05/24/21 1331 05/25/21 0238 05/29/21 0214  NA 137 136 140 142  K 3.4* 3.8 4.0 3.3*  CL 99 98 105 104  CO2 23 24 22 28   GLUCOSE 148* 168* 152* 133*  BUN 11 34* 30* 15  CREATININE 0.88 1.48* 1.05* 0.72  CALCIUM 9.0 8.7* 8.6* 8.4*  GFR: Estimated Creatinine Clearance: 46.6 mL/min (by C-G formula based on SCr of 0.72 mg/dL). Liver Function Tests: Recent Labs  Lab 05/25/21 0238  AST 55*  ALT 46*  ALKPHOS 66  BILITOT 1.1  PROT 5.9*  ALBUMIN 3.1*   No results for input(s): LIPASE, AMYLASE in the last 168 hours. No results for input(s): AMMONIA in the last 168 hours. Coagulation Profile: No results for input(s): INR, PROTIME in the last 168 hours. Cardiac Enzymes: No results for input(s): CKTOTAL, CKMB, CKMBINDEX, TROPONINI in the last 168 hours. BNP (last 3 results) No results for input(s): PROBNP in the last 8760 hours. HbA1C: No results for input(s): HGBA1C in the last 72 hours. CBG: Recent Labs  Lab 05/28/21 1204 05/28/21 1654 05/28/21 2130 05/29/21 0610 05/29/21 0824  GLUCAP 216* 149* 160* 126* 133*    Lipid Profile: No results for input(s): CHOL, HDL, LDLCALC, TRIG, CHOLHDL, LDLDIRECT in the last 72 hours. Thyroid Function Tests: No results for input(s): TSH, T4TOTAL, FREET4, T3FREE, THYROIDAB in the last 72 hours. Anemia Panel: No results for input(s): VITAMINB12, FOLATE, FERRITIN, TIBC, IRON, RETICCTPCT in the last 72 hours. Sepsis Labs: No results for input(s): PROCALCITON, LATICACIDVEN in the last 168 hours.  No results found for this or any previous visit (from the past 240 hour(s)).       Radiology Studies: MR BRAIN WO CONTRAST  Result Date: 05/29/2021 CLINICAL DATA:  Increased confusion. EXAM: MRI HEAD WITHOUT CONTRAST TECHNIQUE: Multiplanar, multiecho pulse sequences of the brain and surrounding structures were obtained without intravenous contrast. COMPARISON:  CT head without contrast 05/24/2021. MR head without contrast 0/28/22. CTA head and neck 05/20/2021 FINDINGS: Brain: Diffusion-weighted images demonstrate progression of the right ACA territory infarcts the on the cingulate gyrus. There is now involvement the medial right frontal area and superior frontal gyrus. A focus of restricted diffusion adjacent to the atrium of the right lateral ventricle is relatively stable measuring 12 mm. A new punctate focus of restricted diffusion is present in the lateral left hippocampus, evident on image 65 of series 5 and image 47 of series 7. T2 signal changes are noted within the areas of restricted diffusion. Remote lacunar infarct in the left caudate head is again noted. Dilated perivascular spaces are present throughout the basal ganglia. White matter changes extend into the brainstem are stable. Vascular: Flow is present in the major intracranial arteries. Skull and upper cervical spine: The craniocervical junction is normal. Upper cervical spine is within normal limits. Marrow signal is unremarkable. Sinuses/Orbits: A polyp or retention cyst is present in the right maxillary sinus.  Mucosal thickening is present the anterior ethmoid air cells bilaterally. Mild mucosal thickening is present in the right sphenoid sinus. Small mastoid effusions are present. No obstructing nasopharyngeal lesion is present. Bilateral lens replacements are noted. Globes and orbits are otherwise unremarkable. IMPRESSION: 1. Progression of right ACA territory infarcts involving the cingulate gyrus and medial right frontal area. 2. New punctate focus of restricted diffusion in the lateral left hippocampus. This may represent a small infarct. 3. Stable atrophy and white matter disease. 4. Remote lacunar infarct of the left caudate head. These results were called by telephone at the time of interpretation on 05/29/2021 at 11:05am to provider K. Starla Link, who verbally acknowledged these results. Electronically Signed   By: San Morelle M.D.   On: 05/29/2021 11:11        Scheduled Meds:  aspirin EC  325 mg Oral Daily   Chlorhexidine Gluconate Cloth  6 each Topical Daily  clopidogrel  75 mg Oral Daily   enoxaparin (LOVENOX) injection  40 mg Subcutaneous QHS   feeding supplement (GLUCERNA SHAKE)  237 mL Oral TID BM   insulin aspart  0-6 Units Subcutaneous TID AC & HS   levothyroxine  100 mcg Oral Q0600   lip balm   Topical BID   melatonin  3 mg Oral QHS   midodrine  2.5 mg Oral Daily   multivitamin with minerals  1 tablet Oral Daily   nitrofurantoin (macrocrystal-monohydrate)  100 mg Oral QHS   rosuvastatin  5 mg Oral Daily   Continuous Infusions:  sodium chloride 100 mL/hr at 05/29/21 [place], MD [facility] 05/29/2021, 11:44 AM

## 2021-05-30 ENCOUNTER — Other Ambulatory Visit (HOSPITAL_COMMUNITY): Payer: Self-pay

## 2021-05-30 DIAGNOSIS — G9341 Metabolic encephalopathy: Secondary | ICD-10-CM | POA: Diagnosis not present

## 2021-05-30 DIAGNOSIS — R4182 Altered mental status, unspecified: Secondary | ICD-10-CM | POA: Diagnosis not present

## 2021-05-30 DIAGNOSIS — U071 COVID-19: Secondary | ICD-10-CM | POA: Diagnosis not present

## 2021-05-30 DIAGNOSIS — N390 Urinary tract infection, site not specified: Secondary | ICD-10-CM | POA: Diagnosis not present

## 2021-05-30 LAB — BASIC METABOLIC PANEL
Anion gap: 12 (ref 5–15)
BUN: 8 mg/dL (ref 8–23)
CO2: 25 mmol/L (ref 22–32)
Calcium: 8.2 mg/dL — ABNORMAL LOW (ref 8.9–10.3)
Chloride: 101 mmol/L (ref 98–111)
Creatinine, Ser: 0.64 mg/dL (ref 0.44–1.00)
GFR, Estimated: >60 mL/min (ref >60)
Glucose, Bld: 150 mg/dL — ABNORMAL HIGH (ref 70–99)
Potassium: 2.9 mmol/L — ABNORMAL LOW (ref 3.5–5.1)
Sodium: 138 mmol/L (ref 135–145)

## 2021-05-30 LAB — CBC WITH DIFFERENTIAL/PLATELET
Abs Immature Granulocytes: 0.02 10*3/uL (ref 0.00–0.07)
Basophils Absolute: 0 10*3/uL (ref 0.0–0.1)
Basophils Relative: 0 %
Eosinophils Absolute: 0.1 10*3/uL (ref 0.0–0.5)
Eosinophils Relative: 1 %
HCT: 35 % — ABNORMAL LOW (ref 36.0–46.0)
Hemoglobin: 11.1 g/dL — ABNORMAL LOW (ref 12.0–15.0)
Immature Granulocytes: 0 %
Lymphocytes Relative: 9 %
Lymphs Abs: 0.6 10*3/uL — ABNORMAL LOW (ref 0.7–4.0)
MCH: 25.1 pg — ABNORMAL LOW (ref 26.0–34.0)
MCHC: 31.7 g/dL (ref 30.0–36.0)
MCV: 79.2 fL — ABNORMAL LOW (ref 80.0–100.0)
Monocytes Absolute: 0.9 10*3/uL (ref 0.1–1.0)
Monocytes Relative: 13 %
Neutro Abs: 5 10*3/uL (ref 1.7–7.7)
Neutrophils Relative %: 77 %
Platelets: 377 10*3/uL (ref 150–400)
RBC: 4.42 MIL/uL (ref 3.87–5.11)
RDW: 15.6 % — ABNORMAL HIGH (ref 11.5–15.5)
WBC: 6.6 10*3/uL (ref 4.0–10.5)
nRBC: 0 % (ref 0.0–0.2)

## 2021-05-30 LAB — GLUCOSE, CAPILLARY
Glucose-Capillary: 145 mg/dL — ABNORMAL HIGH (ref 70–99)
Glucose-Capillary: 177 mg/dL — ABNORMAL HIGH (ref 70–99)
Glucose-Capillary: 166 mg/dL — ABNORMAL HIGH (ref 70–99)
Glucose-Capillary: 165 mg/dL — ABNORMAL HIGH (ref 70–99)

## 2021-05-30 LAB — MAGNESIUM: Magnesium: 1.6 mg/dL — ABNORMAL LOW (ref 1.7–2.4)

## 2021-05-30 MED ORDER — POTASSIUM CHLORIDE CRYS ER 20 MEQ PO TBCR
40.0000 meq | EXTENDED_RELEASE_TABLET | ORAL | Status: AC
  Administered 2021-05-30 (×2): 40 meq via ORAL
  Filled 2021-05-30 (×2): qty 2

## 2021-05-30 MED ORDER — BISACODYL 10 MG RE SUPP: 10.0000 mg | Freq: Every day | RECTAL | Status: DC | PRN

## 2021-05-30 MED ORDER — POLYETHYLENE GLYCOL 3350 17 G PO PACK: 17.0000 g | PACK | Freq: Every day | ORAL | Status: DC | PRN

## 2021-05-30 MED ORDER — SENNOSIDES-DOCUSATE SODIUM 8.6-50 MG PO TABS
1.0000 | ORAL_TABLET | Freq: Two times a day (BID) | ORAL | Status: DC
  Administered 2021-05-30 – 2021-05-31 (×3): 1 via ORAL
  Filled 2021-05-30 (×3): qty 1

## 2021-05-30 MED ORDER — MAGNESIUM SULFATE 2 GM/50ML IV SOLN
2.0000 g | Freq: Once | INTRAVENOUS | Status: AC
  Administered 2021-05-30: 2 g via INTRAVENOUS
  Filled 2021-05-30: qty 50

## 2021-05-30 NOTE — Progress Notes (Addendum)
Patient ID: Tina [name], female   DOB: 10/31/1940, 80 y.o.   MRN: [id number]  PROGRESS NOTE    CYANNE FOGAL  [id number] DOB: 1941/02/26 DOA: 05/13/2021 PCP: Leonard Downing, MD   Brief Narrative:  81 year old female with history of diabetes mellitus type 2, HOCM, hypertension, hypothyroidism, dementia presented on 05/13/2021 with increasing confusion with functional decline for the past few weeks.  Recent MRI noted late acute right frontal parasagittal infarct.  CT angiogram showed evolving subacute infarct, but no acute finding.  She underwent stroke work-up.  Hospital stay complicated by UTI.  PT recommended SNF placement.  She tested positive for COVID on 05/19/2021; subsequently treated with paxlovid and 10-day quarantine.    Hospital course also complicated with episodes of sundowning and hypotension.  Repeat MRI of the brain showed progression of stroke.  Neurology recommended to avoid episodes of hypotension.  Patient was started on midodrine and IV fluids.  Apparently, when blood pressure drops, left upper extremity becomes weaker and symptoms improve when blood pressure improves.  Patient had repeat MRI of brain done on 05/29/2021 for worsening left sided weakness and some right-sided weakness which showed progression of right ICA territory infarcts and new possible left sided infarct.  Neurology was reconsulted: EEG negative.  Recommended to avoid episodes of hypotension.  Assessment & Plan:   Acute/subacute CVA with extension -MRI findings and discussion as above.  Neurology had already signed off. -A1c 10.2.  LDL 111.  Echo showed preserved EF with negative bubble study. 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 -Apparently, when blood pressure drops, left upper extremity becomes weaker and symptoms improve when blood  pressure improves. -Patient apparently had worsening left sided weakness and mild new right-sided weakness on 05/28/2021.  MRI of brain done on 05/29/2021 showed progression of right ACA territory infarcts and new possible left-sided infarct.  Patient was evaluated by Dr. Sethi/neurology: EEG was negative for active seizures.  Neurology recommended to avoid episodes of hypotension and to keep systolic blood pressure in the 130s to 150s range.  Continue aspirin and Plavix for 3 weeks followed by Plavix alone.  Hypertension with recurrent episodes of hypotension -Patient has had episodes of recent hypotension and subsequent elevated blood pressure as well. -Neurology recommended to avoid hypotension.  She has been started on midodrine.  She was also put on IV fluids. -Coreg was discontinued on 05/29/2021.  Subsequently blood pressure has remained on the higher side reaching up to 170s.  Will DC IV fluids and midodrine today.  Will monitor blood pressure readings for next 24 hours prior to discharging her to SNF.  Spoke to daughter/Alisa on phone today and she is agreeable with the current plan and is hoping that her mother can be discharged to SNF tomorrow.  Chronic diastolic CHF -Strict input and output.  Daily weights.  Patient received few doses of IV Lasix and diuresed well and subsequently Lasix was stopped because of episodes of hypotension.  COVID-19 infection  -Tested positive on 05/19/2021. -subsequently treated with paxlovid and 10-day quarantine.   -Currently on room air.  DC'd isolation on 05/29/2021.  Hypokalemia -Replace.  Repeat a.m. labs  Hypomagnesemia -Replace.  Repeat a.m. labs  Diabetes mellitus type 2 with hyperglycemia -Continue CBGs with SSI  Dementia -Continue Namenda.  Fall precautions.  Hypothyroidism-continue levothyroxine  Hyperlipidemia Continue rosuvastatin  Acute lower UTI  Acute urinary retention -Urine culture negative.  Completed course of IV Rocephin.   Currently has Foley catheter.  She has been started on suppressive nitrofurantoin.    Generalized deconditioning Frequent falls -PT recommending SNF placement.  Social worker following. -Overall prognosis is guarded to poor.  Could benefit from palliative care consultation as an outpatient  DVT prophylaxis: Lovenox Code Status: Full Family Communication: Daughter on phone on 05/30/2021  disposition Plan: Status is: Inpatient  Remains inpatient appropriate because: Of severity of illness.  Will need SNF placement.  Possibly tomorrow if blood pressures remain stable today.  Consultants: Neurology/cardiology  Procedures: Echo/loop recorder placement  Antimicrobials:  Anti-infectives (From admission, onward)    Start     Dose/Rate Route Frequency Ordered Stop   05/25/21 2300  nitrofurantoin (macrocrystal-monohydrate) (MACROBID) capsule 100 mg        100 mg Oral Daily at bedtime 05/25/21 2202     05/25/21 2200  nitrofurantoin (MACRODANTIN) capsule 100 mg  Status:  Discontinued        100 mg Oral Daily at bedtime 05/25/21 1404 05/25/21 2202   05/20/21 1000  remdesivir 100 mg in sodium chloride 0.9 % 100 mL IVPB  Status:  Discontinued       See Hyperspace for full Linked Orders Report.   100 mg 200 mL/hr over 30 Minutes Intravenous Daily 05/19/21 1257 05/19/21 1308   05/19/21 1400  nirmatrelvir/ritonavir EUA (renal dosing) (PAXLOVID) 2 tablet        2 tablet Oral 2 times daily 05/19/21 1308 05/24/21 0216   05/19/21 1345  remdesivir 200 mg in sodium chloride 0.9% 250 mL IVPB  Status:  Discontinued       See Hyperspace for full Linked Orders Report.   200 mg 580 mL/hr over 30 Minutes Intravenous Once 05/19/21 1257 05/19/21 1308   05/18/21 0900  cefTRIAXone (ROCEPHIN) 1 g in sodium chloride 0.9 % 100 mL IVPB  Status:  Discontinued        1 g 200 mL/hr over 30 Minutes Intravenous Every 24 hours 05/18/21 0810 05/22/21 0851        Subjective: Patient seen and examined at bedside.  Poor  historian.  Awake, still slow to respond but intermittently smiling.  No overnight fever, vomiting, seizures reported.   Objective: Vitals:   05/29/21 2354 05/30/21 0358 05/30/21 0615 05/30/21 0720  BP: (!) 171/98 (!) 171/85 140/70 (!) 157/92  Pulse: 89 89 67 92  Resp: 20 20  18   Temp: 98.2 F (36.8 C) 98.3 F (36.8 C)  98.6 F (37 C)  TempSrc: Axillary Oral  Oral  SpO2: 98% 99%  99%  Weight:      Height:        Intake/Output Summary (Last 24 hours) at 05/30/2021 1106 Last data filed at 05/30/2021 1040 Gross per 24 hour  Intake 1357.65 ml  Output 2075 ml  Net -717.35 ml    Filed Weights   05/13/21 1941  Weight: 61 kg    Examination:  General exam: No distress.  On room air currently.  Looks chronically ill and deconditioned.   Respiratory system: Decreased breath sounds at bases bilaterally with some scattered crackles Cardiovascular system: Currently rate controlled; S1-S2 heard Gastrointestinal system: Abdomen is distended slightly; soft and nontender.  Bowel sounds are heard Extremities: Mild lower extremity edema present; no cyanosis  Central nervous system: More awake; still very slow to respond; slightly confused to time.  No focal neurological deficits.  Left-sided weakness present  skin: No obvious ecchymosis/rashes  Psychiatry: Flat affect.  Poor historian.  Data Reviewed: I have personally reviewed following labs and imaging studies  CBC: Recent Labs  Lab 05/24/21 1331 05/25/21 0238 05/29/21 0214 05/30/21 0155  WBC 3.5* 3.6* 6.4 6.6  NEUTROABS  --   --   --  5.0  HGB 12.2 11.2* 10.3* 11.1*  HCT 36.8 34.4* 31.7* 35.0*  MCV 77.5* 78.0* 79.8* 79.2*  PLT 264 280 308 [id number]    Basic Metabolic Panel: Recent Labs  Lab 05/24/21 1331 05/25/21 0238 05/29/21 0214 05/30/21 0155  NA 136 140 142 138  K 3.8 4.0 3.3* 2.9*  CL 98 105 104 101  CO2 24 22 28 25   GLUCOSE 168* 152* 133* 150*  BUN 34* 30* 15 8  CREATININE 1.48* 1.05* 0.72 0.64  CALCIUM 8.7* 8.6*  8.4* 8.2*  MG  --   --   --  1.6*    GFR: Estimated Creatinine Clearance: 46.6 mL/min (by C-G formula based on SCr of 0.64 mg/dL). Liver Function Tests: Recent Labs  Lab 05/25/21 0238  AST 55*  ALT 46*  ALKPHOS 66  BILITOT 1.1  PROT 5.9*  ALBUMIN 3.1*    No results for input(s): LIPASE, AMYLASE in the last 168 hours. No results for input(s): AMMONIA in the last 168 hours. Coagulation Profile: No results for input(s): INR, PROTIME in the last 168 hours. Cardiac Enzymes: No results for input(s): CKTOTAL, CKMB, CKMBINDEX, TROPONINI in the last 168 hours. BNP (last 3 results) No results for input(s): PROBNP in the last 8760 hours. HbA1C: No results for input(s): HGBA1C in the last 72 hours. CBG: Recent Labs  Lab 05/29/21 0824 05/29/21 1148 05/29/21 1550 05/29/21 2117 05/30/21 0608  GLUCAP 133* 237* 164* 117* 145*    Lipid Profile: No results for input(s): CHOL, HDL, LDLCALC, TRIG, CHOLHDL, LDLDIRECT in the last 72 hours. Thyroid Function Tests: No results for input(s): TSH, T4TOTAL, FREET4, T3FREE, THYROIDAB in the last 72 hours. Anemia Panel: No results for input(s): VITAMINB12, FOLATE, FERRITIN, TIBC, IRON, RETICCTPCT in the last 72 hours. Sepsis Labs: No results for input(s): PROCALCITON, LATICACIDVEN in the last 168 hours.  No results found for this or any previous visit (from the past 240 hour(s)).       Radiology Studies: MR BRAIN WO CONTRAST  Result Date: 05/29/2021 CLINICAL DATA:  Increased confusion. EXAM: MRI HEAD WITHOUT CONTRAST TECHNIQUE: Multiplanar, multiecho pulse sequences of the brain and surrounding structures were obtained without intravenous contrast. COMPARISON:  CT head without contrast 05/24/2021. MR head without contrast 0/28/22. CTA head and neck 05/20/2021 FINDINGS: Brain: Diffusion-weighted images demonstrate progression of the right ACA territory infarcts the on the cingulate gyrus. There is now involvement the medial right frontal area  and superior frontal gyrus. A focus of restricted diffusion adjacent to the atrium of the right lateral ventricle is relatively stable measuring 12 mm. A new punctate focus of restricted diffusion is present in the lateral left hippocampus, evident on image 65 of series 5 and image 47 of series 7. T2 signal changes are noted within the areas of restricted diffusion. Remote lacunar infarct in the left caudate head is again noted. Dilated perivascular spaces are present throughout the basal ganglia. White matter changes extend into the brainstem are stable. Vascular: Flow is present in the major intracranial arteries. Skull and upper cervical spine: The craniocervical junction is normal. Upper cervical spine is within normal limits. Marrow signal is unremarkable. Sinuses/Orbits: A polyp or retention cyst is present in the right maxillary sinus. Mucosal  thickening is present the anterior ethmoid air cells bilaterally. Mild mucosal thickening is present in the right sphenoid sinus. Small mastoid effusions are present. No obstructing nasopharyngeal lesion is present. Bilateral lens replacements are noted. Globes and orbits are otherwise unremarkable. IMPRESSION: 1. Progression of right ACA territory infarcts involving the cingulate gyrus and medial right frontal area. 2. New punctate focus of restricted diffusion in the lateral left hippocampus. This may represent a small infarct. 3. Stable atrophy and white matter disease. 4. Remote lacunar infarct of the left caudate head. These results were called by telephone at the time of interpretation on 05/29/2021 at 11:05am to provider K. Starla Link, who verbally acknowledged these results. Electronically Signed   By: San Morelle M.D.   On: 05/29/2021 11:11   EEG adult  Result Date: 05/29/2021 Lora Havens, MD     05/29/2021  2:57 PM Patient Name: RACHNA EPPERSON MRN: [id number] Epilepsy Attending: Lora Havens Referring Physician/Provider: Dr Antony Contras Date:  05/29/2021 Duration: 22.06 mins Patient history: 81yo F with ams. EEG to evaluate for seizure Level of alertness: Awake AEDs during EEG study: None Technical aspects: This EEG study was done with scalp electrodes positioned according to the 10-20 International system of electrode placement. Electrical activity was acquired at a sampling rate of 500Hz  and reviewed with a high frequency filter of 70Hz  and a low frequency filter of 1Hz . EEG data were recorded continuously and digitally stored. Description: EEG showed continuous low amplitude 3-5hz  theta-delta slowing in right hemisphere admixed with sharp transients in right posterior quadrant. There is also 5 to 7 Hz theta slowing in left hemisphere admixed with intermittent 2-3hz  delta slowing. Hyperventilation and photic stimulation were not performed.   ABNORMALITY - Continuous slow, generalized and lateralized right hemisphere IMPRESSION: This study is suggestive of cortical dysfunction arising from right hemisphere likely secondary to underlying structural abnormality. Additionally there is moderate diffuse encephalopathy, nonspecific etiology. No seizures or definite epileptiform discharges were seen throughout the recording. If suspicion for ictal- interictal activity remains a concern, a prolonged study can be considered. Priyanka Barbra Sarks        Scheduled Meds:  aspirin EC  325 mg Oral Daily   Chlorhexidine Gluconate Cloth  6 each Topical Daily   clopidogrel  75 mg Oral Daily   enoxaparin (LOVENOX) injection  40 mg Subcutaneous QHS   feeding supplement (GLUCERNA SHAKE)  237 mL Oral TID BM   insulin aspart  0-6 Units Subcutaneous TID AC & HS   levothyroxine  100 mcg Oral Q0600   lip balm   Topical BID   melatonin  3 mg Oral QHS   multivitamin with minerals  1 tablet Oral Daily   nitrofurantoin (macrocrystal-monohydrate)  100 mg Oral QHS   potassium chloride  40 mEq Oral Q4H   rosuvastatin  5 mg Oral Daily   Continuous  Infusions:          Aline August, MD [facility] 05/30/2021, 11:06 AM

## 2021-05-30 NOTE — Progress Notes (Signed)
Occupational Therapy Treatment Patient Details Name: Merrilee Seashorenita S [place] MRN: [id number] DOB: 03/20/1941 Today's Date: 05/30/2021   History of present illness Merrilee Seashorenita S [place]keefe is a 81 y.o. female who presented with increased confusion and lethargy x 1 day along with decline in function over the last couple weeks. 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 CT head 12/20 showing stable size of known subacute white matter infarct. 05/28/21 MRI showed progression of right ACA infarction. Head CT ordered given changes in mental status, awaiting results. PMH significant for DM2, HOCM, HTN, hypothyroidism, memory changes.   OT comments  Synetta Failnita is incrementally progressing, goals updated, see care plan. Pt was more alert this session, following commands and engaging well. Overall pt was max A for bed mobility but initiating task with her R side well, and fluctuating from min-max A for sitting balance. Pt demonstrated decreased movement of LUE this session and maintained flexed posturing throughout. PROM (see exercises below) completed to pt's tolerance with some active resistance. Pt may need to be assessed for splinting needs of LUE. D/c plan remains appropriate, OT to continue to follow acutely.    Recommendations for follow up therapy are one component of a multi-disciplinary discharge planning process, led by the attending physician.  Recommendations may be updated based on patient status, additional functional criteria and insurance authorization.    Follow Up Recommendations  Skilled nursing-short term rehab (<3 hours/day)    Assistance Recommended at Discharge Frequent or constant Supervision/Assistance  Patient can return home with the following  A lot of help with walking and/or transfers;Two people to help with walking and/or transfers;A lot of help with bathing/dressing/bathroom;Two people to help with bathing/dressing/bathroom;Assistance with cooking/housework;Assistance with feeding;Help with  stairs or ramp for entrance   Equipment Recommendations  Other (comment) (defer to next venue)       Precautions / Restrictions Precautions Precautions: Fall Restrictions Weight Bearing Restrictions: No       Mobility Bed Mobility Overal bed mobility: Needs Assistance Bed Mobility: Supine to Sit;Sit to Supine     Supine to sit: Max assist Sit to supine: Max assist   General bed mobility comments: assist for all aspects of the task, pt initiating movement of R side, not of L    Transfers Overall transfer level: Needs assistance                 General transfer comment: focused on bed level this session     Balance Overall balance assessment: Needs assistance;History of Falls Sitting-balance support: Feet supported Sitting balance-Leahy Scale: Poor Sitting balance - Comments: required external support                                   ADL either performed or assessed with clinical judgement   ADL Overall ADL's : Needs assistance/impaired     Grooming: Maximal assistance;Sitting;Wash/dry face Grooming Details (indicate cue type and reason): does well with use of RUE - resisting attempt to hand over hand with LUE. max A for balance in sitting                             Functional mobility during ADLs: Maximal assistance (limited to bed this session) General ADL Comments: pt required min-max asssit for sitting balance. 1 LOB posteriorly otherwise dense L lateral lean    Extremity/Trunk Assessment Upper Extremity Assessment RUE Deficits / Details: strength  is generally 4/5 RUE Coordination: decreased fine motor LUE Deficits / Details: flexed posturing of hand and elbow, trace movement noted with functioal tasks otherwise pt not moving funcitonally & resisting PROM - may need palm guard if pt does not start moving it more LUE Sensation: decreased light touch;decreased proprioception LUE Coordination: decreased fine motor;decreased  gross motor   Lower Extremity Assessment Lower Extremity Assessment: Defer to PT evaluation        Vision   Vision Assessment?: No apparent visual deficits   Perception     Praxis      Cognition Arousal/Alertness: Awake/alert Behavior During Therapy: Flat affect Overall Cognitive Status: History of cognitive impairments - at baseline                                 General Comments: pt more alert this session, follows simple 1 step commands. required physical assist to initiate funcitonal grooming tasks, LUE movement or compensatory techniques          Exercises Other Exercises Other Exercises: static sitting with focus on midline posture Other Exercises: PROM of L shoulder to pt's tolerance Other Exercises: PROM of L elbow to pts tolerance Other Exercises: PROM of L wrist Other Exercises: PROM of L digits   Shoulder Instructions       General Comments VSS on RA    Pertinent Vitals/ Pain       Pain Assessment: Faces Faces Pain Scale: Hurts a little bit Breathing: normal Negative Vocalization: occasional moan/groan, low speech, negative/disapproving quality Facial Expression: sad, frightened, frown Body Language: relaxed Consolability: no need to console PAINAD Score: 2 Pain Location: Back & LUE Pain Descriptors / Indicators: Grimacing;Sore Pain Intervention(s): Limited activity within patient's tolerance;Monitored during session   Frequency  Min 2X/week        Progress Toward Goals  OT Goals(current goals can now be found in the care plan section)  Progress towards OT goals: Progressing toward goals  Acute Rehab OT Goals Patient Stated Goal: did not state OT Goal Formulation: With patient Time For Goal Achievement: 06/11/21 Potential to Achieve Goals: Good ADL Goals Pt Will Perform Grooming: with modified independence;standing Pt Will Perform Lower Body Bathing: with modified independence;sit to/from stand Pt Will Perform Lower  Body Dressing: with modified independence;sit to/from stand Pt Will Transfer to Toilet: with modified independence;ambulating Additional ADL Goal #1: Pt will indep verbalize at least 3 fall prevention strategies to apply in the home setting  Plan Discharge plan remains appropriate       AM-PAC OT "6 Clicks" Daily Activity     Outcome Measure   Help from another person eating meals?: A Little Help from another person taking care of personal grooming?: A Lot Help from another person toileting, which includes using toliet, bedpan, or urinal?: A Lot Help from another person bathing (including washing, rinsing, drying)?: Total Help from another person to put on and taking off regular upper body clothing?: A Lot Help from another person to put on and taking off regular lower body clothing?: A Lot 6 Click Score: 12    End of Session    OT Visit Diagnosis: Unsteadiness on feet (R26.81);Other abnormalities of gait and mobility (R26.89);Repeated falls (R29.6);Muscle weakness (generalized) (M62.81);History of falling (Z91.81);Pain;Hemiplegia and hemiparesis Hemiplegia - Right/Left: Left Hemiplegia - dominant/non-dominant: Non-Dominant Hemiplegia - caused by: Cerebral infarction   Activity Tolerance Patient tolerated treatment well   Patient Left in bed;with call bell/phone within reach;with  bed alarm set   Nurse Communication Mobility status        Time: 7672-0947 OT Time Calculation (min): 16 min  Charges: OT General Charges $OT Visit: 1 Visit OT Treatments $Therapeutic Activity: 8-22 mins   Rayonna Heldman A Haeley [place] 05/30/2021, 4:20 PM

## 2021-05-31 ENCOUNTER — Ambulatory Visit: Payer: Medicare Other

## 2021-05-31 DIAGNOSIS — R531 Weakness: Secondary | ICD-10-CM | POA: Diagnosis not present

## 2021-05-31 DIAGNOSIS — I63521 Cerebral infarction due to unspecified occlusion or stenosis of right anterior cerebral artery: Secondary | ICD-10-CM | POA: Diagnosis not present

## 2021-05-31 DIAGNOSIS — Z7189 Other specified counseling: Secondary | ICD-10-CM

## 2021-05-31 DIAGNOSIS — Z515 Encounter for palliative care: Secondary | ICD-10-CM

## 2021-05-31 DIAGNOSIS — R638 Other symptoms and signs concerning food and fluid intake: Secondary | ICD-10-CM | POA: Diagnosis not present

## 2021-05-31 LAB — GLUCOSE, CAPILLARY
Glucose-Capillary: 157 mg/dL — ABNORMAL HIGH (ref 70–99)
Glucose-Capillary: 160 mg/dL — ABNORMAL HIGH (ref 70–99)

## 2021-05-31 LAB — BASIC METABOLIC PANEL
Anion gap: 11 (ref 5–15)
BUN: 8 mg/dL (ref 8–23)
CO2: 26 mmol/L (ref 22–32)
Calcium: 8.7 mg/dL — ABNORMAL LOW (ref 8.9–10.3)
Chloride: 102 mmol/L (ref 98–111)
Creatinine, Ser: 0.65 mg/dL (ref 0.44–1.00)
GFR, Estimated: >60 mL/min (ref >60)
Glucose, Bld: 140 mg/dL — ABNORMAL HIGH (ref 70–99)
Potassium: 3.4 mmol/L — ABNORMAL LOW (ref 3.5–5.1)
Sodium: 139 mmol/L (ref 135–145)

## 2021-05-31 LAB — MAGNESIUM: Magnesium: 1.9 mg/dL (ref 1.7–2.4)

## 2021-05-31 MED ORDER — ACETAMINOPHEN 325 MG PO TABS: 650.0000 mg | ORAL_TABLET | Freq: Four times a day (QID) | ORAL | Status: AC | PRN

## 2021-05-31 MED ORDER — POLYETHYLENE GLYCOL 3350 17 G PO PACK: 17.0000 g | PACK | Freq: Every day | ORAL | 0 refills | Status: AC | PRN

## 2021-05-31 MED ORDER — HYDRALAZINE HCL 25 MG PO TABS: 25.0000 mg | ORAL_TABLET | Freq: Three times a day (TID) | ORAL | 1 refills | Status: AC | PRN

## 2021-05-31 MED ORDER — ASPIRIN 325 MG PO TBEC
325.0000 mg | DELAYED_RELEASE_TABLET | Freq: Every day | ORAL | 0 refills | Status: AC
Start: 2021-05-31 — End: 2021-06-21

## 2021-05-31 MED ORDER — NITROFURANTOIN MONOHYD MACRO 100 MG PO CAPS: 100.0000 mg | ORAL_CAPSULE | Freq: Every day | ORAL | Status: AC

## 2021-05-31 MED ORDER — GLUCERNA SHAKE PO LIQD: 237.0000 mL | Freq: Three times a day (TID) | ORAL | 0 refills | Status: AC

## 2021-05-31 NOTE — Consult Note (Signed)
Consultation Note Date: 05/31/2021   Patient Name: Tina [name]  DOB: [birth year]  MRN: [id number]  Age / Sex: 81 y.o., female  PCP: Leonard Downing, MD Referring Physician: Bonnielee Haff, MD  Reason for Consultation: Establishing goals of care and Psychosocial/spiritual support  HPI/Patient Profile:   81 year old female with history of diabetes mellitus type 2, HOCM, hypertension, hypothyroidism, dementia presented on 05/13/2021 with increasing confusion with functional decline for the past few weeks.  Recent MRI noted late acute right frontal parasagittal infarct.  CT angiogram showed evolving subacute infarct, but no acute finding.  She underwent stroke work-up.  Hospital stay complicated by UTI.  PT recommended SNF placement.  She tested positive for COVID on 05/19/2021; subsequently treated with paxlovid and 10-day quarantine.     Hospital course also complicated with episodes of sundowning and hypotension.  Repeat MRI of the brain showed progression of stroke.  Neurology recommended to avoid episodes of hypotension.   Patient had repeat MRI of brain done on 05/29/2021 for worsening left sided weakness and some right-sided weakness which showed progression of right ICA territory infarcts and new possible left sided infarct.  Neurology was reconsulted: EEG negative.    Patient has high risk for decompensation.  Family face treatment option decision  Palliative medicine consulted to help explore goals of care with family.     Clinical Assessment and Goals of Care:  This NP Wadie Lessen reviewed medical records, received report from team, assessed the patient and then meet at the patient's bedside along with her daughter/Tina [name]  to discuss diagnosis, prognosis, GOC, EOL wishes disposition and options.   Concept of Palliative Care was introduced as specialized medical care for people and their  families living with serious illness.  If focuses on providing relief from the symptoms and stress of a serious illness.  The goal is to improve quality of life for both the patient and the family.  Values and goals of care important to patient and family were attempted to be elicited.  Education offered today regarding advanced directives.  Concepts specific to code status, artifical feeding and hydration, continued IV antibiotics and rehospitalization was had.    The difference between an aggressive medical intervention path  and a palliative comfort care path for this patient at this time in this situation was had.     Education on MOST form offered, reviewed form with daughter,  hard copy left for reference along with a Hard Choices booklet   Questions and concerns addressed.  Patient  encouraged to call with questions or concerns.     PMT will continue to support holistically.           No documented healthcare power of attorney.  Patient has a living spouse and 2 living children Daughter tells me that she is the main support person for both her mother and father.      SUMMARY OF RECOMMENDATIONS    Code Status/Advance Care Planning: Full code Educated family to consider DNR/DNI status understanding evidenced based poor  outcomes in similar hospitalized patient, as the cause of arrest is likely associated with advanced chronic illness rather than an easily reversible acute cardio-pulmonary event.    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 The following aspects are pertinent.  Past Medical History:  Diagnosis Date   Carotid artery disease ([place])    a. Carotid US 7/15 - bilat ICA 40-59% >> FU 1 year //  b. Carotid US 10/17: bilat ICA 1-39% >> FU PRN (will get repeat in 2 years)    Diabetes mellitus    Heart murmur    History of nuclear stress test    a. Myoview 6/15 - Normal stress nuclear study. LV Ejection Fraction: 77%   HOCM (hypertrophic obstructive cardiomyopathy) ([place])    a. Echo 7/15 - Mild concentric LVH, vigorous LVF, EF 65-70%, LVOT dynamic obstruction at rest with peak gradient 20 mmHg, grade 1 diastolic dysfunction, mild SAM, mild to moderate MR // b. Echo 10/17: EF 55-60%, normal wall motion, grade 2 diastolic dysfunction, MAC, mild to moderate MR, mild LAE (LVOT 9 mmHg)   Hyperlipidemia    Hypertension    Hypothyroidism    Memory changes    Mitral regurgitation    Social History   Socioeconomic History   Marital status: Married    Spouse name: Not on file   Number of children: Not on file   Years of education: Not on file   Highest education level: Not on file  Occupational History   Not on file  Tobacco Use   Smoking status: Never   Smokeless tobacco: Never  Vaping Use   Vaping Use: Never used  Substance and Sexual Activity   Alcohol use: No   Drug use: No   Sexual activity: Not Currently  Other Topics Concern   Not on file  Social History Narrative   Not on file   Social Determinants of Health    Financial Resource Strain: Not on file  Food Insecurity: Not on file  Transportation Needs: Not on file  Physical Activity: Not on file  Stress: Not on file  Social Connections: Not on file   Family History  Problem Relation Age of Onset   Heart disease Mother    Healthy Father    Breast cancer Neg Hx    Scheduled Meds:  aspirin EC  325 mg Oral Daily   Chlorhexidine Gluconate Cloth  6 each Topical Daily   clopidogrel  75 mg Oral Daily   enoxaparin (LOVENOX) injection  40 mg Subcutaneous QHS   feeding supplement (GLUCERNA SHAKE)  237 mL Oral TID BM   insulin aspart  0-6 Units Subcutaneous TID AC & HS   levothyroxine  100 mcg Oral Q0600  lip balm   Topical BID   melatonin  3 mg Oral QHS   multivitamin with minerals  1 tablet Oral Daily   nitrofurantoin (macrocrystal-monohydrate)  100 mg Oral QHS   rosuvastatin  5 mg Oral Daily   senna-docusate  1 tablet Oral BID   Continuous Infusions: PRN Meds:.acetaminophen **OR** acetaminophen, bisacodyl, cyclobenzaprine, haloperidol lactate, ondansetron **OR** ondansetron (ZOFRAN) IV, phenol, polyethylene glycol, traMADol Medications Prior to Admission:  Prior to Admission medications   Medication Sig Start Date End Date Taking? Authorizing Provider  clopidogrel (PLAVIX) 75 MG tablet Take 1 tablet (75 mg total) by mouth daily. 05/10/21  Yes Genia Harold, MD  glipiZIDE (GLUCOTROL XL) 10 MG 24 hr tablet Take 20 mg by mouth every morning. 04/20/20  Yes [provider]  losartan (COZAAR) 100 MG tablet Take 100 mg by mouth at bedtime.   Yes [provider]  pioglitazone (ACTOS) 30 MG tablet Take 30 mg by mouth every morning. 05/09/21  Yes [provider]  SYNTHROID 100 MCG tablet Take 1 tablet (100 mcg total) by mouth daily. Patient taking differently: Take 100 mcg by mouth every morning. 08/10/16 06/27/21 Yes Dorothy Spark, MD  aspirin 81 MG EC tablet Take 1 tablet (81 mg total) by mouth every morning for 21  days. 05/15/21 06/05/21  Terrilee Croak, MD  donepezil (ARICEPT) 10 MG tablet Take 10 mg by mouth 2 (two) times daily. Patient not taking: Reported on 05/13/2021 02/15/21   [provider]  ezetimibe (ZETIA) 10 MG tablet Take 1 tablet (10 mg total) by mouth daily. Patient not taking: Reported on 05/13/2021 05/12/21   Genia Harold, MD  insulin aspart (NOVOLOG) 100 UNIT/ML injection Inject 0-6 Units into the skin 4 (four) times daily -  before meals and at bedtime. 05/17/21   Dahal, Marlowe Aschoff, MD  melatonin 3 MG TABS tablet Take 1 tablet (3 mg total) by mouth at bedtime. 05/15/21 08/13/21  Terrilee Croak, MD  memantine (NAMENDA) 10 MG tablet Take 1/2 tab at night for one week. Then take 1/2 tab in the morning and night for one week. Then take 1/2 tab in the morning and 1 tab at night for one week. Then take 1 tab twice a day Patient not taking: Reported on 05/13/2021 04/18/21   Genia Harold, MD  rosuvastatin (CRESTOR) 5 MG tablet Take 1 tablet (5 mg total) by mouth daily. 05/16/21 08/14/21  Terrilee Croak, MD  tamsulosin (FLOMAX) 0.4 MG CAPS capsule Take 1 capsule (0.4 mg total) by mouth daily. 05/18/21   Terrilee Croak, MD   Allergies  Allergen Reactions   Carvedilol Other (See Comments)    Dizziness   Lipitor [Atorvastatin Calcium] Other (See Comments)    Pt doesn't recall   Loratadine Other (See Comments)    Unknown reaction   Metformin And Related Other (See Comments)    Unknown reaction   Pravachol Other (See Comments)    Unknown reaction   Zocor [Simvastatin] Other (See Comments)    Unknown reaction   Metoprolol Other (See Comments)    Pt states metoprolol made her "very tired."   Review of Systems  Unable to perform ROS: Mental status change   Physical Exam  Vital Signs: BP (!) 177/90 (BP Location: Right Arm)    Pulse 63    Temp 98.8 F (37.1 C) (Axillary)    Resp 17    Ht _0  (1.575 m)    Wt 61 kg    SpO2 98%    BMI 24.60  kg/m  Pain Scale: 0-10 POSS *See Group  Information*: 1-Acceptable,Awake and alert Pain Score: 0-No pain   SpO2: SpO2: 98 % O2 Device:SpO2: 98 % O2 Flow Rate: .   IO: Intake/output summary:  Intake/Output Summary (Last 24 hours) at 05/31/2021 0837 Last data filed at 05/31/2021 0600 Gross per 24 hour  Intake 1000 ml  Output 1000 ml  Net 0 ml    LBM: Last BM Date: 05/22/21 Baseline Weight: Weight: 61 kg Most recent weight: Weight: 61 kg     Palliative Assessment/Data: 30 % at best   Discussed with Dr Maryland Pink  Patient to be discharged today to skilled nursing facility for rehab.  Highly recommend outpatient palliative services to follow.  Patient is high risk for decompensation and family will need ongoing support navigating healthcare decisions and advance care planning.    Signed by: Wadie Lessen, NP   Please contact Palliative Medicine Team phone at [phone] for questions and concerns.  For individual provider: See Shea Evans

## 2021-05-31 NOTE — TOC Transition Note (Signed)
Transition of Care [facility]) - CM/SW Discharge Note   Patient Details  Name: Tina [name] MRN: [id number] Date of Birth: 01-Nov-1940  [facility] CM/SW Contact:  Kermit Balo, RN Phone Number: 05/31/2021, 11:28 AM   Clinical Narrative:    Daughter is discharging to [facility] today. [facility] providing transport. Daughter at he bedside and aware. Bedside RN updated and discharge packet is at the desk.   Room: 101 Number for report: [phone]   Final next level of care: Skilled Nursing Facility Barriers to Discharge: No Barriers Identified   Patient Goals and CMS Choice Patient states their goals for this hospitalization and ongoing recovery are:: return home CMS Medicare.gov Compare Post Acute Care list provided to:: Patient Represenative (must comment) Choice offered to / list presented to : Adult Children  Discharge Placement              Patient chooses bed at: [facility] [place] Patient to be transferred to facility by: [place] Lao Name of family member notified: Daughter Patient and family notified of of transfer: 05/31/21  Discharge Plan and Services   Discharge Planning Services: CM Consult Post Acute Care Choice: Skilled Nursing Facility                               Social Determinants of Health (SDOH) Interventions     Readmission Risk Interventions No flowsheet data found.

## 2021-05-31 NOTE — Discharge Summary (Addendum)
[facility]  Physician Discharge Summary   Patient ID: MAEBRY MITNICK MRN: [id number] DOB/AGE: 05-29-1940 81 y.o.  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  If blood pressure remains consistently greater than 160 please initiate antihypertensives such as low-dose amlodipine. Please monitor CBGs Patient will need a voiding trial once she is more mobile Patient to be seen by Dr. Curt Bears with EP for loop recorder f/u. Please call to arrange appointment. Patient to be seen by Dr. Acie Fredrickson with cardiology for CHF f/u. Please call to arrange appointment.   Home Health: Going to SNF Equipment/Devices: None  CODE STATUS: Full code  DISCHARGE CONDITION: fair  Diet recommendation: Heart healthy diet with thin liquid  INITIAL HISTORY: 81 year old female with history of diabetes mellitus type 2, HOCM, hypertension, hypothyroidism, dementia presented on 05/13/2021 with increasing confusion with functional decline for the past few weeks.  Recent MRI noted late acute right frontal parasagittal infarct.  CT angiogram showed evolving subacute infarct, but no acute finding.  She underwent stroke work-up.  Hospital stay complicated by UTI.  PT recommended SNF placement.  She tested positive for COVID on 05/19/2021; subsequently treated with paxlovid and 10-day quarantine.     Hospital course also complicated with episodes of sundowning and hypotension.  Repeat MRI of the brain showed progression of stroke.  Neurology recommended to avoid episodes of hypotension.  Patient was started on midodrine and IV  fluids.  Apparently, when blood pressure drops, left upper extremity becomes weaker and symptoms improve when blood pressure improves.   Patient had repeat MRI of brain done on 05/29/2021 for worsening left sided weakness and some right-sided weakness which showed progression of right ICA territory infarcts and new possible left sided infarct.  Neurology was reconsulted: EEG negative.  Recommended to avoid episodes of hypotension.  Consultations: Neurology Cardiology  Procedures: Echocardiogram Loop recorder placement   HOSPITAL COURSE:   Acute/subacute CVA with extension -A1c 10.2.  LDL 111.  Echo showed preserved EF with negative bubble study. 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 -Apparently, when blood pressure drops, left upper extremity becomes weaker and symptoms improve when blood pressure improves. -Patient apparently had worsening left sided weakness and mild new right-sided weakness on 05/28/2021.  MRI of brain done on 05/29/2021 showed progression of right ACA territory infarcts and new possible left-sided infarct.  Patient was evaluated by Dr. Sethi/neurology: EEG was negative for active seizures.  Neurology recommended to avoid episodes of hypotension and to keep systolic blood pressure in the 130s to 150s range.  Continue aspirin and Plavix for 3 weeks followed by Plavix alone.   Essential hypertension with recurrent episodes of hypotension -Patient has had episodes of recent hypotension and subsequent elevated blood pressure as well. -Neurology recommended to avoid hypotension.  Patient was initially placed on midodrine and given IV fluids.  Carvedilol was discontinued. Patient subsequently became hypotensive.  Midodrine was subsequently discontinued. Will tolerate high blood pressure in this patient.  We will give her  hydralazine as needed for blood pressures greater than 0000000 systolic.  If patient  requires multiple doses of hydralazine and if her blood pressure consistently remains greater than 160 then would recommend initiating low-dose amlodipine, 2.5 mg once a day.   Chronic diastolic CHF Patient received few doses of IV Lasix and diuresed well and subsequently Lasix was stopped because of episodes of hypotension.   COVID-19 infection, incidental -Tested positive on 05/19/2021. -subsequently treated with paxlovid and 10-day quarantine.   -Currently on room air.  DC'd isolation on 05/29/2021.   Hypokalemia/hypomagnesemia Improved.  Will be given additional dose today prior to discharge.  Magnesium is 1.9.   Diabetes mellitus type 2 with hyperglycemia Monitor CBGs.  Continue home medications.   Dementia Stable.  Continue home medications  Hypothyroidism Continue levothyroxine  Hyperlipidemia Continue rosuvastatin  Acute lower UTI/Acute urinary retention -Urine culture negative.  Completed course of IV Rocephin.   Currently has Foley catheter.  She has been started on suppressive nitrofurantoin.  Also on Flomax.  Voiding trial once patient is more ambulatory.   Generalized deconditioning/Frequent falls Going to SNF for rehabilitation  Patient is stable.  Okay for discharge to SNF today.    PERTINENT LABS:  The results of significant diagnostics from this hospitalization (including imaging, microbiology, ancillary and laboratory) are listed below for reference.    Labs:  COVID-19 Labs   Lab Results  Component Value Date   SARSCOV2NAA POSITIVE (A) 05/19/2021   [place] NEGATIVE 05/16/2021   [place] NEGATIVE 05/13/2021      Basic Metabolic Panel: Recent Labs  Lab 05/24/21 1331 05/25/21 0238 05/29/21 0214 05/30/21 0155 05/31/21 0217  NA 136 140 142 138 139  K 3.8 4.0 3.3* 2.9* 3.4*  CL 98 105 104 101 102  CO2 24 22 28 25 26   GLUCOSE 168* 152* 133* 150* 140*  BUN  34* 30* 15 8 8   CREATININE 1.48* 1.05* 0.72 0.64 0.65  CALCIUM 8.7* 8.6* 8.4* 8.2* 8.7*  MG  --   --   --  1.6* 1.9   Liver Function Tests: Recent Labs  Lab 05/25/21 0238  AST 55*  ALT 46*  ALKPHOS 66  BILITOT 1.1  PROT 5.9*  ALBUMIN 3.1*    CBC: Recent Labs  Lab 05/24/21 1331 05/25/21 0238 05/29/21 0214 05/30/21 0155  WBC 3.5* 3.6* 6.4 6.6  NEUTROABS  --   --   --  5.0  HGB 12.2 11.2* 10.3* 11.1*  HCT 36.8 34.4* 31.7* 35.0*  MCV 77.5* 78.0* 79.8* 79.2*  PLT 264 280 308 377    BNP: BNP (last 3 results) Recent Labs    05/22/21 0249 05/25/21 0238  BNP 79.1 87.6     CBG: Recent Labs  Lab 05/30/21 0608 05/30/21 1138 05/30/21 1616 05/30/21 2211 05/31/21 0612  GLUCAP 145* 177* 166* 165* 157*     IMAGING STUDIES CT ANGIO HEAD NECK W WO CM  Result Date: 05/21/2021 CLINICAL DATA:  Acute neurologic deficit EXAM: CT ANGIOGRAPHY HEAD AND NECK TECHNIQUE: Multidetector CT imaging of the head and neck was performed using the standard protocol during bolus administration of intravenous contrast. Multiplanar CT image reconstructions and MIPs were obtained to evaluate the vascular anatomy. Carotid stenosis measurements (when applicable) are obtained utilizing NASCET criteria, using the distal internal carotid diameter as the denominator. CONTRAST:  29mL ISOVUE-370 IOPAMIDOL (ISOVUE-370) INJECTION 76% COMPARISON:  None. FINDINGS: CT HEAD FINDINGS Brain: There is no mass, hemorrhage or extra-axial collection. The size and configuration of the ventricles and extra-axial CSF spaces are normal. Area of hypoattenuation in within the pericallosal right frontal lobe is  slightly increased in size. The brain parenchyma is normal. Skull: The visualized skull base, calvarium and extracranial soft tissues are normal. Sinuses/Orbits: No fluid levels or advanced mucosal thickening of the visualized paranasal sinuses. No mastoid or middle ear effusion. The orbits are normal. CTA NECK FINDINGS  SKELETON: There is no bony spinal canal stenosis. No lytic or blastic lesion. OTHER NECK: Normal pharynx, larynx and major salivary glands. No cervical lymphadenopathy. Unremarkable thyroid gland. UPPER CHEST: No pneumothorax or pleural effusion. No nodules or masses. AORTIC ARCH: There is calcific atherosclerosis of the aortic arch. There is no aneurysm, dissection or hemodynamically significant stenosis of the visualized portion of the aorta. Conventional 3 vessel aortic branching pattern. The visualized proximal subclavian arteries are widely patent. RIGHT CAROTID SYSTEM: No dissection, occlusion or aneurysm. There is mixed density atherosclerosis extending into the proximal ICA, resulting in 50% stenosis. LEFT CAROTID SYSTEM: No dissection, occlusion or aneurysm. Mild atherosclerotic calcification at the carotid bifurcation without hemodynamically significant stenosis. VERTEBRAL ARTERIES: Left dominant configuration. Both origins are clearly patent. There is no dissection, occlusion or flow-limiting stenosis to the skull base (V1-V3 segments). CTA HEAD FINDINGS POSTERIOR CIRCULATION: --Vertebral arteries: Normal V4 segments. --Inferior cerebellar arteries: Normal. --Basilar artery: Normal. --Superior cerebellar arteries: Normal. --Posterior cerebral arteries (PCA): Unchanged severe stenosis at the right PCA P1-P2 junction and unchanged occlusion of the left PCA. ANTERIOR CIRCULATION: --Intracranial internal carotid arteries: Atherosclerotic calcification of the internal carotid arteries at the skull base without hemodynamically significant stenosis. --Anterior cerebral arteries (ACA): Unchanged occlusion of the right anterior cerebral artery A2 segment. Left is normal. --Middle cerebral arteries (MCA): Moderate stenosis of the distal left MCA M1 segment is unchanged. Normal right MCA VENOUS SINUSES: As permitted by contrast timing, patent. ANATOMIC VARIANTS: None Review of the MIP images confirms the above  findings. IMPRESSION: 1. No emergent large vessel occlusion. 2. Unchanged occlusion of the right anterior cerebral artery A2 segment. 3. Unchanged severe stenosis at the right PCA P1-P2 junction and occlusion of the left PCA. 4. Moderate stenosis of the distal left MCA M1 segment, unchanged. 5. Slightly increased size of right frontal lobe subacute infarct. Aortic Atherosclerosis (ICD10-I70.0). Electronically Signed   By: Ulyses Jarred M.D.   On: 05/21/2021 00:09   CT ANGIO HEAD NECK W WO CM  Result Date: 05/13/2021 CLINICAL DATA:  Recent stroke, worsening altered mental status, concern for new stroke EXAM: CT ANGIOGRAPHY HEAD AND NECK TECHNIQUE: Multidetector CT imaging of the head and neck was performed using the standard protocol during bolus administration of intravenous contrast. Multiplanar CT image reconstructions and MIPs were obtained to evaluate the vascular anatomy. Carotid stenosis measurements (when applicable) are obtained utilizing NASCET criteria, using the distal internal carotid diameter as the denominator. CONTRAST:  69mL OMNIPAQUE IOHEXOL 350 MG/ML SOLN COMPARISON:  Brain MRI 05/10/2021 FINDINGS: CT HEAD FINDINGS Brain: There is hypodensity in the right frontal lobe white matter abutting the superior margin of the right lateral ventricle consistent with evolving subacute infarct as seen on prior MRI. There is no other evidence of acute infarct. There is no acute intracranial hemorrhage or extra-axial fluid collection. There is mild parenchymal volume loss with prominence of the extra-axial CSF spaces overlying the bilateral cerebral hemispheres. The ventricles are normal in size. There is no mass lesion.  There is no midline shift. Vascular: There is mild calcification of the intracranial ICAs. No dense vessel is seen. Skull: Normal. Negative for fracture or focal lesion. Sinuses: Postsurgical changes are seen in the sinuses. Orbits: Bilateral lens  implants are in place. The globes and  orbits are otherwise unremarkable. Review of the MIP images confirms the above findings CTA NECK FINDINGS Aortic arch: There is mild calcified atherosclerotic plaque in the aortic arch. The origins of the major branch vessels are patent. Right carotid system: The right common carotid artery is patent. There is calcified atherosclerotic plaque in the proximal right internal carotid artery resulting in up to approximately 40-50% stenosis. The distal right internal carotid artery is patent. The right external carotid artery is patent. There is no evidence of dissection or aneurysm. Left carotid system: The left common carotid artery is patent. There is mild calcified atherosclerotic plaque in the proximal left internal carotid artery without hemodynamically significant stenosis or occlusion. The distal left internal carotid artery is patent. The left external carotid artery is patent. There is no dissection or aneurysm. Vertebral arteries: The vertebral arteries are patent, without hemodynamically significant stenosis, occlusion, dissection, or aneurysm. Skeleton: There is mild multilevel degenerative change in the cervical spine. There is no visible canal hematoma. There is no acute osseous abnormality or aggressive osseous lesion. Other neck: The thyroid is heterogeneous with partially calcified nodules, the largest measuring up to 1.2 cm. The soft tissues are otherwise unremarkable. Upper chest: The imaged lung apices are clear. Review of the MIP images confirms the above findings CTA HEAD FINDINGS Anterior circulation: There is calcified atherosclerotic plaque in the bilateral intracranial ICAs resulting in up to moderate stenosis of the right ophthalmic segment and moderate stenosis of the left supraclinoid segment. There is a 2 mm inferiorly directed outpouching arising from the left supraclinoid segment (15-152). No definite vessel is seen emanating from the tip of this outpouching. The bilateral A1 segments are  patent. The anterior communicating artery is patent. The right A2 segment is occluded proximally without appreciable reconstitution of flow. The left ACA is patent throughout. There is an approximately 4 mm segment of moderate to severe stenosis of the left M1 segment. The distal left MCA branches are patent. The right M1 segment is patent. There is severe stenosis of an inferior M2 branch shortly after the bifurcation. The distal right MCA branches are patent. Posterior circulation: The V4 segments are patent. The basilar artery is patent. There is severe stenosis of the left P1 segment with occlusion at the level of the P1/P2 junction (12-144). There is diminutive reconstitution of flow in the distal left PCA (12-117). There is severe focal stenosis at the right P1/P2 junction just inferior to an adjacent traversing vein (12-142). There is reconstitution of flow after the stenosis with more mild multifocal irregularity of the distal branches. Venous sinuses: As permitted by contrast timing, patent. Anatomic variants: None. Review of the MIP images confirms the above findings IMPRESSION: 1. Evolving subacute infarct in the right frontal lobe periventricular white matter in the ACA distribution as seen on prior MRI. No evidence of new infarct or other acute intracranial pathology. 2. Occlusion of the right P2 segment shortly after the anterior communicating artery without appreciable reconstitution of flow. 3. Occlusion of the left PCA at the P1/P2 junction with diminutive reconstitution of flow distally. 4. Other intracranial atherosclerotic disease as above resulting in severe focal stenosis of the right PCA at the P1/P2 junction with reconstitution of flow distally, severe stenosis of an inferior right M2 branch, and moderate stenosis of the intracranial ICAs bilaterally. 5. Atherosclerotic plaque in the bilateral carotid bulbs resulting in approximately 40-50% stenosis on the right and no significant stenosis on  the left.  6. 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 These results were called by telephone at the time of interpretation on 05/13/2021 at 1:39 pm to provider [facility] , who verbally acknowledged these results. Electronically Signed   By: Valetta Mole M.D.   On: 05/13/2021 13:41   CT HEAD WO CONTRAST (5MM)  Result Date: 05/24/2021 CLINICAL DATA:  Stroke, follow-up.  Right ACA infarct. EXAM: CT HEAD WITHOUT CONTRAST TECHNIQUE: Contiguous axial images were obtained from the base of the skull through the vertex without intravenous contrast. COMPARISON:  CT head without contrast 05/24/2021. CTA head and neck 05/20/21 FINDINGS: Brain: Right ACA territory infarct again noted with involvement of the cingulate gyrus and right corpus callosum. No significant progression of infarct is present. No hemorrhage is present. Atrophy and white matter disease is otherwise stable. Basal ganglia are intact. Remote lacunar infarct of the left caudate head is stable. Brainstem and cerebellum are within normal limits. Vascular: Atherosclerotic calcifications are again noted within the cavernous internal carotid arteries and at the dural margin of the left vertebral artery. No hyperdense vessel is present. Skull: Calvarium is intact. No focal lytic or blastic lesions are present. No significant extracranial soft tissue lesion is present. Sinuses/Orbits: A polyp or noted mucous retention cyst is present inferiorly in the right maxillary sinus. Partial ethmoidectomies noted bilaterally. Focal vessel thickening is present in the inferomedial right sphenoid sinus. No fluid levels are present. The mastoid air cells are clear. Bilateral lens replacements are noted. Globes and orbits are otherwise unremarkable. IMPRESSION: 1. Stable appearance of right ACA territory infarct without significant progression of infarct or hemorrhage. 2. Stable atrophy and white matter disease. 3.  Remote lacunar infarct of the left caudate head is stable. 4. Atherosclerosis. Electronically Signed   By: San Morelle M.D.   On: 05/24/2021 16:34   CT HEAD WO CONTRAST (5MM)  Result Date: 05/15/2021 CLINICAL DATA:  Stroke, follow-up.  Altered mental status. EXAM: CT HEAD WITHOUT CONTRAST TECHNIQUE: Contiguous axial images were obtained from the base of the skull through the vertex without intravenous contrast. COMPARISON:  CT angiogram head/neck 05/13/2021. Brain MRI 05/10/2021. FINDINGS: Brain: Mild generalized cerebral atrophy. Redemonstrated subacute infarct within the medial right frontal lobe white matter and callosal body (ACA vascular territory). No significant mass effect. No evidence of hemorrhagic conversion. Background mild patchy and ill-defined hypoattenuation within the cerebral white matter, nonspecific but compatible with chronic small vessel ischemic disease. Redemonstrated chronic lacunar infarct within the left caudate head. No interval acute infarct is identified. No extra-axial fluid collection. No evidence of an intracranial mass. No midline shift. Vascular: No hyperdense vessel.  Atherosclerotic calcifications. Skull: Normal. Negative for fracture or focal lesion. Sinuses/Orbits: Visualized orbits show no acute finding. Scattered mild mucosal thickening and small volume fluid within the bilateral frontal, ethmoid and sphenoid sinuses. Mild mucosal thickening within the visualized bilateral maxillary sinuses. Superimposed small right maxillary sinus mucous retention cyst. IMPRESSION: Stable size of a known subacute white matter infarct within the medial right frontal lobe and callosal body (ACA vascular territory). No significant mass effect or evidence of hemorrhagic conversion. No CT evidence of an interval acute infarct. Redemonstrated chronic lacunar infarct within the left caudate head. Background mild chronic small vessel ischemic changes within the cerebral white matter.  Mild generalized cerebral atrophy. Paranasal sinus disease at the imaged levels, as described Electronically Signed   By: Kellie Simmering D.O.   On: 05/15/2021 17:21   MR BRAIN WO CONTRAST  Result Date: 05/29/2021 CLINICAL DATA:  Increased confusion. EXAM: MRI HEAD WITHOUT CONTRAST TECHNIQUE: Multiplanar, multiecho pulse sequences of the brain and surrounding structures were obtained without intravenous contrast. COMPARISON:  CT head without contrast 05/24/2021. MR head without contrast 0/28/22. CTA head and neck 05/20/2021 FINDINGS: Brain: Diffusion-weighted images demonstrate progression of the right ACA territory infarcts the on the cingulate gyrus. There is now involvement the medial right frontal area and superior frontal gyrus. A focus of restricted diffusion adjacent to the atrium of the right lateral ventricle is relatively stable measuring 12 mm. A new punctate focus of restricted diffusion is present in the lateral left hippocampus, evident on image 65 of series 5 and image 47 of series 7. T2 signal changes are noted within the areas of restricted diffusion. Remote lacunar infarct in the left caudate head is again noted. Dilated perivascular spaces are present throughout the basal ganglia. White matter changes extend into the brainstem are stable. Vascular: Flow is present in the major intracranial arteries. Skull and upper cervical spine: The craniocervical junction is normal. Upper cervical spine is within normal limits. Marrow signal is unremarkable. Sinuses/Orbits: A polyp or retention cyst is present in the right maxillary sinus. Mucosal thickening is present the anterior ethmoid air cells bilaterally. Mild mucosal thickening is present in the right sphenoid sinus. Small mastoid effusions are present. No obstructing nasopharyngeal lesion is present. Bilateral lens replacements are noted. Globes and orbits are otherwise unremarkable. IMPRESSION: 1. Progression of right ACA territory infarcts involving  the cingulate gyrus and medial right frontal area. 2. New punctate focus of restricted diffusion in the lateral left hippocampus. This may represent a small infarct. 3. Stable atrophy and white matter disease. 4. Remote lacunar infarct of the left caudate head. These results were called by telephone at the time of interpretation on 05/29/2021 at 11:05am to provider K. Starla Link, who verbally acknowledged these results. Electronically Signed   By: San Morelle M.D.   On: 05/29/2021 11:11   MR BRAIN WO CONTRAST  Result Date: 05/24/2021 CLINICAL DATA:  Stroke follow-up EXAM: MRI HEAD WITHOUT CONTRAST TECHNIQUE: Multiplanar, multiecho pulse sequences of the brain and surrounding structures were obtained without intravenous contrast. COMPARISON:  05/10/2021 FINDINGS: Brain: Progressive restricted diffusion in the parasagittal right frontal lobe, ACA distribution and attributed to infarct. 9 mm acute infarct lateral to the atrium of the right lateral ventricle. No acute hemorrhage, hydrocephalus, or collection. Vascular: Absent left transverse sigmoid sinus flow void on axial T2 weighted imaging, transient when correlated with coronal T2 weighted imaging. Skull and upper cervical spine: Normal marrow signal Sinuses/Orbits: Cataract resection.  Mild left gaze deviation. IMPRESSION: Progressive acute infarction in the right ACA distribution. Small acute infarct in the right periatrial white matter. Electronically Signed   By: Jorje Guild M.D.   On: 05/24/2021 05:52   MR BRAIN W WO CONTRAST  Result Date: 05/10/2021  [facility] [address], [place], [place] [address] [id number] NEUROIMAGING REPORT STUDY DATE: 05/10/2021 PATIENT NAME: ALLIRA BUXBAUM DOB: [birth year] MRN: [id number] ORDERING CLINICIAN: : Dr Billey Gosling CLINICAL HISTORY: 84 year patient with memory loss COMPARISON FILMS: none EXAM: MRI Brain w/wo TECHNIQUE: Sagittal T1, axial DWI, ADC map, GRE, T1, FLAIR T2, coronal T2 and  postcontrast axial and coronal T1 images were obtained. CONTRAST: IV Multi[facility] IMAGING SITE: [facility] FINDINGS: The brain parenchyma shows area of diffusion positivity with slight impaired ADC signal in the right parasagittal frontal region affecting corpus callosum which likely late acute to early  subacute infarct.  There are mild changes of chronic small vessel disease and there are prominent changes of generalized cerebral atrophy which is slightly age disproportionate.  No other structural lesion or tumor is noted.  Extra-axial brain structures appear normal.  Calvarium shows no abnormalities.  Orbits appear unremarkable paranasal sinuses show mild chronic inflammatory changes with small mucous retention cyst/polyp in the right maxillary antrum.  The pituitary gland and cerebellar tonsils appear normal.  Visualized portion of the cervical spine show mild degenerative changes.  Flow-voids of large vessel intracranial circulation appear to be patent in the right vertebral artery appears to be hypoplastic.Post postcontrast images do not result in abnormal areas of enhancement.   Abnormal MRI scan of the brain showing late acute right frontal parasagittal infarct.  There are moderate changes of chronic small vessel disease as well as generalized cerebral atrophy.  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  Post postcontrast images do not result in abnormal areas of enhancement. INTERPRETING PHYSICIAN: Antony Contras, MD Certified in  Neuroimaging by [employer] and [employer]  EP PPM/ICD IMPLANT  Result Date: 05/17/2021 SURGEON:  Allegra Lai, MD   PREPROCEDURE DIAGNOSIS:  Cryptogenic Stroke   POSTPROCEDURE DIAGNOSIS:  Cryptogenic Stroke    PROCEDURES:  1. Implantable loop recorder implantation   INTRODUCTION:  JANUARY ALSBROOK is a 81 y.o. female with a history of unexplained stroke who presents today for implantable loop  implantation.  The patient has had a cryptogenic stroke.  Despite an extensive workup by neurology, no reversible causes have been identified.  she has worn telemetry during which she did not have arrhythmias.  There is significant concern for possible atrial fibrillation as the cause for the patients stroke.  The patient therefore presents today for implantable loop implantation.   DESCRIPTION OF PROCEDURE:  Informed written consent was obtained, and the patient was brought to the electrophysiology lab in a fasting state.  The patient required no sedation for the procedure today.  Mapping over the patient's chest was performed by the EP lab staff to identify the area where electrograms were most prominent for ILR recording.  This area was found to be the left parasternal region over the 3rd-4th intercostal space. The patients left chest was therefore prepped and draped in the usual sterile fashion by the EP lab staff. The skin overlying the left parasternal region was infiltrated with lidocaine for local analgesia.  A 0.5-cm incision was made over the left parasternal region over the 3rd intercostal space.  A subcutaneous ILR pocket was fashioned using a combination of sharp and blunt dissection.  A Medtronic Reveal Linq model [place] [id number] implantable loop recorder was then placed into the pocket  R waves were very prominent and measured 0.19mV. EBL<1 ml.  Steri- Strips and a sterile dressing were then applied.  There were no early apparent complications.   CONCLUSIONS:  1. Successful implantation of a Medtronic Reveal LINQ implantable loop recorder for cryptogenic stroke  2. No early apparent complications.   DG Chest Port 1 View  Result Date: 05/19/2021 CLINICAL DATA:  Cough, shortness of breath, COVID-19 EXAM: PORTABLE CHEST 1 VIEW COMPARISON:  03/20/2018 FINDINGS: Lungs are clear.  No pleural effusion or pneumothorax. The heart is normal in size. Thoracic aortic atherosclerosis. IMPRESSION: No  evidence of acute cardiopulmonary disease. Electronically Signed   By: Julian Hy M.D.   On: 05/19/2021 13:43   EEG adult  Result Date: 05/29/2021 Lora Havens, MD  05/29/2021  2:57 PM Patient Name: BLONDINA TOMAYKO MRN: [id number] Epilepsy Attending: Lora Havens Referring Physician/Provider: Dr Antony Contras Date: 05/29/2021 Duration: 22.06 mins Patient history: 81yo F with ams. EEG to evaluate for seizure Level of alertness: Awake AEDs during EEG study: None Technical aspects: This EEG study was done with scalp electrodes positioned according to the 10-20 International system of electrode placement. Electrical activity was acquired at a sampling rate of 500Hz  and reviewed with a high frequency filter of 70Hz  and a low frequency filter of 1Hz . EEG data were recorded continuously and digitally stored. Description: EEG showed continuous low amplitude 3-5hz  theta-delta slowing in right hemisphere admixed with sharp transients in right posterior quadrant. There is also 5 to 7 Hz theta slowing in left hemisphere admixed with intermittent 2-3hz  delta slowing. Hyperventilation and photic stimulation were not performed.   ABNORMALITY - Continuous slow, generalized and lateralized right hemisphere IMPRESSION: This study is suggestive of cortical dysfunction arising from right hemisphere likely secondary to underlying structural abnormality. Additionally there is moderate diffuse encephalopathy, nonspecific etiology. No seizures or definite epileptiform discharges were seen throughout the recording. If suspicion for ictal- interictal activity remains a concern, a prolonged study can be considered. Lora Havens   ECHOCARDIOGRAM COMPLETE BUBBLE STUDY  Result Date: 05/14/2021    ECHOCARDIOGRAM REPORT   Patient Name:   JALEEAH WEERTS Date of Exam: 05/14/2021 Medical Rec #:  [id number]     Height:       62.0 in Accession #:    [id number]    Weight:       134.5 lb Date of Birth:  04/13/1941      BSA:           1.615 m Patient Age:    89 years      BP:           157/72 mmHg Patient Gender: F             HR:           83 bpm. Exam Location:  Inpatient Procedure: 2D Echo and Saline Contrast Bubble Study Indications:    stroke  History:        Patient has prior history of Echocardiogram examinations, most                 recent 02/29/2016. Risk Factors:Hypertension, Diabetes and                 Dyslipidemia.  Sonographer:    Johny Chess RDCS Referring Phys: [id number] [place]  1. Left ventricular ejection fraction, by estimation, is 70 to 75%. The left ventricle has hyperdynamic function. The left ventricle has no regional wall motion abnormalities. Left ventricular diastolic parameters are consistent with Grade I diastolic dysfunction (impaired relaxation). Elevated left ventricular end-diastolic pressure.  2. Right ventricular systolic function is normal. The right ventricular size is normal. There is normal pulmonary artery systolic pressure.  3. The mitral valve is normal in structure. Trivial mitral valve regurgitation.  4. The aortic valve is grossly normal. Aortic valve regurgitation is not visualized. Mild aortic valve stenosis.  5. Agitated saline contrast bubble study was negative, with no evidence of any interatrial shunt. FINDINGS  Left Ventricle: Left ventricular ejection fraction, by estimation, is 70 to 75%. The left ventricle has hyperdynamic function. The left ventricle has no regional wall motion abnormalities. The left ventricular internal cavity size was small. There is no  left ventricular hypertrophy. Left ventricular diastolic parameters  are consistent with Grade I diastolic dysfunction (impaired relaxation). Elevated left ventricular end-diastolic pressure. Right Ventricle: The right ventricular size is normal. Right vetricular wall thickness was not well visualized. Right ventricular systolic function is normal. There is normal pulmonary artery systolic pressure. The tricuspid  regurgitant velocity is 2.64 m/s, and with an assumed right atrial pressure of 3 mmHg, the estimated right ventricular systolic pressure is 123456 mmHg. Left Atrium: Left atrial size was normal in size. Right Atrium: Right atrial size was normal in size. Pericardium: There is no evidence of pericardial effusion. Mitral Valve: The mitral valve is normal in structure. Trivial mitral valve regurgitation. Tricuspid Valve: The tricuspid valve is grossly normal. Tricuspid valve regurgitation is trivial. Aortic Valve: The aortic valve is grossly normal. Aortic valve regurgitation is not visualized. Mild aortic stenosis is present. Aortic valve mean gradient measures 11.5 mmHg. Aortic valve peak gradient measures 20.9 mmHg. Aortic valve area, by VTI measures 2.68 cm. Pulmonic Valve: The pulmonic valve was normal in structure. Pulmonic valve regurgitation is not visualized. Aorta: The aortic root and ascending aorta are structurally normal, with no evidence of dilitation. IAS/Shunts: No atrial level shunt detected by color flow Doppler. Agitated saline contrast was given intravenously to evaluate for intracardiac shunting. Agitated saline contrast bubble study was negative, with no evidence of any interatrial shunt.  LEFT VENTRICLE PLAX 2D LVIDd:         3.60 cm   Diastology LVIDs:         2.10 cm   LV e' medial:    7.94 cm/s LV PW:         0.80 cm   LV E/e' medial:  17.3 LV IVS:        0.90 cm   LV e' lateral:   9.03 cm/s LVOT diam:     1.90 cm   LV E/e' lateral: 15.2 LV SV:         113 LV SV Index:   70 LVOT Area:     2.84 cm  RIGHT VENTRICLE             IVC RV S prime:     13.80 cm/s  IVC diam: 1.20 cm TAPSE (M-mode): 1.9 cm LEFT ATRIUM             Index        RIGHT ATRIUM           Index LA diam:        3.70 cm 2.29 cm/m   RA Area:     11.80 cm LA Vol (A2C):   50.2 ml 31.08 ml/m  RA Volume:   27.00 ml  16.72 ml/m LA Vol (A4C):   52.8 ml 32.69 ml/m LA Biplane Vol: 52.8 ml 32.69 ml/m  AORTIC VALVE AV Area (Vmax):     2.51 cm AV Area (Vmean):   2.57 cm AV Area (VTI):     2.68 cm AV Vmax:           228.50 cm/s AV Vmean:          158.000 cm/s AV VTI:            0.422 m AV Peak Grad:      20.9 mmHg AV Mean Grad:      11.5 mmHg LVOT Vmax:         202.00 cm/s LVOT Vmean:        143.000 cm/s LVOT VTI:          0.399 m LVOT/AV  VTI ratio: 0.95  AORTA Ao Root diam: 2.70 cm Ao Asc diam:  2.70 cm MITRAL VALVE                TRICUSPID VALVE MV Area (PHT): 4.29 cm     TR Peak grad:   27.9 mmHg MV Decel Time: 177 msec     TR Vmax:        264.00 cm/s MV E velocity: 137.00 cm/s MV A velocity: 201.00 cm/s  SHUNTS MV E/A ratio:  0.68         Systemic VTI:  0.40 m                             Systemic Diam: 1.90 cm Mertie Moores MD Electronically signed by Mertie Moores MD Signature Date/Time: 05/14/2021/11:59:59 AM    Final     DISCHARGE EXAMINATION: Vitals:   05/30/21 1940 05/31/21 0006 05/31/21 0410 05/31/21 0912  BP: (!) 177/86 118/76 (!) 177/90 (!) 164/89  Pulse: 60 94 63 94  Resp: 16 18 17 16   Temp: 97.6 F (36.4 C) 97.6 F (36.4 C) 98.8 F (37.1 C) 97.7 F (36.5 C)  TempSrc: Axillary Axillary Axillary Oral  SpO2: 98% 98% 98% 99%  Weight:      Height:       General appearance: Awake alert.  In no distress.  Distracted Resp: Clear to auscultation bilaterally.  Normal effort Cardio: S1-S2 is normal regular.  No S3-S4.  No rubs murmurs or bruit GI: Abdomen is soft.  Nontender nondistended.  Bowel sounds are present normal.  No masses organomegaly    DISPOSITION: SNF  Discharge Instructions     Ambulatory referral to Neurology   Complete by: As directed    Follow up with Dr. Billey Gosling at [facility] in 4-6 weeks.  Patient is Dr. Georgina Peer patient. Thanks.   Call MD for:  difficulty breathing, headache or visual disturbances   Complete by: As directed    Call MD for:  extreme fatigue   Complete by: As directed    Call MD for:  hives   Complete by: As directed    Call MD for:  persistant dizziness or light-headedness    Complete by: As directed    Call MD for:  persistant nausea and vomiting   Complete by: As directed    Call MD for:  severe uncontrolled pain   Complete by: As directed    Call MD for:  temperature >100.4   Complete by: As directed    Diet - low sodium heart healthy   Complete by: As directed    Diet Carb Modified   Complete by: As directed    Discharge instructions   Complete by: As directed    Discharge instructions for diabetes mellitus: Check blood sugar 3 times a day and bedtime at home. If blood sugar running above 200 or less than 70 please call your MD to adjust insulin. If you notice signs and symptoms of hypoglycemia (low blood sugar) like jitteriness, confusion, thirst, tremor and sweating, please check blood sugar, drink sugary drink/biscuits/sweets to increase sugar level and call MD or return to ER.    Discharge instructions for CHF Check weight daily -preferably same time every day. Restrict fluid intake to 1200 ml daily Restrict salt intake to less than 2 g daily. Call MD if you have one of the following symptoms 1) 3 pound weight gain in 24 hours or 5 pounds in 1  week  2) swelling in the hands, feet or stomach  3) progressive shortness of breath 4) if you have to sleep on extra pillows at night in order to breathe     General discharge instructions:  Follow with Primary MD Leonard Downing, MD in 7 days   Get CBC/BMP checked in next visit within 1 week by PCP or SNF MD. (We routinely change or add medications that can affect your baseline labs and fluid status, therefore we recommend that you get the mentioned basic workup next visit with your PCP, your PCP may decide not to get them or add new tests based on their clinical decision)  On your next visit with your PCP, please get your medicines reviewed and adjusted.  Please request your PCP  to go over all hospital tests, procedures, radiology results at the follow up, please get all Hospital records  sent to your PCP by signing hospital release before you go home.  Activity: As tolerated with Full fall precautions use walker/cane & assistance as needed  Avoid using any recreational substances like cigarette, tobacco, alcohol, or non-prescribed drug.  If you experience worsening of your admission symptoms, develop shortness of breath, life threatening emergency, suicidal or homicidal thoughts you must seek medical attention immediately by calling 911 or calling your MD immediately  if symptoms less severe.  You must read complete instructions/literature along with all the possible adverse reactions/side effects for all the medicines you take and that have been prescribed to you. Take any new medicine only after you have completely understood and accepted all the possible adverse reactions/side effects.   Do not drive, operate heavy machinery, perform activities at heights, swimming or participation in water activities or provide baby sitting services if your were admitted for syncope or siezures until you have seen by Primary MD or a Neurologist and advised to do so again.  Do not drive when taking Pain medications.  Do not take more than prescribed Pain, Sleep and Anxiety Medications  Wear Seat belts while driving.  Please note You were cared for by a hospitalist during your hospital stay. If you have any questions about your discharge medications or the care you received while you were in the hospital after you are discharged, you can call the unit and asked to speak with the hospitalist on call if the hospitalist that took care of you is not available. Once you are discharged, your primary care physician will handle any further medical issues. Please note that NO REFILLS for any discharge medications will be authorized once you are discharged, as it is imperative that you return to your primary care physician (or establish a relationship with a primary care physician if you do not have one)  for your aftercare needs so that they can reassess your need for medications and monitor your lab values.   Increase activity slowly   Complete by: As directed           Allergies as of 05/31/2021       Reactions   Carvedilol Other (See Comments)   Dizziness   Lipitor [atorvastatin Calcium] Other (See Comments)   Pt doesn't recall   Loratadine Other (See Comments)   Unknown reaction   Metformin And Related Other (See Comments)   Unknown reaction   Pravachol Other (See Comments)   Unknown reaction   Zocor [simvastatin] Other (See Comments)   Unknown reaction   Metoprolol Other (See Comments)   Pt states metoprolol made her "very tired."  Medication List     STOP taking these medications    losartan 100 MG tablet Commonly known as: COZAAR       TAKE these medications    acetaminophen 325 MG tablet Commonly known as: TYLENOL Take 2 tablets (650 mg total) by mouth every 6 (six) hours as needed for mild pain (or Fever >/= 101).   aspirin 325 MG EC tablet Take 1 tablet (325 mg total) by mouth daily for 21 days. What changed:  medication strength how much to take when to take this additional instructions   clopidogrel 75 MG tablet Commonly known as: Plavix Take 1 tablet (75 mg total) by mouth daily.   donepezil 10 MG tablet Commonly known as: ARICEPT Take 10 mg by mouth 2 (two) times daily.   ezetimibe 10 MG tablet Commonly known as: Zetia Take 1 tablet (10 mg total) by mouth daily.   feeding supplement (GLUCERNA SHAKE) Liqd Take 237 mLs by mouth 3 (three) times daily between meals.   glipiZIDE 10 MG 24 hr tablet Commonly known as: GLUCOTROL XL Take 20 mg by mouth every morning.   hydrALAZINE 25 MG tablet Commonly known as: APRESOLINE Take 1 tablet (25 mg total) by mouth 3 (three) times daily as needed (give for SBP greater than 175.).   memantine 10 MG tablet Commonly known as: Namenda Take 1/2 tab at night for one week. Then take 1/2 tab  in the morning and night for one week. Then take 1/2 tab in the morning and 1 tab at night for one week. Then take 1 tab twice a day   nitrofurantoin (macrocrystal-monohydrate) 100 MG capsule Commonly known as: MACROBID Take 1 capsule (100 mg total) by mouth at bedtime.   pioglitazone 30 MG tablet Commonly known as: ACTOS Take 30 mg by mouth every morning.   polyethylene glycol 17 g packet Commonly known as: MIRALAX / GLYCOLAX Take 17 g by mouth daily as needed for moderate constipation.   rosuvastatin 5 MG tablet Commonly known as: CRESTOR Take 1 tablet (5 mg total) by mouth daily.   Synthroid 100 MCG tablet Generic drug: levothyroxine Take 1 tablet (100 mcg total) by mouth daily. What changed: when to take this   tamsulosin 0.4 MG Caps capsule Commonly known as: FLOMAX Take 1 capsule (0.4 mg total) by mouth daily.          Contact information for follow-up providers     [facility]. Schedule an appointment as soon as possible for a visit in 1 week(s).   Specialty: Rehabilitation Contact information: [address] [id number] [place] [id number] [phone]        Kaleen MaskElkins, Wilson Oliver, MD Follow up.   Specialty: Family Medicine Contact information: [address] [place] [address] [phone]         [facility] Follow up.   Why: In a week for voiding trial Contact information: [address] [place] [id number] [phone]        Ocie Doynehima, Jennifer, MD. Schedule an appointment as soon as possible for a visit in 1 month(s).   Specialty: Neurology Contact information: [address] [place] [address] [phone]              Contact information for after-discharge care     Destination     [facility] Preferred SNF .   Service: Skilled Nursing Contact information: [address] [place] [phone]                     TOTAL DISCHARGE TIME: 35 minutes  Willey  [facility] Pager on [email or web address]  05/31/2021, 10:04 AM

## 2021-06-16 ENCOUNTER — Telehealth: Payer: Self-pay

## 2021-06-28 NOTE — Telephone Encounter (Signed)
LINQ alert received.  4 AF events starting 1/20 01:50.  Ongoing event starting 02:52 EGM's irregular tachy with what appears to be runs of NSVT with some undersensing HR's 90% binned >100, histogram peak 140-150 DAPT, no OAC  Reviewed with Dr. Curt Bears, AF confirm he advised AF clinic referral.    Spoke with patient daughter Delrae Rend (DPR on file).  She states patient is currently in SNF actively dying.  The facility has indicated they expect death to be imminent as patient condition has worsened.

## 2021-06-28 DEATH — deceased

## 2021-07-05 ENCOUNTER — Inpatient Hospital Stay: Payer: Medicare Other | Admitting: Psychiatry

## 2021-10-17 ENCOUNTER — Ambulatory Visit: Payer: Medicare Other | Admitting: Psychiatry

## 2022-06-14 IMAGING — MR MR HEAD W/O CM
12 of 13 series · 43 of 48 positions shown · non-contrast
Comparison: CT head without contrast 05/24/2021. MR head without
contrast [DATE]. CTA head and neck 05/20/2021

CLINICAL DATA: Increased confusion.

EXAM:
MRI HEAD WITHOUT CONTRAST
TECHNIQUE: Multiplanar, multiecho pulse sequences of the brain and surrounding
structures were obtained without intravenous contrast.

[Series 5: DWI · axial · 3.0mm · 0.88mm/px · z∈[-49,+71]mm · 6 of 96 slices shown (1 of 4)]
[im 1/96]
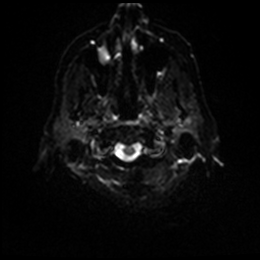
[im 20/96]
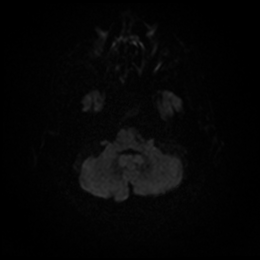
[im 39/96]
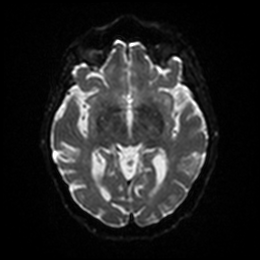
[im 58/96]
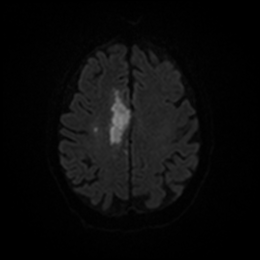
[im 77/96]
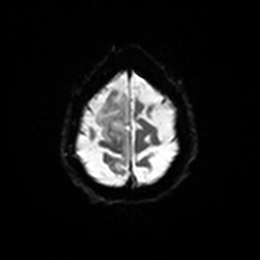
[im 96/96]
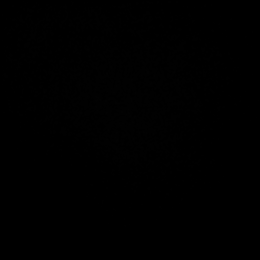

[Series 6: DWI · axial · 3.0mm · 0.88mm/px · z∈[-49,+69]mm · 4 of 47 slices shown (2 of 4)]
[im 1/47]
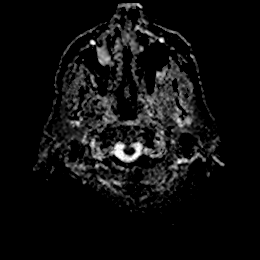
[im 16/47]
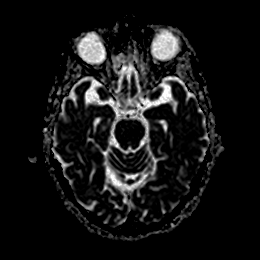
[im 31/47]
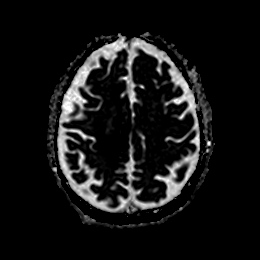
[im 47/47]
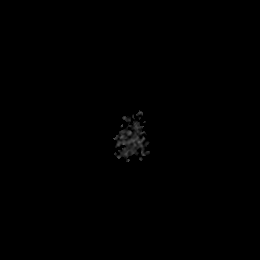

[Series 7: DWI · coronal · 4.0mm · 0.88mm/px · 5 of 64 slices shown (3 of 4)]
[im 1/64]
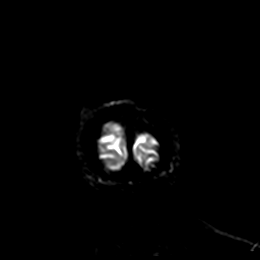
[im 16/64]
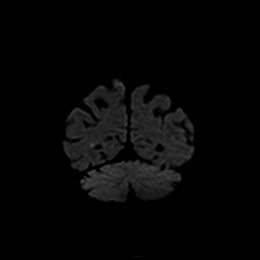
[im 32/64]
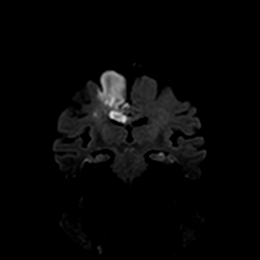
[im 48/64]
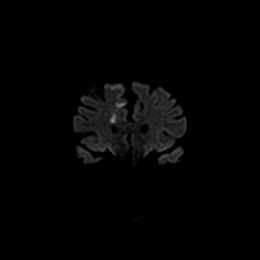
[im 64/64]
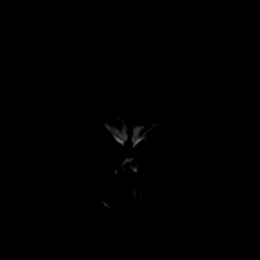

[Series 8: DWI · coronal · 4.0mm · 0.88mm/px · 2 of 32 slices shown (4 of 4)]
[im 1/32]
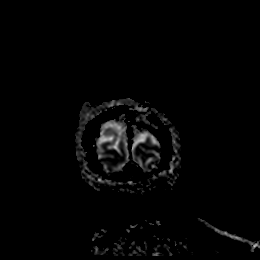
[im 32/32]
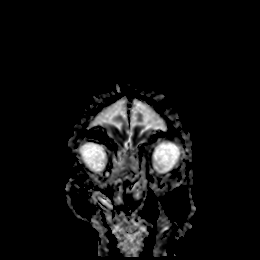

[Series 9: T1 · sagittal · 5.0mm · 0.75mm/px · 2 of 23 slices shown]
[im 1/23]
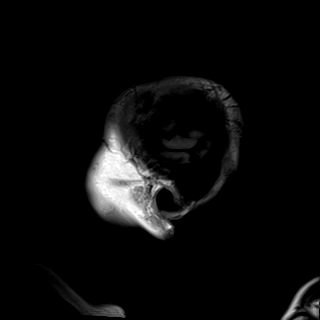
[im 23/23]
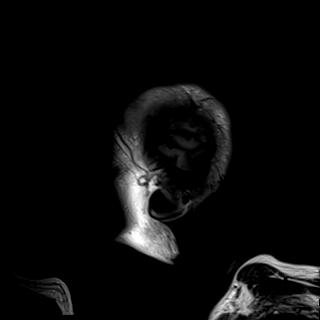

[Series 10: T2 · axial · 5.0mm · 0.72mm/px · z∈[-57,+69]mm · 2 of 25 slices shown]
[im 1/25]
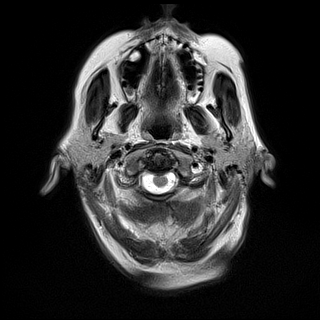
[im 25/25]
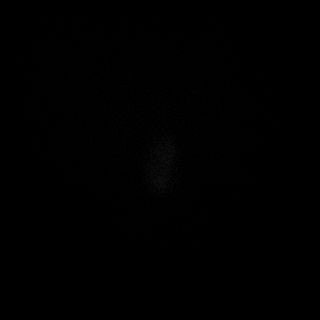

[Series 11: FLAIR · axial · 5.0mm · 0.45mm/px · z∈[-56,+70]mm · 2 of 25 slices shown]
[im 1/25]
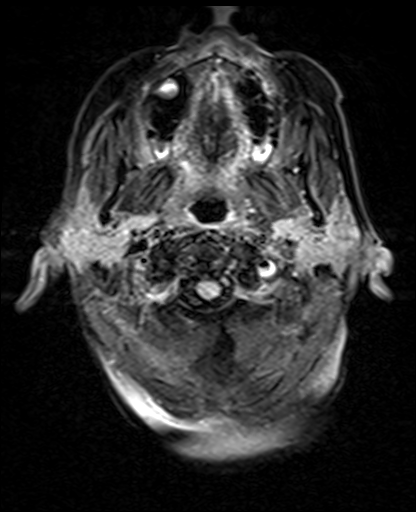
[im 25/25]
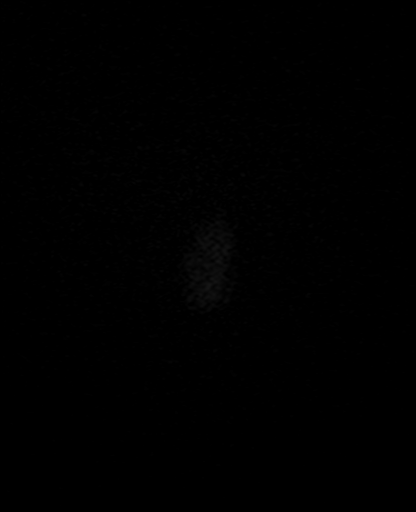

[Series 12: mag_images · axial · 3.0mm · 0.90mm/px · z∈[-63,+88]mm · 5 of 60 slices shown]
[im 1/60]
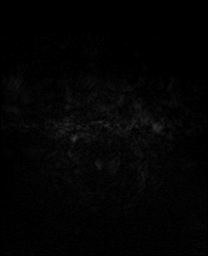
[im 15/60]
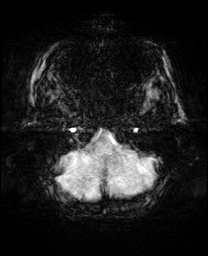
[im 30/60]
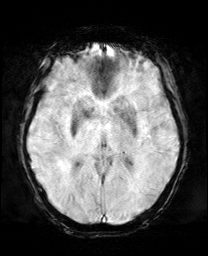
[im 45/60]
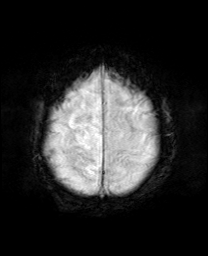
[im 60/60]
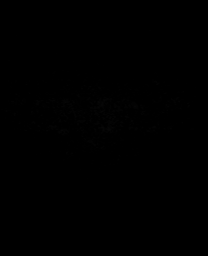

[Series 13: pha_images · axial · 3.0mm · 0.90mm/px · z∈[-56,+70]mm · 4 of 50 slices shown]
[im 1/50]
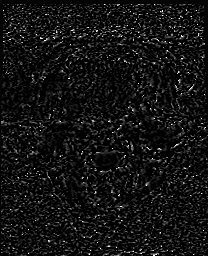
[im 17/50]
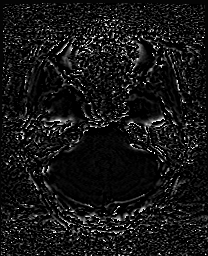
[im 33/50]
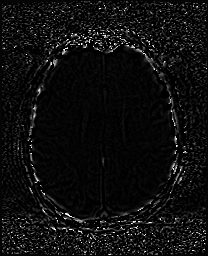
[im 50/50]
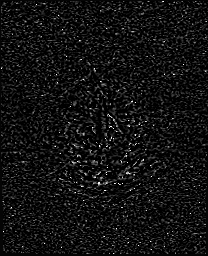

[Series 14: swi_images · axial · 3.0mm · 0.90mm/px · z∈[-63,+88]mm · 5 of 60 slices shown]
[im 1/60]
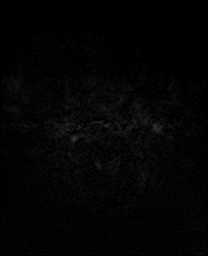
[im 15/60]
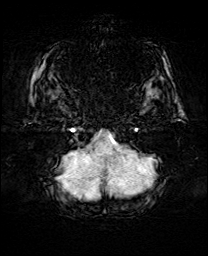
[im 30/60]
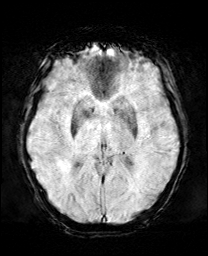
[im 45/60]
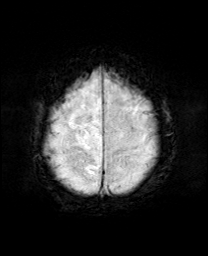
[im 60/60]
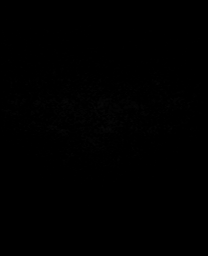

[Series 15: mip_images(sw) · axial · 24.0mm · 0.90mm/px · z∈[-54,+79]mm · 4 of 53 slices shown]
[im 1/53]
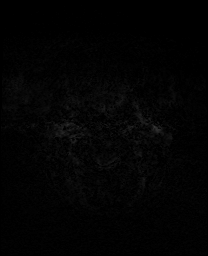
[im 18/53]
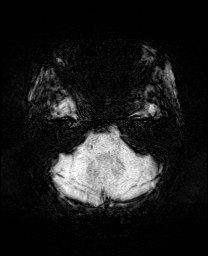
[im 35/53]
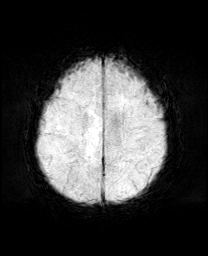
[im 53/53]
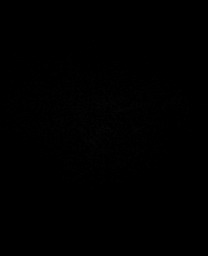

[Series 18: T2 post-contrast · coronal · 5.0mm · 0.72mm/px · 2 of 28 slices shown]
[im 1/28]
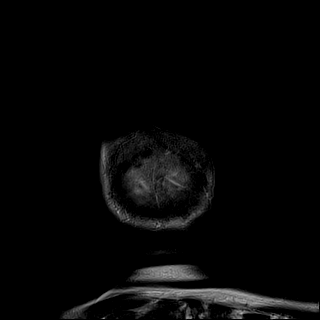
[im 28/28]
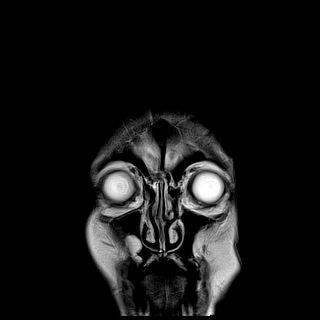

[43 of 48 positions shown; findings below may reference images not displayed]

FINDINGS: Brain: Diffusion-weighted images demonstrate progression of the
right ACA territory infarcts the on the cingulate gyrus. There is
now involvement the medial right frontal area and superior frontal
gyrus.

A focus of restricted diffusion adjacent to the atrium of the right
lateral ventricle is relatively stable measuring 12 mm. A new
punctate focus of restricted diffusion is present in the lateral
left hippocampus, evident on image 65 of series 5 and image 47 of
series 7.

T2 signal changes are noted within the areas of restricted
diffusion.

Remote lacunar infarct in the left caudate head is again noted.
Dilated perivascular spaces are present throughout the basal
ganglia. White matter changes extend into the brainstem are stable.

Vascular: Flow is present in the major intracranial arteries.

Skull and upper cervical spine: The craniocervical junction is
normal. Upper cervical spine is within normal limits. Marrow signal
is unremarkable.

Sinuses/Orbits: A polyp or retention cyst is present in the right
maxillary sinus. Mucosal thickening is present the anterior ethmoid
air cells bilaterally. Mild mucosal thickening is present in the
right sphenoid sinus. Small mastoid effusions are present. No
obstructing nasopharyngeal lesion is present. Bilateral lens
replacements are noted. Globes and orbits are otherwise
unremarkable.
IMPRESSION: 1. Progression of right ACA territory infarcts involving the
cingulate gyrus and medial right frontal area.
2. New punctate focus of restricted diffusion in the lateral left
hippocampus. This may represent a small infarct.
3. Stable atrophy and white matter disease.
4. Remote lacunar infarct of the left caudate head.

These results were called by telephone at the time of interpretation
on 05/29/2021 at [DATE] to provider Nya Jumper, who verbally
acknowledged these results.
